# Patient Record
Sex: Male | Born: 1945 | Race: White | Hispanic: No | Marital: Married | Smoking: Former smoker
Health system: Southern US, Community
[De-identification: ages and names within clinical notes are randomized; demographics above are authoritative.]

## PROBLEM LIST (undated history)

## (undated) DIAGNOSIS — I2510 Atherosclerotic heart disease of native coronary artery without angina pectoris: Secondary | ICD-10-CM

## (undated) DIAGNOSIS — E785 Hyperlipidemia, unspecified: Secondary | ICD-10-CM

## (undated) DIAGNOSIS — I10 Essential (primary) hypertension: Secondary | ICD-10-CM

## (undated) DIAGNOSIS — I6529 Occlusion and stenosis of unspecified carotid artery: Secondary | ICD-10-CM

## (undated) DIAGNOSIS — G4733 Obstructive sleep apnea (adult) (pediatric): Secondary | ICD-10-CM

## (undated) HISTORY — PX: CORONARY ARTERY BYPASS GRAFT: SHX141

## (undated) HISTORY — DX: Essential (primary) hypertension: I10

## (undated) HISTORY — DX: Hyperlipidemia, unspecified: E78.5

## (undated) HISTORY — DX: Obstructive sleep apnea (adult) (pediatric): G47.33

## (undated) HISTORY — DX: Occlusion and stenosis of unspecified carotid artery: I65.29

## (undated) HISTORY — DX: Atherosclerotic heart disease of native coronary artery without angina pectoris: I25.10

---

## 1999-08-26 ENCOUNTER — Encounter: Payer: Self-pay | Admitting: *Deleted

## 1999-08-26 ENCOUNTER — Inpatient Hospital Stay (HOSPITAL_COMMUNITY): Admission: EM | Admit: 1999-08-26 | Discharge: 1999-09-03 | Payer: Self-pay | Admitting: Emergency Medicine

## 1999-08-28 ENCOUNTER — Encounter: Payer: Self-pay | Admitting: *Deleted

## 1999-08-29 ENCOUNTER — Encounter: Payer: Self-pay | Admitting: Cardiothoracic Surgery

## 1999-08-30 ENCOUNTER — Encounter: Payer: Self-pay | Admitting: Cardiothoracic Surgery

## 1999-08-31 ENCOUNTER — Encounter: Payer: Self-pay | Admitting: Cardiothoracic Surgery

## 1999-09-01 ENCOUNTER — Encounter: Payer: Self-pay | Admitting: Cardiothoracic Surgery

## 1999-09-24 ENCOUNTER — Encounter (HOSPITAL_COMMUNITY): Admission: RE | Admit: 1999-09-24 | Discharge: 1999-12-23 | Payer: Self-pay | Admitting: Interventional Cardiology

## 2001-01-21 ENCOUNTER — Inpatient Hospital Stay (HOSPITAL_COMMUNITY): Admission: EM | Admit: 2001-01-21 | Discharge: 2001-01-22 | Payer: Self-pay | Admitting: Emergency Medicine

## 2001-02-11 ENCOUNTER — Ambulatory Visit (HOSPITAL_COMMUNITY): Admission: RE | Admit: 2001-02-11 | Discharge: 2001-02-11 | Payer: Self-pay | Admitting: *Deleted

## 2001-02-15 ENCOUNTER — Ambulatory Visit (HOSPITAL_COMMUNITY): Admission: RE | Admit: 2001-02-15 | Discharge: 2001-02-16 | Payer: Self-pay | Admitting: Interventional Cardiology

## 2003-05-04 ENCOUNTER — Ambulatory Visit (HOSPITAL_COMMUNITY): Admission: RE | Admit: 2003-05-04 | Discharge: 2003-05-04 | Payer: Self-pay | Admitting: *Deleted

## 2007-09-30 HISTORY — PX: CERVICAL DISC SURGERY: SHX588

## 2008-06-06 ENCOUNTER — Inpatient Hospital Stay (HOSPITAL_COMMUNITY): Admission: RE | Admit: 2008-06-06 | Discharge: 2008-06-07 | Payer: Self-pay | Admitting: Orthopedic Surgery

## 2008-06-06 ENCOUNTER — Encounter (INDEPENDENT_AMBULATORY_CARE_PROVIDER_SITE_OTHER): Payer: Self-pay | Admitting: Orthopedic Surgery

## 2011-02-11 NOTE — Op Note (Signed)
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   POSTOPERATIVE DIAGNOSIS:  Lumbar spondylosis/stenosis C5-6, C6-7 with  neck pain and right brachialgia.   OPERATIVE PROCEDURE:  Anterior decompression and fusion C5-6 and C6-7,  insertion of interbody cages with autograft [id number] and [id number], application  of titanium plate and screws [id number], C6-7; harvest left autogenous iliac  crest bone graft (anterior).   OPERATIVE NOTE:  The patient was placed under general endotracheal  anesthesia.  Intravenous antibiotics were infused prophylactically.  He  was positioned supine on a Jackson table.  The small bump was placed  under the shoulder blades to facilitate extension of the neck.  The head  was supported on foam donut and foam slab.  The arms were tucked at the  sides.  Hair was clipped from the lower abdomen on the left side.  Tincture of Benzoin was applied to the tops of both shoulders and taped,  then attached and anchored distally to the table to depress both  shoulders.  This was done to facilitate cross-table lateral radiographs.   The left anterior iliac crest and anterior cervical spine were prepped  with DuraPrep and draped in sterile fashion.  The drapes were secured  with strips of Ioban.   A needle was taped to the skin of the cervical spine and a cross-table  lateral radiograph taken.  While waiting for the radiograph to be  processed, we harvested the left anterior iliac crest graft.  A short  incision was made  just distal to the iliac crest about 2.5 inches  posterior to the posterior superior iliac spine.  The external oblique  insertion was identified into the iliac crest.  It was mobilized  medially.  Taylor retractor was placed medially and the periosteum  elevated laterally and another Taylor retractor placed laterally  essentially just the superior surface of the iliac crest was exposed.  Small gouge was used to fenestrate the crest and then the gouge used to  harvest a moderate quantity of bone graft, which was noted to be softer  than expected.  The bony defect was then packed with Gelfoam to stop  bone bleeding.   By this time, a radiograph had been developed and the incision was  appropriately positioned distal to the level marked by the needle, which  had been applied to the skin.  A transverse incision was made from  midline to the left.  It extended laterally to the anterior edge of the  sternocleidomastoid on the left side.  The platysma had separated in the  midline and had migrated laterally somewhat.  It was transected  in line  with the incision.  Deep cervical fascia was identified and bluntly  dissected down onto the anterior aspect of the spinal column.  The  carotid sheath was palpated laterally.   It what we proceed probably the C5-6 disk, the longus colli muscle was  mobilized and a spinal needle bent in Z-fashion was inserted and a cross-  table lateral radiograph taken, which showed it to be at C5-6.  We then  continued with mobilization of the longus coli muscle bilaterally from  the body of C7 distally and the body of C5 proximally.   A shadow line retractor was then placed at C5-6.  Caspar distraction  pins were placed above and below the distracting apparatus applied and a  small amount of tension placed on the disk.  The annulus was incised in  rectangular fashion and then the disk further enucleated using a  combination of Karlin curettes and rongeurs.    Posteriorly, we burred away the posterior superior corner of C6 and the  posterior inferior corner of C5.  A 4-0 curette was used to separate  annular fibers from the posterior aspect of both disks.  This allowed [place]  to place a 2-mm Kerrison rongeur in that plane to remove a little more  bone and then proceed into the neuroforamen laterally, particularly on  the right side, the symptomatic side.  The disk was inextricably bound  to the posterior longitudinal ligament, so that no separate plane was  ever identified.  However, we did decompressed back to the dura and  removed disk tissue from left to right and particular in the right  neuroforamen.  When palpation of the neuroforamen revealed no impediment  to the admission of a nerve hook, decompressive efforts were ceased.  A  small amount of bleeding was controlled by placing Gelfoam in the  diskectomy defect and leaving it for a few minutes.   We then chose a 6-mm cage which was packed with bone and inserted.  The  Caspar distraction device was released and the vertebral endplates  gripped the cage.   Retractors were then removed as were the Caspar pins and we moved to the  C6-7 level.  Shadow line retractor was placed to retract the longus coli  muscles bilaterally and the Caspar pins were then placed, using the  previously made hole at C6.  Again, anterior disk was incised and the  disk enucleated.  At C6-7, there was quite a lot of disk protruding  posterior to the posterior margins of the vertebra.  This was mobilized  really tediously, again separating soft tissue from bone using a small  angled curette.  Eventually, we cleared all of the annulus and adherent  posterior longitudinal ligament from left to right and the dura was well  decompressed.  There was a great deal of disks superiorly, presumably  confined by the posterior longitudinal ligament.  This was curetted  away.  Once again a meticulous foraminotomy was carried out  and  decompressive measures not ceased until I felt the neuroforamen was well  decompressed, particularly on the right side.  However, it was palpated  on the left as it was at C5-6 and it was felt to be adequately  decompressed there too.  An 8-mm cage was then chosen, packed with bone  graft and put in place.   We then applied a 42-mm titanium plate and attached it with 5 screws.  At this point, I noted that the vertebral body  bone was extremely soft  and had to used two rescue screws, one at the distal plate on the right  side (C7) and one proximally on the left side at C5.  The left C6 screw  stripped out and the hole was not amenable to the use of even a rescue  screw.   Cross-table lateral radiograph was taken, which showed good position of  the screws at C5 and C6, but the C7 level could not really be adequately  evaluated due to the shoulders overshadowing, which is not unusual in  this circumstance.   We then closed the subcutaneous layer of the bone graft harvest wound  with 0 Vicryl suture in inverted interrupted fashion.  The skin was  closed using continuous 3-0 undyed Vicryl in continuous fashion.  The  platysma was closed using inverted 0 Vicryl suture in interrupted  fashion.  The skin was closed using a running subcuticular 3-0 undyed  Vicryl.  The skin edges were reinforced with Steri-Strips.  Prior to  closing, a small Silastic drain was placed in the prevertebral area and  attached to a Jackson-Pratt type of reservoir.   Nonadherent antibiotic ointment dressing was applied to the iliac crest  wound.  A plain gauze dressing was applied to the cervical wound held in  place with a green towel.   At the time of dictation, the patient was not awakened and is in the  process of being awakened and transferred to his bed.  Therefore, no  neurologic examination is reported here.   The sponge and needle counts were correct.  There were no intraoperative   complications.      Dimas Alexandria, MD  Electronically Signed     MT/MEDQ  D:  06/06/2008  T:  06/07/2008  Job:  [id number]

## 2011-02-14 NOTE — Cardiovascular Report (Signed)
[place]. [facility]  Patient:    Erik Perez, Erik Perez                  MRN: [id number] Proc. Date: 02/11/01 Adm. Date:  [id number] Disc. Date: [id number] Attending:  Rutherford Limerick CC:         Priscille Heidelberg. Little, M.D.   Cardiac Catheterization  REFERRING PHYSICIAN:  Priscille Heidelberg. Little, M.D.  INDICATIONS:  Abnormal Cardiolite.  BRIEF HISTORY:  Erik Perez is a 65 year old gentleman, status post coronary artery bypass surgery in December 2000.  At that time, he underwent a left internal mammary to the left anterior descending coronary artery, right internal mammary to the right coronary artery, saphenous vein graft to D-1, saphenous vein graft to D-2.  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  On his Cardiolite, he exercised for 8 minutes and 1 second utilizing a full Bruce protocol.  The baseline ECG revealed normal sinus rhythm with normal R-wave progression.  He had 1 mm horizontal ST depression.  His exercise tolerance had decreased from 9 minutes and 44 seconds down to 8 minutes and 1 second. The spect images revealed a decreased uptake from the mid vessel approaching the apex and involving the inferior wall.  There was reverse redistribution noted.  The calculated ejection fraction had decreased from 55% down to 40%.  DESCRIPTION OF PROCEDURE:  After obtaining written informed consent, the patient was brought to the cardiac catheterization laboratory in the postabsorptive state.  Preoperative sedation was achieved using IV Versed. The right groin was prepped and draped in the usual sterile fashion.  Local anesthesia was achieved using 1% Xylocaine.  A 6-French hemostasis sheath was placed into the right femoral artery using the modified Seldinger technique. Selective coronary angiography was performed using JL-4 and JR-4 catheters. The saphenous vein grafts were engaged using a JL-4.   The left internal mammary artery was engaged using a JR-4.  Access to the right internal mammary artery was very tedious.  Exchange was made over guide wire to get the right groin area catheter up.  Unable to access the LIMA or the RIMA with the JR-4 LIMA or a no-torque catheter.  Multiple attempts were made.  Subclavian shots were made.  The ostium was identified, however, engagement of the Foyil was not made.  FINDINGS:  PRESSURES:  The aortic pressure was 134/80.  LV pressure 127/60.  EDP 10.  SINGLE-PLANE VENTRICULOGRAM:  Revealed anteroapical hypokinesis.  Ejection fraction was 50%.  CORONARY ARTERIES: 1. The left main coronary artery trifurcates into the left anterior descending    coronary artery, ramus, and circumflex vessels.  There was no significant    disease in the left main coronary artery.  2. The left anterior descending coronary artery was 100% occluded after the    first septal perforator.  The ramus was a small vessel and had no    significant disease.  3. The circumflex vessel was a large vessel.  It gave rise to several small    obtuse marginals and supplied the lateral portion of the myocardium.  There    was a long 70% proximal lesion in the circumflex.  There was poststenotic    dilatation noted.  4. The right coronary artery was dominant for the inferior circulation.  There    was a long proximal lesion up to 70% in the proximal right coronary artery.    There moderate sized PDA and PL branch  had no significant disease.  GRAFT ANGIOGRAPHY: 1. The saphenous vein graft to the second diagonal had good flow.  There was a    30% ostial stenosis.  There was retrograde flow back to the first diagonal    as well.  2. The saphenous vein graft to the first diagonal was patent, with good distal    runoff.  3. The left internal mammary artery was a small to moderate caliber vessel.    There was reverse flow back to the second diagonal noted from the left     internal mammary artery.  There was no significant disease noted in the    left internal mammary artery, however, there was contrast hang-up in the    system noted.  4. The right internal mammary artery was not engaged despite several attempts    using a JR-4 LIMA catheter and a no-torque catheter.  Injection into the    subclavian artery revealed that the ostium of the RIMA was patent, however,    distal runoff could not be noted.  The single-plane ventriculogram revealed anteroapical hypokinesis.  Ejection fraction was approximately 50%.  FINAL IMPRESSION: 1. As critical disease involving the proximal circumflex and proximal right,    the circumflex was unrevascularized at the time of surgery.  The right    internal mammary artery could not be engaged. 2. As there is preserved left ventricular function, the films will be reviewed    with Dr. Tamala Julian and further recommendations will be made pending the outcome    of the review. DD:  02/11/01 TD:  02/11/01 Job: [id number]

## 2011-02-14 NOTE — Cardiovascular Report (Signed)
[place]. [facility]  Patient:    Erik Perez                     MRN: [id number] Proc. Date: 08/27/99 Adm. Date:  [id number] Attending:  Rutherford Limerick CC:         Priscille Heidelberg. Little, M.D.             Jerrye Beavers, M.D., [facility]             CVTS.                        Cardiac Catheterization  CINE NUMBER:  [id number].  PROCEDURE PERFORMED:  Percutaneous transluminal coronary angioplasty of the proximal and mid left anterior descending.  DESCRIPTION OF PROCEDURE:  After informed consent, and following diagnostic cardiac catheterization performed by Fabio Asa, M.D., the patient underwent angioplasty using the 7-French sheath that was placed during the diagnostic procedure.  The LAD had been found to be totally occluded.  We used the 7-French #4 left Judkins guiding catheter, a BMW 190-cm long wire and an ACT was documented to be 247 seconds.  IV integrilin double bolus and infusion were also begun.  We were able to cross the stenosis with the BMW wire without much difficulty. low was reestablished with the guidewire.  It was noted at that time that there were multiple lesions in the LAD, including the proximal lesion and the mid lesion with diffuse disease.  Angioplasty was performed with a 3.0 20-mm long Adante balloon. Multiple inflations were performed and a dissection occurred in the mid lesion nd also in the proximal lesion, just beyond the first septal perforator.  We then attempted to stent with a 25-mm long NIR Royale stent that would not cross the stenosis.  We then placed a buddy wire using a 300-cm long luge wire and again attempted to cross but were unsuccessful, and due in part to poor guiding catheters.  We then switched over to a 15-mm long 3.0-mm S-670 stent, which was  also unable to cross into the mid-vessel.  After attempting stenting, we decided to terminate the procedure, as the patient still had patency of  the LAD.  Another series of balloon inflations were performed, each for two units in the mid and proximal vessel.  Patient left the cath lab with a patent LAD but dissections proximally, just distal to the first septal perforator, and also in the mid-vessel. Antegrade flow was TIMI grade 3.  Patient left the catheterization lab symptomatic, with no ST-T wave changes.  CONCLUSIONS:  Successful percutaneous transluminal coronary angioplasty of the eft anterior descending with resolution of ischemic chest pain and ST-T wave changes. Unable to stent the vessel due to diffuse extensive left anterior descending disease.  PLAN:  Coronary artery bypass graft.  CVTS is consulted.  Hopefully, surgery can be done electively in several days after the patient has opportunity to recover some from this acute anterior wall infarction. DD:  08/27/99 TD:  08/28/99 Job: [id number]

## 2011-02-14 NOTE — Op Note (Signed)
[place]. [facility]  Patient:    Erik Perez                     MRN: [id number] Proc. Date: 08/29/99 Adm. Date:  [id number] Attending:  Len Childs CC:         CVTS office             Lu Duffel, M.D.             at [facility] Card[facility]                           Operative Report  PREOPERATIVE DIAGNOSIS:  Severe two vessel coronary artery disease, status post  acute anterior wall myocardial infarction with emergency angioplasty of an occluded left anterior descending coronary artery with residual dissection, class IV unstable angina.  POSTOPERATIVE DIAGNOSIS:  Severe two vessel coronary artery disease, status post acute anterior wall myocardial infarction with emergency angioplasty of an occluded left anterior descending coronary artery with residual dissection, class IV unstable angina.  OPERATION:  Coronary artery bypass grafting x 4 (left internal mammary artery to LAD, right internal mammary artery to RCA, saphenous vein graft to first diagonal, saphenous vein graft to second diagonal).  SURGEON:  Len Childs, M.D.  ASSISTANT:  Earnstine Regal, P.A.C.  ANESTHESIOLOGIST:  Jessy Oto. Albertina Parr, M.D.  ANESTHESIA:  General.  INDICATION:  The patient is a 65 year old white male who presented with unstable angina.  While in the hospital, he developed acute anterior MI and was taken emergently to the catheter lab where the LAD was opened with angioplasty.  He had a 95% stenosis of the right coronary artery.  He was stabilized on anticoagulant medication and referred for surgical revascularization.  Prior to the operation, the patient was examined in his CC room and the results of his cardiac catheterization were discussed with the patient and family.  The indications and expected benefits of coronary artery bypass grafting were discussed.  I discussed the details of the operation including the choice of conduit  to include bilateral internal mammary artery graft due to his age, use of cardiopulmonary bypass, and general anesthesia and expected postoperative recovery.  I discussed with the family and patient the specific risks of the operation including the risks of MI, CVA, bleeding, infection, death.  He understood the implication for surgery and agreed to proceed with the operation as planned under informed consent.  FINDINGS:  Both mammary arteries were good conduits.  The saphenous vein was moderately dilated but adequate.  The LAD was 0 proximal stenosis and the vessel had some dissection continuing into the first diagonal.  The anterior apical LV  showed a recent hemorrhage infarct approximately 56 hours old consistent with his clinical history.  Platelets were given at the end of the bypass.  Due to low platelet count and intraoperative use of platelet inhibitors.  A protean was not given.  DESCRIPTION OF PROCEDURE:  The patient was brought to the operating room and placed supine on the operating table where general anesthesia was induced under invasive hemodynamic monitoring.  The chest, abdomen, and legs were prepped with Betadine and draped as a sterile field.  A median sternotomy was formed and a saphenous ein was harvested from the right lower extremity.  Both internal mammary arteries were harvested as pedicle grafts from internal mammary artery origin at the subclavian vessels.  Each had excellent flow and were good  conduits for grafting.  Heparin was administered and the ACT was documented as therapeutic.  The patient was cannulated, placed on bypass, and cooled to 32 degrees.  The coronaries were dissected and the mammary arteries and vein grafts were prepared for the distal anastomoses.  A cardioplegia cannula was placed and the patient was cooled to 28 degrees. The aortic cross clamp was applied and 700 cc of cold blood cardioplegia was delivered to the  aortic root with immediate cardioplegic arrest and septal temperature dropping less than 12 degrees.  Topical ice saline slush was used to augment myocardial preservation and the pericardial insulator pad to protect the left phrenic nerve.  The distal coronary anastomoses were then performed.  The distal anastomosis was to the second diagonal.  This was a 1.5 mm vessel with proximal 80% stenosis and a  reverse saphenous vein was sewn end-to-side with running 7-0 Prolene.  The second distal anastomosis was to the first diagonal which is a smaller 1.2 mm vessel with proximal 90% stenosis and some intimal dissection extending into the vessel from the LAD.  A reverse saphenous vein was sewn end-to-side with a running 7-0 Prolene and there was good flow through the graft.  Cardioplegia was then redosed.  The third distal anastomosis was to the distal right coronary artery which was  sclerotic 1.8 mm vessel, proximal 95% stenosis.  The right internal mammary artery pedicle was brought through an opening created in the right lateral pericardium and was brought down on the right coronary artery and sewn end-to-side with an 8-0 Prolene.  There was excellent flow through the anastomosis.  The pedicle was secured to the epicardium with interrupted 6-0 Prolene.  The fourth distal anastomosis was to the distal LAD after the takeoff of the second diagonal.  The left internal mammary artery pedicle was brought through an opening created in the left side of the pericardium.  It was brought down on the 1.5 mm  diameter LAD and sewn end-to-side with a running 8-0 Prolene.  There was excellent flow through the anastomosis with immediate rise of septal temperature after release of the pedicle clamps on both mammary arteries.  The left mammary pedicle was secured to the epicardium and the aortic cross clamp was removed.  The heart was cardioverted back to a regular rhythm.  A partial occluding  clamp was placed on the ascending aorta and two proximal vein anastomoses were performed using a running 6-0 Prolene.  The partial occluding clamp was  removed and all grafts were opened and perfused.  Hemostasis was documented at he proximal and distal sites.  The patient was rewarmed to 37 degrees and temporary pacing wires were applied.  The patient was then weaned from cardiopulmonary bypass without difficulty with  excellent hemodynamics and stable blood pressure.  Protamine was administered and the cannulae were removed.  The mediastinum was irrigated with warm antibiotic irrigation and the leg incision was irrigated and closed in a standard fashion.  The pericardium was closed superiorly over the aorta and vein grafts and chest tubes were placed to drain both pleural spaces as well as the mediastinum.  The sternum was reapproximated with steel wire and the pectoralis fascia and subcutaneous layers were closed with running Vicryl.  The skin was closed with  subcuticular and sterile dressings were applied.  Total cardiopulmonary bypass ime was 115 minutes with aortic cross clamp time of 55 minutes. DD:  08/29/99 TD:  08/31/99 Job: [id number]

## 2011-02-14 NOTE — H&P (Signed)
[place]. [facility]  Patient:    Erik Perez, Erik Perez                  MRN: [id number] Adm. Date:  [id number] Attending:  Belva Crome. Iii Dictator:   Julianne Handler, N.P. CC:         Priscille Heidelberg. Little, M.D.  Fabio Asa, M.D.  Len Childs, M.D.   History and Physical  DATE OF BIRTH:  09-14-1946  PHYSICIANS: 1. Primary care [place]:  Priscille Heidelberg. Little, M.D. 2. Primary care cardiologist:  Fabio Asa, M.D. 3. Interventionist:  Illene Labrador, M.D. 4. CVTS surgeon:  Len Childs, M.D.  PROBLEMS: 1. Coronary atherosclerotic heart disease.    a. (Feb 11, 2001) Diagnostic coronary angiography revealing critical       disease involving proximal circumflex, proximal right.  The circumflex       was unrevascularized at the time of surgery.  The right internal mammary       artery could not be engaged.  Preserved left ventricular function with       ejection fraction of 50%.  Anterior apical hypokinesis.  Details:       Native coronary arteries:  Left main - no significant disease.  LAD -       100% after first septal perforator.  Circumflex - long 70% proximal.       Post stenotic dilation.  RCA - long proximal 70%.  Moderate-sized PDA       and PL branch without significant disease.  Bypass graft angiography:       SVG to diagonal two 30% ostial stenosis.  Retrograde flow back to the       first diagonal.  SVG to diagonal one patent.  LIMA to the LAD - patent.       Right internal mammary artery unable to engage.  Injection into the       subclavian artery revealed that the ostium of the RIMA was patent.       However, distal runoff could not be noted.    b. (Feb 08, 2001) Stress Cardiolite revealing ejection fraction 55% with       ischemia anterior, anteroapical, and inferior wall.  Ejection fraction       50%. Anterior hypokinesis.    c. (December 2000) Coronary artery bypass grafting x 4 with left internal       mammary artery  to the left anterior descending artery, right internal       mammary artery to the right coronary artery, saphenous vein graft to       diagonal one, saphenous vein graft to diagonal two.  This was an       emergent coronary artery bypass grafting following acute anterior       myocardial infarction and percutaneous intervention.    d. (August 27, 1999) Percutaneous transluminal coronary angioplasty of       proximal and mid left anterior descending artery (prior to coronary       artery bypass grafting). 2. Dislipidemia; Zocor. 3. Mild obesity. 4. Hypertension.  PLAN:  Coronary angiography with anticipated intervention circumflex lesion. Risks, potential complications, benefits, and alternative to procedure discussed in detail.  Patient indicates questions and concerns have been addressed and is agreeable to proceed.  HISTORY OF PRESENT ILLNESS:  Mr. Natnael Eppinger is a very pleasant 65 year old gentleman with history of acute anterior myocardial infarction and subsequent PTCA of the  proximal and mid LAD and, closely following, CABG x 4 as above.  He also has a history of dislipidemia, mild obesity.  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 Follow-up cardiac catheterization Feb 11, 2001 revealed critical disease involving the proximal circumflex and proximal right coronary artery.  The circumflex was unrevascularized at the time of surgery.  The right internal mammary artery could not be engaged.  EF was preserved.  Patient now presents for follow-up anticipated intervention on the circumflex artery.  ALLERGIES:  SHRIMP SCAMPI, causing nausea.  MEDICATIONS: 1. Toprol-XL 25 mg p.o. q.d. 2. Altace 2.5 mg p.o. q.d. 3. Zocor 20 mg p.o. q.d. 4. Enteric-coated aspirin 325 mg p.o. q.d. 5. Nitroglycerin tablet p.r.n. chest pain (none taken). 6. Niaspan 500 mg  p.o. q.d. for four weeks.  Anticipate increasing of this    medication if LFTs/fasting statin profile indicate.  SOCIAL HISTORY AND HABITS:  Tobacco:  Quit in 1982, prior one pack-per-day of 20 years.  ETOH:  Two cocktails per week but none for three weeks.  Patient is married (second marriage) for 18 years.  He has two sons from prior marriage who are alive and well.  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  FAMILY HISTORY:  Father deceased age 64 of MI; prior mild stroke x 2.  Mother deceased age 46 status post MVA.  She previously survived breast cancer which was in remission.  One brother deceased age 62 of suicide.  REVIEW OF SYSTEMS:  As HPI/previous medical history.  Otherwise, denies problems with lightheadedness, dizziness, syncope, nor near syncopal episodes. Denies problems with hearing loss.  Does wear glasses.  Episode of GERD and dehydration necessitating hospital admission for IV fluid rehydration recently.  Otherwise, no problems with reflux.  Denies constipation, diarrhea, melena, nor bright red blood PR.  Negative dysuria nor hematuria.  No arthritic-type complaints.  Negative pedal edema, orthopnea, PND.  PHYSICAL EXAMINATION:  VITAL SIGNS:  Blood pressure 141/85, heart rate 82 and regular, respiratory rate 16, temperature 97.6.  Height 6 feet 0 inches, weight 195 pounds.  GENERAL:  He is a well-nourished middle-aged gentleman in no apparent distress.  His wife is in attendance.  HEENT:  Brisk bilateral carotid upstroke without bruit.  No significant JVD, no thyromegaly.  CARDIAC:  Regular rate and rhythm without murmur, rub, nor gallop. Normal S1 and S2.  CHEST:  Lung sounds clear with equal bilateral excursion.  Negative CPA tenderness.  ABDOMEN:  Obese, soft, normoactive bowel sounds.  Negative abdominal aorta,  renal, nor femoral bruit.  He does have a right femoral groin  bruise status post catheterization Feb 11, 2001.  No bruit in that groin area.  PULSES:  Distal pulses intact.  EXTREMITIES:  Negative pedal edema.  NEUROLOGIC:  Cranial nerves 2-12 grossly intact.  Alert and oriented x 3.  GENITAL/RECTAL:  Deferred.  LABORATORY TESTS AND DATA:  From Feb 10, 2001 - Sodium 141, K 4.2, chloride 104, CO2 31, BUN 13, creatinine 1.3, glucose 97.  LFTs within normal range. Hemoglobin 14.2, hematocrit 39.8, WBC 5.1, platelets 171.  PT 11.3 with INR 0.91, PTT 31.  EKG from Feb 08, 2001 revealed NSR at 81 beats per minute with "old" anterior MI.  Nonischemic.  Chest x-ray from January 24, 2001 revealed no active disease. DD:  02/16/01 TD:  02/16/01 Job:  [id number]

## 2011-02-14 NOTE — Discharge Summary (Signed)
[place]. [facility]  Patient:    Erik Perez                     MRN: [id number] Adm. Date:  [id number] Disc. Date: 09/03/99 Attending:  Len Childs Dictator:   Marcellus Scott, P.A. CC:         Len Childs, M.D.             Fabio Asa, M.D.             Priscille Heidelberg Little, M.D.                           Discharge Summary  DATE OF BIRTH:  [birth year]  FINAL DIAGNOSES: 1. Acute anterior wall myocardial infarction on admission with emergent    percutaneous transluminal coronary angioplasty of an occluded left anterior    descending with dissection and suboptimal result from the procedure. 2. Class IV unstable angina. 3. Severe two vessel atherosclerotic coronary artery disease.  SECONDARY DIAGNOSES: 1. Hypertension. 2. Obesity. 3. Adverse lipid profile. 4. History of tobacco habituation, over a 20-year period, then quit in 1982. 5. History of kidney nephrolithiasis with basket retrieval of the stone.  PROCEDURES: 1. August 27, 1999, emergent left heart catheterization with percutaneous    transluminal coronary angioplasty of the left anterior descending coronary    artery, Dr. Fabio Asa.  The study showed a bifurcating left main.  The left    anterior descending coronary artery had 100% occlusion after the first septal    perforator.  The left circumflex gave rise to multiple small obtuse marginals    without severe disease.  The right coronary artery was dominant, with severe    occlusive disease, once again suboptimal PTCA of the LAD. 2. August 29, 1999, coronary artery bypass graft surgery x 4, Dr. Tharon Aquas Trigt.  In this procedure, the left internal mammary artery was connected in    an end-to-side fashion to the left anterior descending coronary artery.  The  right internal mammary artery was connected in an end-to-side fashion to the    right coronary artery.  A reverse saphenous vein graft was fashioned  from the    aorta to the first diagonal, and a reverse saphenous vein graft was fashioned    from the aorta to the second diagonal.  The patient was transferred to the    intensive care unit in stable and satisfactory condition.  DISPOSITION:  Erik Perez is judged a suitable candidate for discharge to home on postoperative day #5.  He has done well postoperatively.  He has remained afebrile. He has experienced no cardiac dysrhythmias, no pulmonary compromise.  His incisions are healing well.  He is ambulating independently.  His appetite has been slowly returning.  He has full GI tract function.  His postoperative laboratories are within normal limits.  He will go home on the following medications.  DISCHARGE MEDICATIONS: 1. Percocet 5/325 1-2 tablets p.o. q.4-6h. p.r.n. pain. 2. Altace 2.5 mg daily. 3. Multivitamin with zinc 1 daily. 4. Prevacid 30 mg daily. 5. Lopressor 25 mg b.i.d. 6. Enteric aspirin 325 mg daily.  ACTIVITY:  Ambulate as tolerated.  He is asked not to drive for two weeks, and e is not to lift any weight more than 10 pounds for six weeks.  DIET:  Low sodium, low cholesterol.  WOUND CARE:  He may shower  daily, keep his incisions clean and dry.  FOLLOW-UP:  He will see Dr. Fabio Asa in two weeks.  He is to call her office to arrange the appointment.  A chest x-ray will be taken at that time.  He also has an appointment with Dr. Tharon Aquas Trigt for Friday, September 27, 1999, at 10 oclock in the morning.  He is asked to bring the chest x-ray to that appointment.  HISTORY OF PRESENT ILLNESS:  Erik Perez is a 65 year old male with a  history of hypertension, mild obesity, and borderline dyslipidemia.  He developed mid to lower anterior chest tightness with associated shortness of breath while  walking a dog on the evening of August 25, 1999.  The discomfort lasted approximately 5-10 minutes, and was relieved with rest.  He had a  repeat episode on the morning of August 26, 1999, as he was walking from his car to his work office.  He visited his primary care clinic, where EKG showed a picture of nonischemic changes.  He was, however, sent to [facility] by ay of the emergency medical services for further evaluation.  The patient is without prior history of chest pain.  He has a remote history of anterior chest discomfort when playing vigorous sports.  As of late, he had been doing yard work without problems.  HOSPITAL COURSE:  Erik Perez was admitted to [facility] on  August 26, 1999, where he had episodes of recurrent chest pain.  He was placed on IV nitroglycerin, IV heparin after an electrocardiogram showed J-point elevation and ST elevations.  He was taken for emergent left heart catheterization on August 27, 1999, with the finding of an occlusion to the left anterior descending coronary artery after a large first septal perforator.  PTCA was done with a suboptimal result and dissection of the LAD extending into the first diagonal. The surgeons of Cardiovascular and [place] were contacted for this patient, in the setting of acute myocardial infarction with dissection to he left anterior descending coronary artery.  He was maintained on IV heparin and nitroglycerin, and taken to the operating room on August 29, 1999.  Four bypasses were placed, as previously described.  The patient tolerated the procedure well, and was transferred in stable and satisfactory condition to the recovery room. He was extubated on the day of surgery.  He was transfused with one unit of packed red blood cells for postoperative anemia with a hemoglobin of 8.1.  On postoperative day #1, he was afebrile, achieving 100% oxygen saturations on four liters of nasal cannula.  His creatinine postoperatively was 1.0.  His hematocrit after transfusion was 28%.   He was started on a beta blocker.  On postoperative day #2, he was  achieving 96% oxygen saturations on room air.  His creatinine was 1.0.  He was ambulating, and had positive bowel function.  On postoperative day #3, he still had fluid overload with excess of 12 pounds over his preoperative weight.  Chest x-ray showed small bilateral pleural effusions.  He was achieving 97% oxygen saturations on room air, and he was diuresed gently.  On postoperative day #4, he had lost our pounds to diuresis overnight.  His incisions were healing well.  Diuresis was continued with another small dose of Lasix, and he was started on Altace 2.5 mg  daily.  He was judged a suitable candidate for discharge to home on postoperative day #5, with follow-up with Dr. Jaci Standard  and Dr. Prescott Gum, as dictated, with the medications as assigned here. DD:  09/02/99 TD:  09/03/99 Job: [id number]

## 2011-02-14 NOTE — H&P (Signed)
[place]. [facility]  Patient:    Erik Perez                     MRN: [id number] Adm. Date:  [id number] Attending:  Fabio Asa A Dictator:   Julianne Handler, N.P. CC:         Priscille Heidelberg. Little, M.D.             Fabio Asa, M.D.                         History and Physical  SUBJECTIVE:  [name] is a pleasant 65 year old with history of hypertension, mild obesity, and borderline dyslipidemia, who developed mid/lower anterior chest tightness with associated shortness of breath while walking the dog last evening. The discomfort lasted approximately 5 to 10 minutes and was relieved with rest. He had a repeat episode of this as he was walking from his car to his work office. He went to his primary M.D. clinic where an EKG was nonischemic.  He was transported via EMS to [facility] for further evaluation.  The patient is without prior history of chest pain recently; he has a remote history of anterior chest discomfort when playing vigorous sports.  As of late, he was doing yard work without problems.  No history of orthopnea, PND, nor pedal edema.  Cardiac Risk Factors: Age, male, obesity, hypertension, plus/minus dyslipidemia.  PAST MEDICAL HISTORY: 1. Hypertension. 2. Mild obesity.  The patient denies history of asthma, peptic ulcer disease, diabetes mellitus, thyroid disease, and cancer.  PAST SURGICAL HISTORY:   Basket retrieval for kidney stones, tonsillectomy.  ALLERGIES:  SHRIMP SCAMPI causes nausea, though he is okay with eating other shrimp products and shell fish.  MEDICATIONS: 1. Corgard 40 mg p.o. q.d. 2. Multivitamin p.o. q.d. 3. Baby aspirin 1 daily. 4. Vitamin E 1000 units q.d.  SOCIAL HISTORY AND HABITS:  Tobacco: Quit in 1982, prior one pack per day for 20 years.  ETOH: Two cocktails a weeks.  The patient is married (second marriage) or 18 years.  He has two sons from a prior marriage who are alive and well.   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  FAMILY HISTORY:  Father deceased at age 43 of myocardial infarction, prior mild  stroke x 2.  Mother deceased at age 83 status post motor vehicle accident; she ad previously survived breast cancer, remission.  One brother deceased, age 87, of  suicide.  REVIEW OF SYSTEMS:  The patient denies problems with syncope, near syncope, dizziness, or tinnitus.  Negative dysphagia to food or fluid.  Negative palpitations.  Four days ago, he had an episode of GERD with epigastric burning and pressure; he took Mylanta and Zantac with improvement.  Denies melena, bright red blood per rectum, constipation, and diarrhea.  Tendency toward sweats. Negative dysuria or hematuria.  PHYSICAL EXAMINATION:  VITAL SIGNS:  Blood pressure 116/78, heart rate 62 and regular, respiratory rate normal, afebrile.  GENERAL:  He is a well-nourished, mildly obese, middle-aged male, accompanied by his anxious wife.  NECK:  Brisk bilateral carotid upstrokes without bruits.  No JVD nor thyromegaly.  CARDIAC:  Regular rate and rhythm without murmur, rub, or gallop.  Normal S1 and S2.  CHEST:  Lung sounds clear with equal bilateral excursion.  Negative CVA tenderness.  ABDOMEN:  Soft, nondistended.  Normoactive  bowel sounds.   Negative abdominal aortic, renal, and femoral bruit.  Nontender to palpation.  No masses nor organomegaly appreciated.  EXTREMITIES:  Negative pedal edema with +2/4 radial, femoral, dorsalis pedis, and posterior tibial pulses.  NEUROLOGIC:  Cranial nerves II-XII grossly intact.  Alert and oriented x 3.  GENITAL/RECTAL:  Deferred.  LABORATORY TESTS AND DATA:  EKG reveals normal sinus rhythm at 66 beats per minute without ischemic changes.  CBC, CMET, coags, and chest x-ray are pending.  IMPRESSION:  Symptoms of unstable angina in 65 year old with history  of hypertension and borderline dyslipidemia, mild obesity.  PLAN: 1. Admit to telemetry, rule out MI protocol with serial cardiac enzymes,    troponin I, and daily EKG. 2. Pending Dr. Carmelina Noun review, stress Cardiolite or heart catheterization. DD:  08/26/99 TD:  08/26/99 Job: [id number]

## 2011-02-14 NOTE — Cardiovascular Report (Signed)
[place]. [facility]  Patient:    Erik Perez, Erik Perez                  MRN: [id number] Proc. Date: 02/15/01 Adm. Date:  [id number] Attending:  Belva Crome. Iii CC:         [facility] A. Jaci Standard, M.D.             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  The patient has entered the TAXUS trial which is a drug-coated paclitaxel Scimed stent. The patient was randomized to the study protocol.  PROCEDURES PERFORMED: 1. Percutaneous transluminal coronary angioplasty of the proximal circumflex. 2. Stent of the proximal circumflex lesion.  DESCRIPTION OF PROCEDURE:  After informed consent, we inserted a 7 French sheath into the left femoral artery using the the modified Seldinger technique.  We used the left femoral artery because the right femoral contained a significant bruise following the diagnostic procedure last week. After inserting the 7 French sheath, using 2% Xylocaine local, we inserted a 7 French 3.5 Voda guide catheter.  A 0.014, 190 cm, long BMW wire was used to cross the lesion.  We angioplastied with a 3.0 mm diameter, 15 mm long Maverick balloon.  One balloon inflation to 5 atmospheres was performed.  We then deployed a 16 mm long, 3.5 mm TAXUS protocol stent and deployed this about 13 atmospheres.  Two balloon inflations were performed.  Antegrade flow as brisk both pre and postprocedure.  No specific complications are felt to have occurred.  The stented region involved a high-grade lesion starting in the proximal vessel and extending to the mid vessel.  A less than critical 30-40% stenosis beyond this region was left untouched.  CONCLUSIONS:  Successful percutaneous coronary intervention of the circumflex with deployment of a  TAXUS protocol 3.5 mm, 16 mm long stent to an effective diameter 3.75 mm.  The patient tolerated the procedure without complications.  PLAN:  Plavix for six months.  Integrilin for 18 hours.  IV fluids per close clinical observation. DD:  02/15/01 TD:  02/15/01 Job: [id number]

## 2011-02-14 NOTE — Cardiovascular Report (Signed)
[place]. [facility]  Patient:    Erik Perez                     MRN: [id number] Proc. Date: 08/27/99 Adm. Date:  [id number] Attending:  Fabio Asa A CC:         Cardiac Catheterization Laboratory                        Cardiac Catheterization  PROCEDURE PERFORMED:  Left heart catheterization, emergent coronary angioplasty.  CARDIOLOGIST:  Fabio Asa, M.D.  INDICATIONS:  Acute anterior infarction.  Mr. Malakhi Kibbey is a 65 year old gentleman who was admitted to the hospital for observation following intermittent episodes of chest pain since Thursday.  He presented with a normal electrocardiogram and negative cardiac enzymes.  Subsequently early in the morning on August 27, 1999, he developed an acute  episode of chest pain which was unresolved with nitroglycerin and morphine.  He had associated electrocardiogram changes and was brought emergently to the cardiac catheterization laboratory for emergent angioplasty.  DESCRIPTION OF PROCEDURE:  After obtaining an informed consent the patient was brought to the catheterization laboratory on an emergent basis.  The right groin was prepped and draped in the usual sterile fashion.  The right femoral head was identified using radiographic technique.  Selective coronary angiography was performed using a JL4 and JR4 Judkins catheter.  Nonionic contrast was used and was hand injected. The JR4 Judkins catheter was advanced to the LV for pressures. he films were then reviewed with Dr. Illene Labrador III, and it was elected to proceed with an angioplasty of the LAD.  FINDINGS: Aortic pressure:  128/85. LV pressure:  128, with an EDP of 15.  1. Left main coronary artery:  Bifurcated into the left anterior descending and    circumflex vessel.  There was no significant disease in the left main    coronary artery. 2. Left anterior descending coronary artery:  The left anterior descending  gave    rise to a small to moderate septal perforator and then was 100% occluded.    The disease appeared to extend into the proximal LAD, but did not appear    to involve the left main coronary artery. 3. Circumflex coronary artery:  The circumflex vessel gave rise to a moderate    OM-I, then multiple small obtuse marginals, and ended as a lateral branch. 4. Right coronary artery:  The right coronary artery was dominant for the    posterior circulation, giving rise to the PDA and PL branch.  The right    coronary artery was tortuous in its proximal origin and then gave rise to    a 70%-80% proximal stenosis.  IMPRESSION:  Total left anterior descending coronary artery which is the expected infarction artery, and critical disease in the right coronary artery.  PLAN:  We will proceed with a staged procedure involving an angioplasty of the eft anterior descending coronary artery, and bring the patient back at a later date for intervention on the right coronary artery. DD:  08/27/99 TD:  08/27/99 Job: [id number]

## 2011-07-02 LAB — COMPREHENSIVE METABOLIC PANEL WITH GFR
ALT: 41
AST: 32
Albumin: 3.9
Alkaline Phosphatase: 40
BUN: 9
CO2: 29
Calcium: 9.7
Chloride: 110
Creatinine, Ser: 1.05
GFR calc non Af Amer: >60
Glucose, Bld: 102 — ABNORMAL HIGH
Potassium: 4.9
Sodium: 144
Total Bilirubin: 1.6 — ABNORMAL HIGH
Total Protein: 6

## 2011-07-02 LAB — URINE CULTURE: Colony Count: 6000

## 2011-07-02 LAB — DIFFERENTIAL
Basophils Absolute: 0
Basophils Relative: 0
Eosinophils Absolute: 0.1
Eosinophils Relative: 3
Lymphocytes Relative: 23
Lymphs Abs: 1.1
Monocytes Absolute: 0.4
Monocytes Relative: 8
Neutro Abs: 3.1
Neutrophils Relative %: 66

## 2011-07-02 LAB — CBC
HCT: 33.1 — ABNORMAL LOW
HCT: 38.2 — ABNORMAL LOW
Hemoglobin: 11.4 — ABNORMAL LOW
Hemoglobin: 12.9 — ABNORMAL LOW
MCHC: 33.7
MCHC: 34.6
MCV: 96.3
MCV: 97.9
Platelets: 138 — ABNORMAL LOW
RBC: 3.44 — ABNORMAL LOW
RBC: 3.91 — ABNORMAL LOW
RDW: 13.5
WBC: 9.3

## 2011-07-02 LAB — PROTIME-INR
INR: 1
Prothrombin Time: 13.5

## 2011-07-02 LAB — ABO/RH: ABO/RH(D): A POS

## 2011-07-02 LAB — URINALYSIS, ROUTINE W REFLEX MICROSCOPIC
Bilirubin Urine: NEGATIVE
Hgb urine dipstick: NEGATIVE
Ketones, ur: NEGATIVE
Protein, ur: NEGATIVE
Urobilinogen, UA: 0.2

## 2011-07-02 LAB — BASIC METABOLIC PANEL
GFR calc non Af Amer: >60
Potassium: 4.8
Sodium: 140

## 2011-07-02 LAB — TYPE AND SCREEN
ABO/RH(D): A POS
Antibody Screen: NEGATIVE

## 2011-07-02 LAB — VITAMIN D 25 HYDROXY (VIT D DEFICIENCY, FRACTURES): Vit D, 25-Hydroxy: 30 (ref 30–89)

## 2011-07-02 LAB — APTT: aPTT: 28

## 2011-11-12 DIAGNOSIS — H269 Unspecified cataract: Secondary | ICD-10-CM | POA: Diagnosis not present

## 2011-11-24 DIAGNOSIS — H269 Unspecified cataract: Secondary | ICD-10-CM | POA: Diagnosis not present

## 2012-01-15 ENCOUNTER — Other Ambulatory Visit: Payer: Self-pay | Admitting: Gastroenterology

## 2012-01-22 ENCOUNTER — Encounter (INDEPENDENT_AMBULATORY_CARE_PROVIDER_SITE_OTHER): Payer: 59 | Admitting: Ophthalmology

## 2012-01-22 DIAGNOSIS — H35039 Hypertensive retinopathy, unspecified eye: Secondary | ICD-10-CM

## 2012-01-22 DIAGNOSIS — H43819 Vitreous degeneration, unspecified eye: Secondary | ICD-10-CM

## 2012-01-22 DIAGNOSIS — I10 Essential (primary) hypertension: Secondary | ICD-10-CM

## 2012-01-22 DIAGNOSIS — H348392 Tributary (branch) retinal vein occlusion, unspecified eye, stable: Secondary | ICD-10-CM

## 2012-01-26 ENCOUNTER — Encounter (INDEPENDENT_AMBULATORY_CARE_PROVIDER_SITE_OTHER): Payer: 59 | Admitting: Ophthalmology

## 2012-01-27 ENCOUNTER — Other Ambulatory Visit: Payer: Self-pay | Admitting: Family Medicine

## 2012-01-27 DIAGNOSIS — R9389 Abnormal findings on diagnostic imaging of other specified body structures: Secondary | ICD-10-CM

## 2012-01-29 ENCOUNTER — Encounter (INDEPENDENT_AMBULATORY_CARE_PROVIDER_SITE_OTHER): Payer: 59 | Admitting: Ophthalmology

## 2012-01-29 DIAGNOSIS — H348392 Tributary (branch) retinal vein occlusion, unspecified eye, stable: Secondary | ICD-10-CM

## 2012-01-29 DIAGNOSIS — H35039 Hypertensive retinopathy, unspecified eye: Secondary | ICD-10-CM

## 2012-01-29 DIAGNOSIS — I10 Essential (primary) hypertension: Secondary | ICD-10-CM

## 2012-01-29 DIAGNOSIS — H43819 Vitreous degeneration, unspecified eye: Secondary | ICD-10-CM

## 2012-01-30 ENCOUNTER — Ambulatory Visit
Admission: RE | Admit: 2012-01-30 | Discharge: 2012-01-30 | Disposition: A | Discharge status: Home / Self Care | Payer: 59 | Source: Ambulatory Visit | Attending: Family Medicine | Admitting: Family Medicine

## 2012-01-30 DIAGNOSIS — R9389 Abnormal findings on diagnostic imaging of other specified body structures: Secondary | ICD-10-CM

## 2012-02-24 ENCOUNTER — Encounter (INDEPENDENT_AMBULATORY_CARE_PROVIDER_SITE_OTHER): Payer: 59 | Admitting: Ophthalmology

## 2012-02-24 DIAGNOSIS — H35039 Hypertensive retinopathy, unspecified eye: Secondary | ICD-10-CM

## 2012-02-24 DIAGNOSIS — I10 Essential (primary) hypertension: Secondary | ICD-10-CM

## 2012-02-24 DIAGNOSIS — H348392 Tributary (branch) retinal vein occlusion, unspecified eye, stable: Secondary | ICD-10-CM

## 2012-02-24 DIAGNOSIS — H43819 Vitreous degeneration, unspecified eye: Secondary | ICD-10-CM

## 2012-03-23 ENCOUNTER — Encounter (INDEPENDENT_AMBULATORY_CARE_PROVIDER_SITE_OTHER): Payer: 59 | Admitting: Ophthalmology

## 2012-03-23 DIAGNOSIS — H348392 Tributary (branch) retinal vein occlusion, unspecified eye, stable: Secondary | ICD-10-CM

## 2012-07-23 ENCOUNTER — Ambulatory Visit (INDEPENDENT_AMBULATORY_CARE_PROVIDER_SITE_OTHER): Payer: 59 | Admitting: Ophthalmology

## 2012-07-23 DIAGNOSIS — H348392 Tributary (branch) retinal vein occlusion, unspecified eye, stable: Secondary | ICD-10-CM

## 2012-07-23 DIAGNOSIS — H35039 Hypertensive retinopathy, unspecified eye: Secondary | ICD-10-CM

## 2012-07-23 DIAGNOSIS — I10 Essential (primary) hypertension: Secondary | ICD-10-CM

## 2012-07-23 DIAGNOSIS — H43819 Vitreous degeneration, unspecified eye: Secondary | ICD-10-CM

## 2013-02-11 ENCOUNTER — Other Ambulatory Visit: Payer: Self-pay | Admitting: Family Medicine

## 2013-02-15 ENCOUNTER — Ambulatory Visit
Admission: RE | Admit: 2013-02-15 | Discharge: 2013-02-15 | Disposition: A | Discharge status: Home / Self Care | Payer: 59 | Source: Ambulatory Visit | Attending: Family Medicine | Admitting: Family Medicine

## 2013-04-22 ENCOUNTER — Ambulatory Visit (INDEPENDENT_AMBULATORY_CARE_PROVIDER_SITE_OTHER): Payer: BC Managed Care – PPO | Admitting: Ophthalmology

## 2013-04-22 DIAGNOSIS — I10 Essential (primary) hypertension: Secondary | ICD-10-CM

## 2013-04-22 DIAGNOSIS — H43819 Vitreous degeneration, unspecified eye: Secondary | ICD-10-CM

## 2013-04-22 DIAGNOSIS — H348392 Tributary (branch) retinal vein occlusion, unspecified eye, stable: Secondary | ICD-10-CM

## 2013-04-22 DIAGNOSIS — H35039 Hypertensive retinopathy, unspecified eye: Secondary | ICD-10-CM

## 2013-12-01 ENCOUNTER — Ambulatory Visit: Payer: 59 | Admitting: Interventional Cardiology

## 2014-01-06 ENCOUNTER — Encounter: Payer: Self-pay | Admitting: General Surgery

## 2014-01-06 ENCOUNTER — Ambulatory Visit (INDEPENDENT_AMBULATORY_CARE_PROVIDER_SITE_OTHER): Payer: 59 | Admitting: Interventional Cardiology

## 2014-01-06 ENCOUNTER — Encounter: Payer: Self-pay | Admitting: Interventional Cardiology

## 2014-01-06 VITALS — BP 140/74 | HR 55 | Ht 71.0 in | Wt 222.0 lb

## 2014-01-06 DIAGNOSIS — I10 Essential (primary) hypertension: Secondary | ICD-10-CM

## 2014-01-06 DIAGNOSIS — I739 Peripheral vascular disease, unspecified: Secondary | ICD-10-CM

## 2014-01-06 DIAGNOSIS — I779 Disorder of arteries and arterioles, unspecified: Secondary | ICD-10-CM

## 2014-01-06 DIAGNOSIS — I2510 Atherosclerotic heart disease of native coronary artery without angina pectoris: Secondary | ICD-10-CM

## 2014-01-06 DIAGNOSIS — E782 Mixed hyperlipidemia: Secondary | ICD-10-CM

## 2014-01-06 NOTE — Progress Notes (Signed)
Patient ID: Erik Perez, male   DOB: Feb 18, 1946, 68 y.o.   MRN: [id number]    [address], [address], [place]  [address] Phone: [phone] Fax:  [phone]  Date:  01/06/2014   ID:  Erik Perez, DOB 08/10/1946, MRN [id number]  PCP:  Gennette Pac, MD   ASSESSMENT:  1. Coronary artery disease with prior bypass grafting 2000 with LIMA to LAD RIMA to RCA. 2009 had Taxus stent the circumflex. Currently asymptomatic  2. Hypertension, stable 3. Hyperlipidemia follow the low by Dr. Hulan Fess with blood work due in August   PLAN:  1. Aerobic exercise, diet, and weight loss 2. Call if chest discomfort or cardiovascular complaints 3. Clinical followup in one year   SUBJECTIVE: Erik Perez is a 68 y.o. male who is doing well. Does have a granddaughter who is now 72 months old. They live in [place]. He has had some difficulty with gout recently. Exercise has curtailed because of that. He has no noticeable complaints. He specifically denies angina, orthopnea, PND, edema, palpitations, and syncope.   Wt Readings from Last 3 Encounters:  01/06/14 222 lb (100.699 kg)     Past Medical History  Diagnosis Date  . Coronary artery disease   . Hypertension   . Hyperlipidemia   . OSA (obstructive sleep apnea)     Split 02-08-07 AHI total 32/hr; REM 60/hr O2 sat min NREM 88% REM 88%  . 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 allopurinol (ZYLOPRIM) 100 MG tablet Take 100 mg by mouth daily.      Marland Kitchen amLODipine (NORVASC) 5 MG tablet Take 5 mg by mouth 2 (two) times daily.      Marland Kitchen aspirin 325 MG EC tablet Take 325 mg by mouth daily.      Marland Kitchen atorvastatin (LIPITOR) 40 MG tablet Take 40 mg by mouth daily.      . calcium gluconate 500 MG tablet Take 1 tablet by mouth 3 (three) times daily.      . Cholecalciferol (VITAMIN D) 2000 UNITS tablet Take  2,000 Units by mouth daily.      . fexofenadine (ALLEGRA) 180 MG tablet Take 180 mg by mouth daily.      . hydrochlorothiazide (HYDRODIURIL) 25 MG tablet Take 25 mg by mouth daily.      Marland Kitchen lisinopril (PRINIVIL,ZESTRIL) 40 MG tablet Take 40 mg by mouth daily.      . metoprolol (LOPRESSOR) 50 MG tablet Take 50 mg by mouth. 75 MG in the am and 50 MG in the Evening      . Multiple Vitamin (MULTIVITAMIN) tablet Take 1 tablet by mouth daily.      . nitroGLYCERIN (NITROSTAT) 0.4 MG SL tablet Place 0.4 mg under the tongue every 5 (five) minutes as needed for chest pain.      . Omega-3 Fatty Acids (FISH OIL) 1200 MG CAPS Take by mouth 2 (two) times daily.      . sildenafil (VIAGRA) 100 MG tablet Take 100 mg by mouth daily as needed for erectile dysfunction.       No current facility-administered medications for this visit.    Allergies:    Allergies  Allergen Reactions  . Diclofenac Sodium     Chest Pain  . Gabapentin     Increased sedation   . Niaspan [Niacin Er]     Involuntary facial  flushing    Social History:  The patient  reports that he has quit smoking. He does not have any smokeless tobacco history on file. He reports that he drinks alcohol. He reports that he does not use illicit drugs.   ROS:  Please see the history of present illness.    All other systems reviewed and negative.   OBJECTIVE: VS:  BP 140/74  Pulse 55  Ht 5\' 11"  (1.803 m)  Wt 222 lb (100.699 kg)  BMI 30.98 kg/m2 Well nourished, well developed, in no acute distress, obese  HEENT: normal Neck: JVD flat. Carotid bruit without bruits  Cardiac:  normal S1, S2; RRR; no murmur Lungs:  clear to auscultation bilaterally, no wheezing, rhonchi or rales Abd: soft, nontender, no hepatomegaly Ext: Edema absent. Pulses 2+ bilateral  Skin: warm and dry Neuro:  CNs 2-12 intact, no focal abnormalities noted  EKG:  Normal sinus rhythm with nonspecific T wave flattening and QS pattern V1 and V2, unchanged from prior tracings.        Signed, Illene Labrador III, MD 01/06/2014 11:16 AM

## 2014-01-06 NOTE — Patient Instructions (Signed)
Your physician recommends that you continue on your current medications as directed. Please refer to the Current Medication list given to you today.  Your physician discussed the importance of regular exercise and recommended that you continue a regular exercise program for good health.  Your Physician suggest that you also keep a healthy diet  Your physician wants you to follow-up in: 1 year with Dr Gaspar Bidding will receive a reminder letter in the mail two months in advance. If you don't receive a letter, please call our office to schedule the follow-up appointment.

## 2014-01-26 ENCOUNTER — Other Ambulatory Visit: Payer: Self-pay | Admitting: Family Medicine

## 2014-01-26 ENCOUNTER — Encounter: Payer: Self-pay | Admitting: Interventional Cardiology

## 2014-01-26 DIAGNOSIS — I779 Disorder of arteries and arterioles, unspecified: Secondary | ICD-10-CM

## 2014-01-26 DIAGNOSIS — I739 Peripheral vascular disease, unspecified: Principal | ICD-10-CM

## 2014-02-14 ENCOUNTER — Encounter (INDEPENDENT_AMBULATORY_CARE_PROVIDER_SITE_OTHER): Payer: Self-pay

## 2014-02-14 ENCOUNTER — Ambulatory Visit
Admission: RE | Admit: 2014-02-14 | Discharge: 2014-02-14 | Disposition: A | Discharge status: Home / Self Care | Payer: Medicare Other | Source: Ambulatory Visit | Attending: Family Medicine | Admitting: Family Medicine

## 2014-02-14 DIAGNOSIS — I739 Peripheral vascular disease, unspecified: Principal | ICD-10-CM

## 2014-02-14 DIAGNOSIS — I779 Disorder of arteries and arterioles, unspecified: Secondary | ICD-10-CM

## 2014-03-25 DIAGNOSIS — H6120 Impacted cerumen, unspecified ear: Secondary | ICD-10-CM | POA: Diagnosis not present

## 2014-05-05 ENCOUNTER — Ambulatory Visit (INDEPENDENT_AMBULATORY_CARE_PROVIDER_SITE_OTHER): Payer: BC Managed Care – PPO | Admitting: Ophthalmology

## 2014-05-11 ENCOUNTER — Ambulatory Visit (INDEPENDENT_AMBULATORY_CARE_PROVIDER_SITE_OTHER): Payer: Medicare Other | Admitting: Ophthalmology

## 2014-05-11 DIAGNOSIS — H348392 Tributary (branch) retinal vein occlusion, unspecified eye, stable: Secondary | ICD-10-CM | POA: Diagnosis not present

## 2014-05-11 DIAGNOSIS — I10 Essential (primary) hypertension: Secondary | ICD-10-CM

## 2014-05-11 DIAGNOSIS — H43819 Vitreous degeneration, unspecified eye: Secondary | ICD-10-CM

## 2014-05-11 DIAGNOSIS — H35039 Hypertensive retinopathy, unspecified eye: Secondary | ICD-10-CM | POA: Diagnosis not present

## 2014-05-17 DIAGNOSIS — I129 Hypertensive chronic kidney disease with stage 1 through stage 4 chronic kidney disease, or unspecified chronic kidney disease: Secondary | ICD-10-CM | POA: Diagnosis not present

## 2014-05-17 DIAGNOSIS — N183 Chronic kidney disease, stage 3 unspecified: Secondary | ICD-10-CM | POA: Diagnosis not present

## 2014-05-17 DIAGNOSIS — E785 Hyperlipidemia, unspecified: Secondary | ICD-10-CM | POA: Diagnosis not present

## 2014-05-22 ENCOUNTER — Other Ambulatory Visit (INDEPENDENT_AMBULATORY_CARE_PROVIDER_SITE_OTHER): Payer: Medicare Other

## 2014-05-22 ENCOUNTER — Other Ambulatory Visit: Payer: Self-pay

## 2014-05-22 DIAGNOSIS — E782 Mixed hyperlipidemia: Secondary | ICD-10-CM | POA: Diagnosis not present

## 2014-05-22 LAB — LIPID PANEL
Cholesterol: 123 mg/dL (ref 0–200)
HDL: 28.6 mg/dL — AB (ref >39.00)
LDL Cholesterol: 67 mg/dL (ref 0–99)
NONHDL: 94.4
TRIGLYCERIDES: 135 mg/dL (ref 0.0–149.0)
Total CHOL/HDL Ratio: 4
VLDL: 27 mg/dL (ref 0.0–40.0)

## 2014-05-22 LAB — HEPATIC FUNCTION PANEL
ALBUMIN: 3.9 g/dL (ref 3.5–5.2)
ALK PHOS: 44 U/L (ref 39–117)
ALT: 51 U/L (ref 0–53)
AST: 41 U/L — ABNORMAL HIGH (ref 0–37)
Bilirubin, Direct: 0.1 mg/dL (ref 0.0–0.3)
TOTAL PROTEIN: 7.1 g/dL (ref 6.0–8.3)
Total Bilirubin: 0.9 mg/dL (ref 0.2–1.2)

## 2014-05-25 ENCOUNTER — Telehealth: Payer: Self-pay

## 2014-05-25 NOTE — Telephone Encounter (Signed)
Message copied by Lamar Laundry on Thu May 25, 2014  2:51 PM ------      Message from: Daneen Schick      Created: Tue May 23, 2014  5:31 PM       At target. Repeat in 12 months ------

## 2014-05-30 DIAGNOSIS — G4733 Obstructive sleep apnea (adult) (pediatric): Secondary | ICD-10-CM | POA: Diagnosis not present

## 2014-05-30 DIAGNOSIS — I10 Essential (primary) hypertension: Secondary | ICD-10-CM | POA: Diagnosis not present

## 2014-05-30 DIAGNOSIS — R7301 Impaired fasting glucose: Secondary | ICD-10-CM | POA: Diagnosis not present

## 2014-05-30 DIAGNOSIS — I2510 Atherosclerotic heart disease of native coronary artery without angina pectoris: Secondary | ICD-10-CM | POA: Diagnosis not present

## 2014-05-30 DIAGNOSIS — N183 Chronic kidney disease, stage 3 unspecified: Secondary | ICD-10-CM | POA: Diagnosis not present

## 2014-05-30 DIAGNOSIS — I779 Disorder of arteries and arterioles, unspecified: Secondary | ICD-10-CM | POA: Diagnosis not present

## 2014-05-31 DIAGNOSIS — G4733 Obstructive sleep apnea (adult) (pediatric): Secondary | ICD-10-CM | POA: Diagnosis not present

## 2014-06-13 DIAGNOSIS — Z23 Encounter for immunization: Secondary | ICD-10-CM | POA: Diagnosis not present

## 2014-09-08 DIAGNOSIS — J069 Acute upper respiratory infection, unspecified: Secondary | ICD-10-CM | POA: Diagnosis not present

## 2014-09-08 DIAGNOSIS — H6123 Impacted cerumen, bilateral: Secondary | ICD-10-CM | POA: Diagnosis not present

## 2014-11-03 ENCOUNTER — Encounter (INDEPENDENT_AMBULATORY_CARE_PROVIDER_SITE_OTHER): Payer: Medicare Other | Admitting: Ophthalmology

## 2014-11-03 DIAGNOSIS — H34831 Tributary (branch) retinal vein occlusion, right eye: Secondary | ICD-10-CM

## 2014-11-03 DIAGNOSIS — I10 Essential (primary) hypertension: Secondary | ICD-10-CM

## 2014-11-03 DIAGNOSIS — H35033 Hypertensive retinopathy, bilateral: Secondary | ICD-10-CM

## 2014-11-03 DIAGNOSIS — H43811 Vitreous degeneration, right eye: Secondary | ICD-10-CM

## 2015-01-12 ENCOUNTER — Encounter: Payer: Self-pay | Admitting: Interventional Cardiology

## 2015-01-12 ENCOUNTER — Ambulatory Visit (INDEPENDENT_AMBULATORY_CARE_PROVIDER_SITE_OTHER): Payer: Medicare Other | Admitting: Interventional Cardiology

## 2015-01-12 VITALS — BP 122/40 | HR 55 | Ht 71.0 in | Wt 215.4 lb

## 2015-01-12 DIAGNOSIS — I10 Essential (primary) hypertension: Secondary | ICD-10-CM | POA: Diagnosis not present

## 2015-01-12 DIAGNOSIS — I25118 Atherosclerotic heart disease of native coronary artery with other forms of angina pectoris: Secondary | ICD-10-CM | POA: Diagnosis not present

## 2015-01-12 DIAGNOSIS — I779 Disorder of arteries and arterioles, unspecified: Secondary | ICD-10-CM | POA: Diagnosis not present

## 2015-01-12 DIAGNOSIS — E782 Mixed hyperlipidemia: Secondary | ICD-10-CM

## 2015-01-12 DIAGNOSIS — I739 Peripheral vascular disease, unspecified: Secondary | ICD-10-CM

## 2015-01-12 NOTE — Progress Notes (Signed)
Cardiology Office Note   Date:  01/12/2015   ID:  Erik Perez, DOB November 08, 1945, MRN [id number]  PCP:  Gennette Pac, MD  Cardiologist:   Sinclair Grooms, MD   Chief Complaint  Patient presents with  . Coronary Artery Disease      History of Present Illness: Erik Perez is a 69 y.o. male who presents for follow-up of coronary artery disease with prior bypass grafting (LIMA LAD; RIMA RCA, 2009) and Taxus stent in the native circumflex, hypertension, hyperlipidemia, and bilateral carotid disease.  The patient is noticing no limitations with physical activity. He has not had angina. There've been no episodes of discomfort that have required nitroglycerin. There is no exertional dyspnea or intolerance. He's been exercising regularly without change and endurance. No medication side effects.  Past Medical History  Diagnosis Date  . Coronary artery disease   . Hypertension   . Hyperlipidemia   . OSA (obstructive sleep apnea)     Split 02-08-07 AHI total 32/hr; REM 60/hr O2 sat min NREM 88% REM 88%  . Carotid artery obstruction     Bilateral moderate carotid obstruction 2013 doppler    Past Surgical History  Procedure Laterality Date  . Coronary artery bypass graft      [place], to LAD, RIMA to RCA 2000,Cfx. Taxus stent 2002  . Cervical disc surgery  2009     Current Outpatient Prescriptions  Medication Sig Dispense Refill  . allopurinol (ZYLOPRIM) 100 MG tablet Take 100 mg by mouth 2 (two) times daily.     Marland Kitchen amLODipine (NORVASC) 5 MG tablet Take 5 mg by mouth 2 (two) times daily.    Marland Kitchen aspirin 325 MG EC tablet Take 325 mg by mouth daily.    Marland Kitchen atorvastatin (LIPITOR) 40 MG tablet Take 40 mg by mouth daily.    . calcium gluconate 500 MG tablet Take 1 tablet by mouth 3 (three) times daily.    . Cholecalciferol (VITAMIN D) 2000 UNITS tablet Take 2,000 Units by mouth 2 (two) times daily.     . fexofenadine (ALLEGRA) 180 MG tablet Take 180 mg by mouth daily.    .  hydrochlorothiazide (HYDRODIURIL) 25 MG tablet Take 25 mg by mouth daily.    Marland Kitchen lisinopril (PRINIVIL,ZESTRIL) 40 MG tablet Take 40 mg by mouth daily.    . metoprolol (LOPRESSOR) 50 MG tablet Take 50 mg by mouth. 75 MG in the am and 50 MG in the Evening    . Multiple Vitamin (MULTIVITAMIN) tablet Take 1 tablet by mouth daily.    . nitroGLYCERIN (NITROSTAT) 0.4 MG SL tablet Place 0.4 mg under the tongue every 5 (five) minutes as needed for chest pain.    . Omega-3 Fatty Acids (FISH OIL) 1200 MG CAPS Take by mouth 2 (two) times daily.    . sildenafil (VIAGRA) 100 MG tablet Take 100 mg by mouth daily as needed for erectile dysfunction.     No current facility-administered medications for this visit.    Allergies:   Diclofenac sodium; Gabapentin; and Niaspan    Social History:  The patient  reports that he has quit smoking. He does not have any smokeless tobacco history on file. He reports that he drinks alcohol. He reports that he does not use illicit drugs.   Family History:  The patient's family history includes Heart disease in his brother, father, and mother.    ROS:  Please see the history of present illness.   Otherwise, review of systems are  positive formuscle aches but otherwise unremarkable .   All other systems are reviewed and negative.    PHYSICAL EXAM: VS:  BP 122/40 mmHg  Pulse 55  Ht 5\' 11"  (1.803 m)  Wt 215 lb 6.4 oz (97.705 kg)  BMI 30.06 kg/m2 , BMI Body mass index is 30.06 kg/(m^2). GEN: Well nourished, well developed, in no acute distress HEENT: normal Neck: no JVD, carotid bruits, or masses Cardiac: RRR; no murmurs, rubs, or gallops,no edema  Respiratory:  clear to auscultation bilaterally, normal work of breathing GI: soft, nontender, nondistended, + BS MS: no deformity or atrophy Skin: warm and dry, no rash Neuro:  Strength and sensation are intact Psych: euthymic mood, full affect   EKG:  EKG is ordered today. The ekg ordered today demonstrates normal sinus  rhythm with QS pattern V1 and V2. No change compared to prior   Recent Labs: 05/22/2014: ALT 51    Lipid Panel    Component Value Date/Time   CHOL 123 05/22/2014 1021   TRIG 135.0 05/22/2014 1021   HDL 28.60* 05/22/2014 1021   CHOLHDL 4 05/22/2014 1021   VLDL 27.0 05/22/2014 1021   LDLCALC 67 05/22/2014 1021      Wt Readings from Last 3 Encounters:  01/12/15 215 lb 6.4 oz (97.705 kg)  01/06/14 222 lb (100.699 kg)      Other studies Reviewed: Additional studies/ records that were reviewed today includereviewed prior angiogram.  Review of the above records demonstrates: Prior bypass surgery and circumflex stent.   ASSESSMENT AND PLAN:  Atherosclerosis of native coronary artery with other form of angina pectoris: Asymptomatic  Essential hypertension, benign: Stable  Mixed hyperlipidemia: Followed by primary care  Carotid disease, bilateral: No bruit heard on today's exam     Current medicines are reviewed at length with the patient today.  The patient has no concerns regarding medicines.  The following changes have been made:  no change  Labs/ tests ordered today include:  Orders Placed This Encounter  Procedures  . EKG 12-Lead     Disposition:   FU with Linard Millers in 1 year   Signed, Sinclair Grooms, MD  01/12/2015 12:52 PM    [facility] [place], [place], [place]  [address] Phone: [phone]; Fax: [phone]

## 2015-01-12 NOTE — Patient Instructions (Signed)
Medication Instructions:  Your physician recommends that you continue on your current medications as directed. Please refer to the Current Medication list given to you today.  Labwork: No new orders.  Testing/Procedures: No new orders.   Follow-Up: Your physician wants you to follow-up in: 1 YEAR with Dr Tamala Julian.  You will receive a reminder letter in the mail two months in advance. If you don't receive a letter, please call our office to schedule the follow-up appointment.  Any Other Special Instructions Will Be Listed Below (If Applicable).  Please maintain an active lifestyle.

## 2015-02-23 ENCOUNTER — Other Ambulatory Visit: Payer: Self-pay | Admitting: Family Medicine

## 2015-02-23 ENCOUNTER — Encounter: Payer: Self-pay | Admitting: Interventional Cardiology

## 2015-02-23 DIAGNOSIS — I6529 Occlusion and stenosis of unspecified carotid artery: Secondary | ICD-10-CM

## 2015-03-02 ENCOUNTER — Ambulatory Visit
Admission: RE | Admit: 2015-03-02 | Discharge: 2015-03-02 | Disposition: A | Discharge status: Home / Self Care | Payer: Medicare Other | Source: Ambulatory Visit | Attending: Family Medicine | Admitting: Family Medicine

## 2015-03-02 DIAGNOSIS — I6529 Occlusion and stenosis of unspecified carotid artery: Secondary | ICD-10-CM

## 2015-05-28 ENCOUNTER — Ambulatory Visit (INDEPENDENT_AMBULATORY_CARE_PROVIDER_SITE_OTHER): Payer: Medicare Other | Admitting: Ophthalmology

## 2015-05-28 DIAGNOSIS — H43813 Vitreous degeneration, bilateral: Secondary | ICD-10-CM | POA: Diagnosis not present

## 2015-05-28 DIAGNOSIS — I10 Essential (primary) hypertension: Secondary | ICD-10-CM

## 2015-05-28 DIAGNOSIS — H35033 Hypertensive retinopathy, bilateral: Secondary | ICD-10-CM | POA: Diagnosis not present

## 2015-05-28 DIAGNOSIS — H34831 Tributary (branch) retinal vein occlusion, right eye: Secondary | ICD-10-CM | POA: Diagnosis not present

## 2016-05-28 ENCOUNTER — Ambulatory Visit (INDEPENDENT_AMBULATORY_CARE_PROVIDER_SITE_OTHER): Payer: Medicare Other | Admitting: Ophthalmology

## 2016-06-25 ENCOUNTER — Ambulatory Visit (INDEPENDENT_AMBULATORY_CARE_PROVIDER_SITE_OTHER): Payer: Medicare Other | Admitting: Ophthalmology

## 2016-06-25 DIAGNOSIS — I10 Essential (primary) hypertension: Secondary | ICD-10-CM

## 2016-06-25 DIAGNOSIS — H43813 Vitreous degeneration, bilateral: Secondary | ICD-10-CM

## 2016-06-25 DIAGNOSIS — H35033 Hypertensive retinopathy, bilateral: Secondary | ICD-10-CM

## 2016-06-25 DIAGNOSIS — H348312 Tributary (branch) retinal vein occlusion, right eye, stable: Secondary | ICD-10-CM

## 2016-06-25 DIAGNOSIS — H353121 Nonexudative age-related macular degeneration, left eye, early dry stage: Secondary | ICD-10-CM

## 2016-07-03 ENCOUNTER — Encounter: Payer: Self-pay | Admitting: Interventional Cardiology

## 2016-10-24 ENCOUNTER — Ambulatory Visit (INDEPENDENT_AMBULATORY_CARE_PROVIDER_SITE_OTHER): Payer: Medicare Other | Admitting: Interventional Cardiology

## 2016-10-24 ENCOUNTER — Encounter (INDEPENDENT_AMBULATORY_CARE_PROVIDER_SITE_OTHER): Payer: Self-pay

## 2016-10-24 ENCOUNTER — Encounter: Payer: Self-pay | Admitting: Interventional Cardiology

## 2016-10-24 VITALS — BP 148/80 | HR 59 | Ht 71.0 in | Wt 221.8 lb

## 2016-10-24 DIAGNOSIS — I2510 Atherosclerotic heart disease of native coronary artery without angina pectoris: Secondary | ICD-10-CM

## 2016-10-24 DIAGNOSIS — I779 Disorder of arteries and arterioles, unspecified: Secondary | ICD-10-CM

## 2016-10-24 DIAGNOSIS — I10 Essential (primary) hypertension: Secondary | ICD-10-CM

## 2016-10-24 DIAGNOSIS — E782 Mixed hyperlipidemia: Secondary | ICD-10-CM

## 2016-10-24 DIAGNOSIS — I739 Peripheral vascular disease, unspecified: Secondary | ICD-10-CM

## 2016-10-24 NOTE — Patient Instructions (Signed)

## 2016-10-24 NOTE — Progress Notes (Signed)
Cardiology Office Note    Date:  10/24/2016   ID:  Erik Perez, DOB 09-23-1946, MRN [id number]  PCP:  Gennette Pac, MD  Cardiologist: Sinclair Grooms, MD   Chief Complaint  Patient presents with  . Coronary Artery Disease    History of Present Illness:  Erik Perez is a 71 y.o. male who presents for follow-up of coronary artery disease with prior bypass grafting (LIMA LAD; RIMA RCA, 2009) and Taxus stent in the native circumflex, hypertension, hyperlipidemia, and bilateral carotid disease.  He is doing well. He denies cardiac complaints. He has not had angina. No lower extremity swelling, orthopnea, PND, palpitations, or neurological complaints.   Past Medical History:  Diagnosis Date  . Carotid artery obstruction    Bilateral moderate carotid obstruction 2013 doppler  . Coronary artery disease   . Hyperlipidemia   . Hypertension   . OSA (obstructive sleep apnea)    Split 02-08-07 AHI total 32/hr; REM 60/hr O2 sat min NREM 88% REM 88%    Past Surgical History:  Procedure Laterality Date  . [place] SURGERY  2009  . CORONARY ARTERY BYPASS GRAFT     [place], to LAD, RIMA to RCA 2000,Cfx. Taxus stent 2002    Current Medications: Outpatient Medications Prior to Visit  Medication Sig Dispense Refill  . allopurinol (ZYLOPRIM) 100 MG tablet Take 100 mg by mouth 2 (two) times daily.     Marland Kitchen amLODipine (NORVASC) 5 MG tablet Take 5 mg by mouth 2 (two) times daily.    Marland Kitchen aspirin 325 MG EC tablet Take 325 mg by mouth daily.    Marland Kitchen atorvastatin (LIPITOR) 40 MG tablet Take 40 mg by mouth daily.    . calcium gluconate 500 MG tablet Take 1 tablet by mouth 3 (three) times daily.    . Cholecalciferol (VITAMIN D) 2000 UNITS tablet Take 2,000 Units by mouth 2 (two) times daily.     . fexofenadine (ALLEGRA) 180 MG tablet Take 180 mg by mouth daily.    . hydrochlorothiazide (HYDRODIURIL) 25 MG tablet Take 25 mg by mouth daily.    Marland Kitchen lisinopril (PRINIVIL,ZESTRIL) 40 MG  tablet Take 40 mg by mouth daily.    . Multiple Vitamin (MULTIVITAMIN) tablet Take 1 tablet by mouth daily.    . nitroGLYCERIN (NITROSTAT) 0.4 MG SL tablet Place 0.4 mg under the tongue every 5 (five) minutes as needed for chest pain.    . Omega-3 Fatty Acids (FISH OIL) 1200 MG CAPS Take by mouth 2 (two) times daily.    . sildenafil (VIAGRA) 100 MG tablet Take 100 mg by mouth daily as needed for erectile dysfunction.    . metoprolol (LOPRESSOR) 50 MG tablet Take 50 mg by mouth. 75 MG in the am and 50 MG in the Evening     No facility-administered medications prior to visit.      Allergies:   Diclofenac sodium; Gabapentin; and Niaspan [niacin er]   Social History   Social History  . Marital status: Married    Spouse name: N/A  . Number of children: N/A  . Years of education: N/A   Social History Main Topics  . Smoking status: Former Research scientist (life sciences)  . Smokeless tobacco: Never Used  . Alcohol use Yes     Comment: social  . Drug use: No  . Sexual activity: Not on file   Other Topics Concern  . Not on file   Social History Narrative  . No narrative on file  Family History:  The patient's family history includes Heart disease in his brother, father, and mother.   ROS:   Please see the history of present illness.    Musculoskeletal discomfort involving the knees and lower extremities.  All other systems reviewed and are negative.   PHYSICAL EXAM:   VS:  BP (!) 148/80 (BP Location: Left Arm)   Pulse (!) 59   Ht 5\' 11"  (1.803 m)   Wt 221 lb 12.8 oz (100.6 kg)   BMI 30.93 kg/m    GEN: Well nourished, well developed, in no acute distress  HEENT: normal  Neck: no JVD, carotid bruits, or masses Cardiac: RRR; no murmurs, rubs, or gallops,no edema  Respiratory:  clear to auscultation bilaterally, normal work of breathing GI: soft, nontender, nondistended, + BS MS: no deformity or atrophy  Skin: warm and dry, no rash Neuro:  Alert and Oriented x 3, Strength and sensation are  intact Psych: euthymic mood, full affect  Wt Readings from Last 3 Encounters:  10/24/16 221 lb 12.8 oz (100.6 kg)  01/12/15 215 lb 6.4 oz (97.7 kg)  01/06/14 222 lb (100.7 kg)      Studies/Labs Reviewed:   EKG:  EKG  Normal sinus rhythm/sinus bradycardia with QRS pattern in V1 and V2. Flattened T waves noted. Borderline short PR interval is noted. Compared to prior tracing from 2016, no significant abnormality is noted.  Recent Labs: No results found for requested labs within last 8760 hours.   Lipid Panel    Component Value Date/Time   CHOL 123 05/22/2014 1021   TRIG 135.0 05/22/2014 1021   HDL 28.60 (L) 05/22/2014 1021   CHOLHDL 4 05/22/2014 1021   VLDL 27.0 05/22/2014 1021   LDLCALC 67 05/22/2014 1021    Additional studies/ records that were reviewed today include:  No significant cardiac evaluation over the last 2 years. None is required.    ASSESSMENT:    1. Atherosclerosis of native coronary artery of native heart without angina pectoris   2. Essential hypertension, benign   3. Carotid disease, bilateral ([place])   4. Mixed hyperlipidemia      PLAN:  In order of problems listed above:  1. Decrease aspirin to 81 mg per day. Continue regular aerobic activity. 2. 2 g sodium diet.Target blood pressure less than 140/90 mmHg 3. No bruits are heard. 4. Target LDL less than 70. This is followed by primary care.    Medication Adjustments/Labs and Tests Ordered: Current medicines are reviewed at length with the patient today.  Concerns regarding medicines are outlined above.  Medication changes, Labs and Tests ordered today are listed in the Patient Instructions below. Patient Instructions  Medication Instructions:  None  Labwork: None  Testing/Procedures: None  Follow-Up: Your physician wants you to follow-up in: 1 year with Dr. Tamala Julian.  You will receive a reminder letter in the mail two months in advance. If you don't receive a letter, please call our office  to schedule the follow-up appointment.   Any Other Special Instructions Will Be Listed Below (If Applicable).     If you need a refill on your cardiac medications before your next appointment, please call your pharmacy.      Signed, Sinclair Grooms, MD  10/24/2016 10:34 AM    [facility] [place], [place], [place]  [address] Phone: [phone]; Fax: [phone]

## 2016-12-11 DIAGNOSIS — C61 Malignant neoplasm of prostate: Secondary | ICD-10-CM | POA: Insufficient documentation

## 2017-07-01 ENCOUNTER — Ambulatory Visit (INDEPENDENT_AMBULATORY_CARE_PROVIDER_SITE_OTHER): Payer: Medicare Other | Admitting: Ophthalmology

## 2017-07-01 DIAGNOSIS — H35033 Hypertensive retinopathy, bilateral: Secondary | ICD-10-CM

## 2017-07-01 DIAGNOSIS — H43813 Vitreous degeneration, bilateral: Secondary | ICD-10-CM | POA: Diagnosis not present

## 2017-07-01 DIAGNOSIS — I10 Essential (primary) hypertension: Secondary | ICD-10-CM | POA: Diagnosis not present

## 2017-07-01 DIAGNOSIS — H348312 Tributary (branch) retinal vein occlusion, right eye, stable: Secondary | ICD-10-CM | POA: Diagnosis not present

## 2017-07-10 ENCOUNTER — Other Ambulatory Visit: Payer: Self-pay | Admitting: Family Medicine

## 2017-07-10 DIAGNOSIS — I6619 Occlusion and stenosis of unspecified anterior cerebral artery: Secondary | ICD-10-CM

## 2017-07-13 ENCOUNTER — Ambulatory Visit
Admission: RE | Admit: 2017-07-13 | Discharge: 2017-07-13 | Disposition: A | Discharge status: Home / Self Care | Payer: Medicare Other | Source: Ambulatory Visit | Attending: Family Medicine | Admitting: Family Medicine

## 2017-07-13 DIAGNOSIS — I6619 Occlusion and stenosis of unspecified anterior cerebral artery: Secondary | ICD-10-CM

## 2017-11-16 ENCOUNTER — Encounter: Payer: Self-pay | Admitting: Interventional Cardiology

## 2017-11-23 ENCOUNTER — Ambulatory Visit: Payer: Medicare Other | Admitting: Interventional Cardiology

## 2017-12-09 ENCOUNTER — Ambulatory Visit: Payer: Medicare Other | Admitting: Nurse Practitioner

## 2017-12-28 ENCOUNTER — Encounter: Payer: Self-pay | Admitting: Nurse Practitioner

## 2017-12-28 ENCOUNTER — Ambulatory Visit: Payer: Medicare Other | Admitting: Nurse Practitioner

## 2017-12-28 VITALS — BP 130/70 | HR 58 | Ht 72.0 in | Wt 209.4 lb

## 2017-12-28 DIAGNOSIS — I2510 Atherosclerotic heart disease of native coronary artery without angina pectoris: Secondary | ICD-10-CM

## 2017-12-28 DIAGNOSIS — E782 Mixed hyperlipidemia: Secondary | ICD-10-CM | POA: Diagnosis not present

## 2017-12-28 DIAGNOSIS — I10 Essential (primary) hypertension: Secondary | ICD-10-CM

## 2017-12-28 NOTE — Patient Instructions (Addendum)
We will be checking the following labs today - NONE   Medication Instructions:    Continue with your current medicines.     Testing/Procedures To Be Arranged:  N/A  Follow-Up:   See Dr. Tamala Julian or me in one year    Other Special Instructions:   N/A    If you need a refill on your cardiac medications before your next appointment, please call your pharmacy.   Call the [place] office at [phone] if you have any questions, problems or concerns.

## 2017-12-28 NOTE — Progress Notes (Signed)
CARDIOLOGY OFFICE NOTE  Date:  12/28/2017    Erik Perez Date of Birth: 09-12-1946 Medical Record #[id number]  PCP:  Hulan Fess, MD  Cardiologist:  Jennings Books    Chief Complaint  Patient presents with  . Coronary Artery Disease  . Hyperlipidemia  . Hypertension    15 month check - seen for Dr. Tamala Julian    History of Present Illness: Erik Perez is a 72 y.o. male who presents today for a 15 month check. Seen for Dr. Tamala Julian.  He has a history of known CAD with prior bypass grafting (LIMA LAD; RIMA RCA, 2009) and Taxus stent in the native circumflex in 2002. Other issues include CKD - followed by Dr. Posey Pronto - OSA, hypertension, hyperlipidemia, and bilateral carotid disease. Last carotid duplex was in October of 2018 and showed 50 to 69% stenosis in the right and left internal carotid arteries.   Last seen in January of 2018. Was doing well.   Comes in today. Here alone. He has had a recent bout of the flu - pretty prolonged course - now resolved. Has just gotten back to his water aerobics class. Has lost weight. No chest pain. Breathing is good. BP is good. Seeing Dr. Posey Pronto with nephrology. Labs typically checked between Dr. Posey Pronto and Dr. Rex Kras. Has been found to have prostate cancer - prognosis felt to be good. Overall, he feels like he is doing well and has no concerns. He is using his CPAP - was not able to wear when he had the flu. Planning on a 10 day trip to visit kids/grand kids.   Past Medical History:  Diagnosis Date  . Carotid artery obstruction    Bilateral moderate carotid obstruction 2013 doppler  . Coronary artery disease   . Hyperlipidemia   . Hypertension   . OSA (obstructive sleep apnea)    Split 02-08-07 AHI total 32/hr; REM 60/hr O2 sat min NREM 88% REM 88%    Past Surgical History:  Procedure Laterality Date  . [place] SURGERY  2009  . CORONARY ARTERY BYPASS GRAFT     [place], to LAD, RIMA to RCA 2000,Cfx. Taxus stent 2002      Medications: Current Meds  Medication Sig  . allopurinol (ZYLOPRIM) 100 MG tablet Take 100 mg by mouth 2 (two) times daily.   Marland Kitchen amLODipine (NORVASC) 5 MG tablet Take 5 mg by mouth 2 (two) times daily.  Marland Kitchen aspirin EC 81 MG tablet Take 81 mg by mouth daily.  Marland Kitchen atorvastatin (LIPITOR) 40 MG tablet Take 40 mg by mouth daily.  . calcium gluconate 500 MG tablet Take 1 tablet by mouth 3 (three) times daily.  . Cholecalciferol (VITAMIN D) 2000 UNITS tablet Take 2,000 Units by mouth 2 (two) times daily.   . Cyanocobalamin (B-12 IJ) Take one (1) injection into the muscle once a month.  . fexofenadine (ALLEGRA) 180 MG tablet Take 180 mg by mouth daily.  . hydrochlorothiazide (HYDRODIURIL) 25 MG tablet Take 25 mg by mouth daily.  Marland Kitchen lisinopril (PRINIVIL,ZESTRIL) 40 MG tablet Take 40 mg by mouth daily.  . metoprolol (LOPRESSOR) 50 MG tablet Take 1.5 tablets (75 mg total) by mouth each morning. Take 1 tablet (50 mg total) by mouth each evening.  . Multiple Vitamin (MULTIVITAMIN) tablet Take 1 tablet by mouth daily.  . nitroGLYCERIN (NITROSTAT) 0.4 MG SL tablet Place 0.4 mg under the tongue every 5 (five) minutes as needed for chest pain.  . Omega-3 Fatty Acids (FISH  OIL) 1200 MG CAPS Take by mouth 2 (two) times daily.  . sildenafil (VIAGRA) 100 MG tablet Take 100 mg by mouth daily as needed for erectile dysfunction.     Allergies: Allergies  Allergen Reactions  . Diclofenac Sodium     Chest Pain  . Gabapentin     Increased sedation   . Niaspan [Niacin Er]     Involuntary facial flushing    Social History: The patient  reports that he has quit smoking. He has never used smokeless tobacco. He reports that he drinks alcohol. He reports that he does not use drugs.   Family History: The patient's family history includes Heart disease in his brother, father, and mother.   Review of Systems: Please see the history of present illness.   Otherwise, the review of systems is positive for none.    All other systems are reviewed and negative.   Physical Exam: VS:  BP 130/70 (BP Location: Left Arm, Patient Position: Sitting, Cuff Size: Normal)   Pulse (!) 58   Ht 6' (1.829 m)   Wt 209 lb 6.4 oz (95 kg)   BMI 28.40 kg/m  .  BMI Body mass index is 28.4 kg/m.  Wt Readings from Last 3 Encounters:  12/28/17 209 lb 6.4 oz (95 kg)  10/24/16 221 lb 12.8 oz (100.6 kg)  01/12/15 215 lb 6.4 oz (97.7 kg)    General: Pleasant. Alert and in no acute distress.  He has lost 12 pounds since last visit here.  HEENT: Normal.  Neck: Supple, no JVD, carotid bruits, or masses noted.  Cardiac: Regular rate and rhythm. No murmurs, rubs, or gallops. No edema.  Respiratory:  Lungs are clear to auscultation bilaterally with normal work of breathing.  GI: Soft and nontender.  MS: No deformity or atrophy. Gait and ROM intact.  Skin: Warm and dry. Color is normal.  Neuro:  Strength and sensation are intact and no gross focal deficits noted.  Psych: Alert, appropriate and with normal affect.   LABORATORY DATA:  EKG:  EKG is ordered today. 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   Lab Results  Component Value Date   WBC 9.3 06/07/2008   HGB 11.4 (L) 06/07/2008   HCT 33.1 (L) 06/07/2008   PLT 138 (L) 06/07/2008   GLUCOSE 140 (H) 06/07/2008   CHOL 123 05/22/2014   TRIG 135.0 05/22/2014   HDL 28.60 (L) 05/22/2014   LDLCALC 67 05/22/2014   ALT 51 05/22/2014   AST 41 (H) 05/22/2014   NA 140 06/07/2008   K 4.8 06/07/2008   CL 109 06/07/2008   CREATININE 1.08 06/07/2008   BUN 12 06/07/2008   CO2 25 06/07/2008   INR 1.0 06/02/2008       BNP (last 3 results) No results for input(s): BNP in the last 8760 hours.  ProBNP (last 3 results) No results for input(s): PROBNP in the last 8760 hours.   Other Studies Reviewed Today:  Carotid Doppler IMPRESSION October 2018: 50-69% stenosis in the right and left internal carotid arteries   Electronically Signed   By:  Marybelle Killings M.D.   On: 07/13/2017 16:47  Assessment/Plan:  1. CAD - remote CABG/PCI - doing well clinically. Continue with CV risk factor modification. Labs are checked by his PCP.    2. HTN - BP is good on his current regimen  3. HLD - on statin - labs from PCP noted - his LDL is 36  4. Carotid disease -  needs repeat study October of 2019. He is aware - he notes that Dr. Rex Kras orders this for him.   5. OSA - using CPAP  6. CKD - followed by [facility]  7. Recent diagnosis of prostate cancer  8. Recent flu - resolved.   Current medicines are reviewed with the patient today.  The patient does not have concerns regarding medicines other than what has been noted above.  The following changes have been made:  See above.  Labs/ tests ordered today include:    Orders Placed This Encounter  Procedures  . EKG 12-Lead     Disposition:   FU with me or Dr. Tamala Julian in one year.   Patient is agreeable to this plan and will call if any problems develop in the interim.   SignedTruitt Merle, NP  12/28/2017 2:02 PM  [facility] [address] [place], [place]  [address] Phone: [phone] Fax: [phone]

## 2018-02-26 ENCOUNTER — Other Ambulatory Visit: Payer: Self-pay | Admitting: Surgery

## 2018-07-01 ENCOUNTER — Encounter (INDEPENDENT_AMBULATORY_CARE_PROVIDER_SITE_OTHER): Payer: Medicare Other | Admitting: Ophthalmology

## 2018-07-01 DIAGNOSIS — H348312 Tributary (branch) retinal vein occlusion, right eye, stable: Secondary | ICD-10-CM | POA: Diagnosis not present

## 2018-07-01 DIAGNOSIS — I10 Essential (primary) hypertension: Secondary | ICD-10-CM

## 2018-07-01 DIAGNOSIS — H43813 Vitreous degeneration, bilateral: Secondary | ICD-10-CM

## 2018-07-01 DIAGNOSIS — H35033 Hypertensive retinopathy, bilateral: Secondary | ICD-10-CM

## 2018-07-19 ENCOUNTER — Other Ambulatory Visit: Payer: Self-pay | Admitting: Family Medicine

## 2018-07-19 DIAGNOSIS — I6529 Occlusion and stenosis of unspecified carotid artery: Secondary | ICD-10-CM

## 2018-07-22 ENCOUNTER — Ambulatory Visit
Admission: RE | Admit: 2018-07-22 | Discharge: 2018-07-22 | Disposition: A | Discharge status: Home / Self Care | Payer: Medicare Other | Source: Ambulatory Visit | Attending: Family Medicine | Admitting: Family Medicine

## 2018-07-22 DIAGNOSIS — I6529 Occlusion and stenosis of unspecified carotid artery: Secondary | ICD-10-CM

## 2018-07-26 ENCOUNTER — Other Ambulatory Visit: Payer: Self-pay | Admitting: Family Medicine

## 2018-07-26 DIAGNOSIS — Z87891 Personal history of nicotine dependence: Secondary | ICD-10-CM

## 2018-07-28 ENCOUNTER — Ambulatory Visit
Admission: RE | Admit: 2018-07-28 | Discharge: 2018-07-28 | Disposition: A | Discharge status: Home / Self Care | Payer: Medicare Other | Source: Ambulatory Visit | Attending: Family Medicine | Admitting: Family Medicine

## 2018-07-28 DIAGNOSIS — Z87891 Personal history of nicotine dependence: Secondary | ICD-10-CM

## 2018-10-28 ENCOUNTER — Other Ambulatory Visit: Payer: Self-pay | Admitting: Urology

## 2018-10-28 DIAGNOSIS — C61 Malignant neoplasm of prostate: Secondary | ICD-10-CM

## 2018-12-16 ENCOUNTER — Other Ambulatory Visit: Payer: Self-pay

## 2018-12-16 ENCOUNTER — Ambulatory Visit
Admission: RE | Admit: 2018-12-16 | Discharge: 2018-12-16 | Disposition: A | Discharge status: Home / Self Care | Payer: Medicare Other | Source: Ambulatory Visit | Attending: Urology | Admitting: Urology

## 2018-12-16 DIAGNOSIS — C61 Malignant neoplasm of prostate: Secondary | ICD-10-CM

## 2018-12-16 MED ORDER — GADOBENATE DIMEGLUMINE 529 MG/ML IV SOLN
10.0000 mL | Freq: Once | INTRAVENOUS | Status: AC | PRN
  Administered 2018-12-16: 10 mL via INTRAVENOUS

## 2018-12-23 ENCOUNTER — Telehealth: Payer: Self-pay | Admitting: Interventional Cardiology

## 2018-12-23 NOTE — Telephone Encounter (Signed)
Spoke with pt in regards to appt scheduled with Dr. Tamala Julian on 12/31/2018.  Pt denies any cardiac issues.  Rescheduled pt for 04/08/2019.  Advised to call sooner if any cardiac issues.

## 2018-12-31 ENCOUNTER — Ambulatory Visit: Payer: Medicare Other | Admitting: Interventional Cardiology

## 2019-03-08 ENCOUNTER — Telehealth: Payer: Self-pay

## 2019-03-08 NOTE — Telephone Encounter (Signed)
YOUR CARDIOLOGY TEAM HAS ARRANGED FOR AN E-VISIT FOR YOUR APPOINTMENT - PLEASE REVIEW IMPORTANT INFORMATION BELOW SEVERAL DAYS PRIOR TO YOUR APPOINTMENT  Due to the recent COVID-19 pandemic, we are transitioning in-person office visits to tele-medicine visits in an effort to decrease unnecessary exposure to our patients, their families, and staff. These visits are billed to your insurance just like a normal visit is. We also encourage you to sign up for MyChart if you have not already done so. You will need a smartphone if possible. For patients that do not have this, we can still complete the visit using a regular telephone but do prefer a smartphone to enable video when possible. You may have a family member that lives with you that can help. If possible, we also ask that you have a blood pressure cuff and scale at home to measure your blood pressure, heart rate and weight prior to your scheduled appointment. Patients with clinical needs that need an in-person evaluation and testing will still be able to come to the office if absolutely necessary. If you have any questions, feel free to call our office.     YOUR PROVIDER WILL BE USING THE FOLLOWING PLATFORM TO COMPLETE YOUR VISIT: Doximity  . IF USING MYCHART - How to Download the MyChart App to Your SmartPhone   - If Apple, go to App Store and type in MyChart in the search bar and download the app. If Android, ask patient to go to Google Play Store and type in MyChart in the search bar and download the app. The app is free but as with any other app downloads, your phone may require you to verify saved payment information or Apple/Android password.  - You will need to then log into the app with your MyChart username and password, and select [facility] as your healthcare provider to link the account.  - When it is time for your visit, go to the MyChart app, find appointments, and click Begin Video Visit. Be sure to Select Allow for your device to  access the Microphone and Camera for your visit. You will then be connected, and your provider will be with you shortly.  **If you have any issues connecting or need assistance, please contact MyChart service desk [phone] ([phone])**  **If using a computer, in order to ensure the best quality for your visit, you will need to use either of the following Internet Browsers: Google Chrome or Microsoft Edge**  . IF USING DOXIMITY or [email or web address].ME - The staff will give you instructions on receiving your link to join the meeting the day of your visit.      2-3 DAYS BEFORE YOUR APPOINTMENT  You will receive a telephone call from one of our [facility] team members - your caller ID may say "Unknown caller." If this is a video visit, we will walk you through how to get the video launched on your phone. We will remind you check your blood pressure, heart rate and weight prior to your scheduled appointment. If you have an Apple Watch or Kardia, please upload any pertinent ECG strips the day before or morning of your appointment to MyChart. Our staff will also make sure you have reviewed the consent and agree to move forward with your scheduled tele-health visit.     THE DAY OF YOUR APPOINTMENT  Approximately 15 minutes prior to your scheduled appointment, you will receive a telephone call from one of HeartCare team - your caller ID may say "Unknown caller."    Our staff will confirm medications, vital signs for the day and any symptoms you may be experiencing. Please have this information available prior to the time of visit start. It may also be helpful for you to have a pad of paper and pen handy for any instructions given during your visit. They will also walk you through joining the smartphone meeting if this is a video visit.    CONSENT FOR TELE-HEALTH VISIT - PLEASE REVIEW  I hereby voluntarily request, consent and authorize [facility] and its employed or contracted physicians, physician  assistants, nurse practitioners or other licensed health care professionals (the Practitioner), to provide me with telemedicine health care services (the "Services") as deemed necessary by the treating Practitioner. I acknowledge and consent to receive the Services by the Practitioner via telemedicine. I understand that the telemedicine visit will involve communicating with the Practitioner through live audiovisual communication technology and the disclosure of certain medical information by electronic transmission. I acknowledge that I have been given the opportunity to request an in-person assessment or other available alternative prior to the telemedicine visit and am voluntarily participating in the telemedicine visit.  I understand that I have the right to withhold or withdraw my consent to the use of telemedicine in the course of my care at any time, without affecting my right to future care or treatment, and that the Practitioner or I may terminate the telemedicine visit at any time. I understand that I have the right to inspect all information obtained and/or recorded in the course of the telemedicine visit and may receive copies of available information for a reasonable fee.  I understand that some of the potential risks of receiving the Services via telemedicine include:  . Delay or interruption in medical evaluation due to technological equipment failure or disruption; . Information transmitted may not be sufficient (e.g. poor resolution of images) to allow for appropriate medical decision making by the Practitioner; and/or  . In rare instances, security protocols could fail, causing a breach of personal health information.  Furthermore, I acknowledge that it is my responsibility to provide information about my medical history, conditions and care that is complete and accurate to the best of my ability. I acknowledge that Practitioner's advice, recommendations, and/or decision may be based on  factors not within their control, such as incomplete or inaccurate data provided by me or distortions of diagnostic images or specimens that may result from electronic transmissions. I understand that the practice of medicine is not an exact science and that Practitioner makes no warranties or guarantees regarding treatment outcomes. I acknowledge that I will receive a copy of this consent concurrently upon execution via email to the email address I last provided but may also request a printed copy by calling the office of [facility].    I understand that my insurance will be billed for this visit.   I have read or had this consent read to me. . I understand the contents of this consent, which adequately explains the benefits and risks of the Services being provided via telemedicine.  . I have been provided ample opportunity to ask questions regarding this consent and the Services and have had my questions answered to my satisfaction. . I give my informed consent for the services to be provided through the use of telemedicine in my medical care  By participating in this telemedicine visit I agree to the above.  

## 2019-03-18 ENCOUNTER — Telehealth: Payer: Self-pay | Admitting: Interventional Cardiology

## 2019-03-18 NOTE — Telephone Encounter (Signed)

## 2019-03-20 NOTE — Progress Notes (Signed)
Cardiology Office Note:    Date:  03/21/2019   ID:  Erik Perez, DOB 1946-03-16, MRN [id number]  PCP:  Hulan Fess, MD  Cardiologist:  No primary care provider on file.   Referring MD: Hulan Fess, MD   Chief Complaint  Patient presents with  . Coronary Artery Disease    History of Present Illness:    Erik Perez is a 73 y.o. male with a hx of coronary artery disease with prior bypass grafting (LIMA LAD; RIMA RCA, 2009) and Taxus stent in the native circumflex, hypertension, OSA on CPAP, prostate CA, hyperlipidemia, and bilateral carotid disease.  He is doing well.  He has not had angina or complaints to suggest myocardial ischemia.  Not exercising because of the COVID-19 pandemic.  He has not had claudication, transient neurological symptoms, or medication intolerances.  In the interval since our last visit he has been diagnosed with prostate cancer that is low-grade with only 2 of 12 biopsy specimens positive.  Past Medical History:  Diagnosis Date  . Carotid artery obstruction    Bilateral moderate carotid obstruction 2013 doppler  . Coronary artery disease   . Hyperlipidemia   . Hypertension   . OSA (obstructive sleep apnea)    Split 02-08-07 AHI total 32/hr; REM 60/hr O2 sat min NREM 88% REM 88%    Past Surgical History:  Procedure Laterality Date  . [place] SURGERY  2009  . CORONARY ARTERY BYPASS GRAFT     [place], to LAD, RIMA to RCA 2000,Cfx. Taxus stent 2002    Current Medications: Current Meds  Medication Sig  . allopurinol (ZYLOPRIM) 100 MG tablet Take 100 mg by mouth 2 (two) times daily.   Marland Kitchen amLODipine (NORVASC) 5 MG tablet Take 5 mg by mouth 2 (two) times daily.  Marland Kitchen aspirin EC 81 MG tablet Take 81 mg by mouth daily.  Marland Kitchen atorvastatin (LIPITOR) 40 MG tablet Take 40 mg by mouth daily.  . calcium gluconate 500 MG tablet Take 1 tablet by mouth 3 (three) times daily.  . Cholecalciferol (VITAMIN D) 2000 UNITS tablet Take 2,000 Units by mouth  2 (two) times daily.   . Cyanocobalamin (B-12 IJ) Take one (1) injection into the muscle once a month.  . fexofenadine (ALLEGRA) 180 MG tablet Take 180 mg by mouth daily.  . hydrochlorothiazide (HYDRODIURIL) 25 MG tablet Take 25 mg by mouth daily.  Marland Kitchen lisinopril (PRINIVIL,ZESTRIL) 40 MG tablet Take 40 mg by mouth daily.  . metoprolol (LOPRESSOR) 50 MG tablet Take 1.5 tablets (75 mg total) by mouth each morning. Take 1 tablet (50 mg total) by mouth each evening.  . Multiple Vitamin (MULTIVITAMIN) tablet Take 1 tablet by mouth daily.  . nitroGLYCERIN (NITROSTAT) 0.4 MG SL tablet Place 0.4 mg under the tongue every 5 (five) minutes as needed for chest pain.  . Omega-3 Fatty Acids (FISH OIL) 1200 MG CAPS Take by mouth 2 (two) times daily.  Marland Kitchen SILDENAFIL CITRATE PO Take 20 mg by mouth daily as needed for erectile dysfunction.      Allergies:   Diclofenac sodium, Gabapentin, and Niaspan [niacin er]   Social History   Socioeconomic History  . Marital status: Married    Spouse name: Not on file  . Number of children: Not on file  . Years of education: Not on file  . Highest education level: Not on file  Occupational History  . Not on file  Social Needs  . Financial resource strain: Not on file  . Food  insecurity    Worry: Not on file    Inability: Not on file  . Transportation needs    Medical: Not on file    Non-medical: Not on file  Tobacco Use  . Smoking status: Former Research scientist (life sciences)  . Smokeless tobacco: Never Used  Substance and Sexual Activity  . Alcohol use: Yes    Comment: social  . Drug use: No  . Sexual activity: Not on file  Lifestyle  . Physical activity    Days per week: Not on file    Minutes per session: Not on file  . Stress: Not on file  Relationships  . Social Herbalist on phone: Not on file    Gets together: Not on file    Attends religious service: Not on file    Active member of club or organization: Not on file    Attends meetings of clubs or  organizations: Not on file    Relationship status: Not on file  Other Topics Concern  . Not on file  Social History Narrative  . Not on file     Family History: The patient's family history includes Heart disease in his brother, father, and mother.  ROS:   Please see the history of present illness.    Sleeps well.  Advocates compliance with CPAP.  All other systems reviewed and are negative.  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  The tracing performed on 03/21/2019 reveals sinus bradycardia 59 bpm, all anteroseptal infarction, nonspecific T wave flattening, and when compared to the prior historical EKG, no significant change has occurred.  Recent Labs: No results found for requested labs within last 8760 hours.  Recent Lipid Panel    Component Value Date/Time   CHOL 123 05/22/2014 1021   TRIG 135.0 05/22/2014 1021   HDL 28.60 (L) 05/22/2014 1021   CHOLHDL 4 05/22/2014 1021   VLDL 27.0 05/22/2014 1021   LDLCALC 67 05/22/2014 1021    Physical Exam:    VS:  BP (!) 142/76   Pulse (!) 59   Ht 6' (1.829 m)   Wt 225 lb 4.8 oz (102.2 kg)   SpO2 97%   BMI 30.56 kg/m     Wt Readings from Last 3 Encounters:  03/21/19 225 lb 4.8 oz (102.2 kg)  12/28/17 209 lb 6.4 oz (95 kg)  10/24/16 221 lb 12.8 oz (100.6 kg)     GEN: Obese. No acute distress HEENT: Normal NECK: No JVD. LYMPHATICS: No lymphadenopathy CARDIAC: RRR.  No murmur, S4 gallop, no edema VASCULAR: 2+ bilateral radial pulses, no bruits RESPIRATORY:  Clear to auscultation without rales, wheezing or rhonchi  ABDOMEN: Soft, non-tender, non-distended, No pulsatile mass, MUSCULOSKELETAL: No deformity  SKIN: Warm and dry NEUROLOGIC:  Alert and oriented x 3 PSYCHIATRIC:  Normal affect   ASSESSMENT:    1. Atherosclerosis of native coronary artery of native heart without  angina pectoris   2. Essential hypertension, benign   3. Mixed hyperlipidemia   4. Bilateral carotid artery stenosis   5. Educated About Covid-19 Virus Infection    PLAN:    In order of problems listed above:  1. Stable without anginal complaints.  Secondary risk factor modification discussed in detail. 2. Target blood pressure 130/80 mmHg.  Low-salt diet discussed.  Exercise and weight loss discussed. 3. Target LDL less than 70.  When last evaluated 1 year ago it was 40.  This is followed by Dr. Hulan Fess his primary care physician. 4. Bilateral carotid disease, followed yearly with carotid Doppler by Dr. Starleen Arms. 5. Social distancing, handwashing, and masking is advocated.  Overall education and awareness concerning primary/secondary risk prevention was discussed in detail: LDL less than 70, hemoglobin A1c less than 7, blood pressure target less than 130/80 mmHg, >150 minutes of moderate aerobic activity per week, avoidance of smoking, weight control (via diet and exercise), and continued surveillance/management of/for obstructive sleep apnea.    Medication Adjustments/Labs and Tests Ordered: Current medicines are reviewed at length with the patient today.  Concerns regarding medicines are outlined above.  Orders Placed This Encounter  Procedures  . EKG 12-Lead   No orders of the defined types were placed in this encounter.   Patient Instructions  Medication Instructions:  Your physician recommends that you continue on your current medications as directed. Please refer to the Current Medication list given to you today.  If you need a refill on your cardiac medications before your next appointment, please call your pharmacy.   Lab work: None If you have labs (blood work) drawn today and your tests are completely normal, you will receive your results only by: Marland Kitchen MyChart Message (if you have MyChart) OR . A paper copy in the mail If you have any lab test that is abnormal or we  need to change your treatment, we will call you to review the results.  Testing/Procedures: None  Follow-Up: At [facility], you and your health needs are our priority.  As part of our continuing mission to provide you with exceptional heart care, we have created designated Provider Care Teams.  These Care Teams include your primary Cardiologist (physician) and Advanced Practice Providers (APPs -  Physician Assistants and Nurse Practitioners) who all work together to provide you with the care you need, when you need it. You will need a follow up appointment in 12 months.  Please call our office 2 months in advance to schedule this appointment.  You may see Dr. Tamala Julian or one of the following Advanced Practice Providers on your designated Care Team:   Truitt Merle, NP Cecilie Kicks, NP . Kathyrn Drown, NP  Any Other Special Instructions Will Be Listed Below (If Applicable).       Signed, Sinclair Grooms, MD  03/21/2019 2:06 PM    [place]

## 2019-03-21 ENCOUNTER — Ambulatory Visit: Payer: Medicare Other | Admitting: Interventional Cardiology

## 2019-03-21 ENCOUNTER — Other Ambulatory Visit: Payer: Self-pay

## 2019-03-21 ENCOUNTER — Encounter: Payer: Self-pay | Admitting: Interventional Cardiology

## 2019-03-21 VITALS — BP 142/76 | HR 59 | Ht 72.0 in | Wt 225.3 lb

## 2019-03-21 DIAGNOSIS — I10 Essential (primary) hypertension: Secondary | ICD-10-CM | POA: Diagnosis not present

## 2019-03-21 DIAGNOSIS — I6523 Occlusion and stenosis of bilateral carotid arteries: Secondary | ICD-10-CM

## 2019-03-21 DIAGNOSIS — I2510 Atherosclerotic heart disease of native coronary artery without angina pectoris: Secondary | ICD-10-CM | POA: Diagnosis not present

## 2019-03-21 DIAGNOSIS — E782 Mixed hyperlipidemia: Secondary | ICD-10-CM

## 2019-03-21 DIAGNOSIS — Z7189 Other specified counseling: Secondary | ICD-10-CM

## 2019-03-21 NOTE — Patient Instructions (Addendum)

## 2019-04-05 ENCOUNTER — Other Ambulatory Visit: Payer: Self-pay

## 2019-04-05 ENCOUNTER — Encounter (INDEPENDENT_AMBULATORY_CARE_PROVIDER_SITE_OTHER): Payer: Medicare Other | Admitting: Ophthalmology

## 2019-04-05 DIAGNOSIS — H43813 Vitreous degeneration, bilateral: Secondary | ICD-10-CM | POA: Diagnosis not present

## 2019-04-05 DIAGNOSIS — H348312 Tributary (branch) retinal vein occlusion, right eye, stable: Secondary | ICD-10-CM | POA: Diagnosis not present

## 2019-04-05 DIAGNOSIS — I10 Essential (primary) hypertension: Secondary | ICD-10-CM | POA: Diagnosis not present

## 2019-04-05 DIAGNOSIS — H35033 Hypertensive retinopathy, bilateral: Secondary | ICD-10-CM

## 2019-04-08 ENCOUNTER — Ambulatory Visit: Payer: Medicare Other | Admitting: Interventional Cardiology

## 2019-07-04 ENCOUNTER — Encounter (INDEPENDENT_AMBULATORY_CARE_PROVIDER_SITE_OTHER): Payer: Medicare Other | Admitting: Ophthalmology

## 2020-04-04 ENCOUNTER — Other Ambulatory Visit: Payer: Self-pay

## 2020-04-04 ENCOUNTER — Encounter (INDEPENDENT_AMBULATORY_CARE_PROVIDER_SITE_OTHER): Payer: Medicare Other | Admitting: Ophthalmology

## 2020-04-04 DIAGNOSIS — H43813 Vitreous degeneration, bilateral: Secondary | ICD-10-CM | POA: Diagnosis not present

## 2020-04-04 DIAGNOSIS — H348312 Tributary (branch) retinal vein occlusion, right eye, stable: Secondary | ICD-10-CM

## 2020-04-04 DIAGNOSIS — I10 Essential (primary) hypertension: Secondary | ICD-10-CM

## 2020-04-04 DIAGNOSIS — H35033 Hypertensive retinopathy, bilateral: Secondary | ICD-10-CM | POA: Diagnosis not present

## 2020-05-06 NOTE — Progress Notes (Signed)
Cardiology Office Note:    Date:  05/07/2020   ID:  Erik Perez, DOB [birth year], MRN [id number]  PCP:  Hulan Fess, MD  Cardiologist:  No primary care provider on file.   Referring MD: Hulan Fess, MD   Chief Complaint  Patient presents with  . Coronary Artery Disease    History of Present Illness:    Erik Perez is a 74 y.o. male with a hx of coronary artery disease with prior bypass grafting (LIMA LAD; RIMA RCA, 2009) and Taxus stent in the native circumflex, hypertension, OSA on CPAP, prostate CA, hyperlipidemia, and bilateral carotid disease.  Doing well.  Denies chest pain.  Has not had syncope.  He has been safe during the Covid pandemic.  No episodes of syncope.  Has not been as faithful with exercise because of restrictions related to the pandemic.  No angina or nitroglycerin use.  Denies shortness of breath.  Past Medical History:  Diagnosis Date  . Carotid artery obstruction    Bilateral moderate carotid obstruction 2013 doppler  . Coronary artery disease   . Hyperlipidemia   . Hypertension   . OSA (obstructive sleep apnea)    Split 02-08-07 AHI total 32/hr; REM 60/hr O2 sat min NREM 88% REM 88%    Past Surgical History:  Procedure Laterality Date  . [place] SURGERY  2009  . CORONARY ARTERY BYPASS GRAFT     [place], to LAD, RIMA to RCA 2000,Cfx. Taxus stent 2002    Current Medications: Current Meds  Medication Sig  . allopurinol (ZYLOPRIM) 100 MG tablet Take 100 mg by mouth 2 (two) times daily.   Marland Kitchen amLODipine (NORVASC) 5 MG tablet Take 5 mg by mouth 2 (two) times daily.  Marland Kitchen aspirin EC 81 MG tablet Take 81 mg by mouth daily.  Marland Kitchen atorvastatin (LIPITOR) 40 MG tablet Take 40 mg by mouth daily.  . calcium gluconate 500 MG tablet Take 1 tablet by mouth 3 (three) times daily.  . Cholecalciferol (VITAMIN D) 2000 UNITS tablet Take 2,000 Units by mouth 2 (two) times daily.   . Cyanocobalamin (B-12 IJ) Take one (1) injection into the muscle once a  month.  . doxazosin (CARDURA) 4 MG tablet Take 4 mg by mouth daily.  . fexofenadine (ALLEGRA) 180 MG tablet Take 180 mg by mouth daily.  . hydrochlorothiazide (HYDRODIURIL) 25 MG tablet Take 25 mg by mouth daily.  Marland Kitchen lisinopril (PRINIVIL,ZESTRIL) 40 MG tablet Take 40 mg by mouth daily.  . metoprolol (LOPRESSOR) 50 MG tablet Take 1.5 tablets (75 mg total) by mouth each morning. Take 1 tablet (50 mg total) by mouth each evening.  . Multiple Vitamin (MULTIVITAMIN) tablet Take 1 tablet by mouth daily.  . nitroGLYCERIN (NITROSTAT) 0.4 MG SL tablet Place 0.4 mg under the tongue every 5 (five) minutes as needed for chest pain.  . Omega-3 Fatty Acids (FISH OIL) 1200 MG CAPS Take by mouth 2 (two) times daily.  Marland Kitchen SILDENAFIL CITRATE PO Take 20 mg by mouth daily as needed for erectile dysfunction.      Allergies:   Diclofenac sodium, Gabapentin, and Niaspan [niacin er]   Social History   Socioeconomic History  . Marital status: Married    Spouse name: Not on file  . Number of children: Not on file  . Years of education: Not on file  . Highest education level: Not on file  Occupational History  . Not on file  Tobacco Use  . Smoking status: Former Research scientist (life sciences)  . Smokeless  tobacco: Never Used  Substance and Sexual Activity  . Alcohol use: Yes    Comment: social  . Drug use: No  . Sexual activity: Not on file  Other Topics Concern  . Not on file  Social History Narrative  . Not on file   Social Determinants of Health   Financial Resource Strain:   . Difficulty of Paying Living Expenses:   Food Insecurity:   . Worried About Charity fundraiser in the Last Year:   . Arboriculturist in the Last Year:   Transportation Needs:   . Film/video editor (Medical):   Marland Kitchen Lack of Transportation (Non-Medical):   Physical Activity:   . Days of Exercise per Week:   . Minutes of Exercise per Session:   Stress:   . Feeling of Stress :   Social Connections:   . Frequency of Communication with Friends  and Family:   . Frequency of Social Gatherings with Friends and Family:   . Attends Religious Services:   . Active Member of Clubs or Organizations:   . Attends Archivist Meetings:   Marland Kitchen Marital Status:      Family History: The patient's family history includes Heart disease in his brother, father, and mother.  ROS:   Please see the history of present illness.    Mother-in-law died and recent estate sale caused him to be very sad.  All other systems reviewed and are negative.  EKGs/Labs/Other Studies Reviewed:    The following studies were reviewed today:  Bilateral Carotid Doppler 2019: IMPRESSION: 1. Bilateral carotid bifurcation plaque, resulting in less than 50% diameter left ICA stenosis, 50-69% right ICA stenosis. 2.  Antegrade bilateral vertebral arterial flow.  ABDOMINAL AORTIC ANEURYSM 2019: IMPRESSION: 3.4 cm distal abdominal aortic aneurysm. Recommend followup by ultrasound in 3 years. This recommendation follows ACR consensus guidelines: White Paper of the ACR Incidental Findings Committee II on Vascular Findings. J Am Coll Radiol 2013; [phone]  EKG:  EKG sinus bradycardia QS pattern V1 and V2, nonspecific T wave flattening, borderline short PR interval.  Recent Labs: No results found for requested labs within last 8760 hours.  Recent Lipid Panel    Component Value Date/Time   CHOL 123 05/22/2014 1021   TRIG 135.0 05/22/2014 1021   HDL 28.60 (L) 05/22/2014 1021   CHOLHDL 4 05/22/2014 1021   VLDL 27.0 05/22/2014 1021   LDLCALC 67 05/22/2014 1021    Physical Exam:    VS:  BP 124/64   Pulse (!) 58   Ht 6' (1.829 m)   Wt 219 lb 6.4 oz (99.5 kg)   SpO2 98%   BMI 29.76 kg/m     Wt Readings from Last 3 Encounters:  05/07/20 219 lb 6.4 oz (99.5 kg)  03/21/19 225 lb 4.8 oz (102.2 kg)  12/28/17 209 lb 6.4 oz (95 kg)     GEN: Obese. No acute distress HEENT: Normal NECK: No JVD. LYMPHATICS: No lymphadenopathy CARDIAC:  RRR without  murmur, gallop, or edema. VASCULAR:  Normal Pulses. No bruits. RESPIRATORY:  Clear to auscultation without rales, wheezing or rhonchi  ABDOMEN: Soft, non-tender, non-distended, No pulsatile mass, MUSCULOSKELETAL: No deformity  SKIN: Warm and dry NEUROLOGIC:  Alert and oriented x 3 PSYCHIATRIC:  Normal affect   ASSESSMENT:    1. Atherosclerosis of native coronary artery of native heart without angina pectoris   2. Essential hypertension, benign   3. Mixed hyperlipidemia   4. Bilateral carotid artery stenosis   5.  AAA (abdominal aortic aneurysm) without rupture ([place])   6. Educated about COVID-19 virus infection    PLAN:    In order of problems listed above:  1. Secondary prevention discussed.  Encouraged increasing physical activity. 2. 140/80 mmHg or less with an ideal blood pressure target of 130/80.  Weight loss, low-salt diet, and avoidance of nonsteroidals is encouraged. 3. LDL target less than 70.  Continue Lipitor 40 mg/day which resulted in an LDL of 64 in March 2020. 4. Carotid surveillance with bilateral carotid Doppler before 2021 is completed. 5. Will reevaluate abdominal aorta perhaps next year. 6. COVID-19 vaccinated and still practicing social distancing.   Medication Adjustments/Labs and Tests Ordered: Current medicines are reviewed at length with the patient today.  Concerns regarding medicines are outlined above.  Orders Placed This Encounter  Procedures  . EKG 12-Lead   No orders of the defined types were placed in this encounter.   There are no Patient Instructions on file for this visit.   Signed, Sinclair Grooms, MD  05/07/2020 9:08 AM    [place]

## 2020-05-07 ENCOUNTER — Ambulatory Visit: Payer: Medicare PPO | Admitting: Interventional Cardiology

## 2020-05-07 ENCOUNTER — Encounter: Payer: Self-pay | Admitting: Interventional Cardiology

## 2020-05-07 ENCOUNTER — Other Ambulatory Visit: Payer: Self-pay

## 2020-05-07 VITALS — BP 124/64 | HR 58 | Ht 72.0 in | Wt 219.4 lb

## 2020-05-07 DIAGNOSIS — I714 Abdominal aortic aneurysm, without rupture, unspecified: Secondary | ICD-10-CM

## 2020-05-07 DIAGNOSIS — Z7189 Other specified counseling: Secondary | ICD-10-CM

## 2020-05-07 DIAGNOSIS — I2510 Atherosclerotic heart disease of native coronary artery without angina pectoris: Secondary | ICD-10-CM

## 2020-05-07 DIAGNOSIS — I6523 Occlusion and stenosis of bilateral carotid arteries: Secondary | ICD-10-CM

## 2020-05-07 DIAGNOSIS — E782 Mixed hyperlipidemia: Secondary | ICD-10-CM

## 2020-05-07 DIAGNOSIS — I10 Essential (primary) hypertension: Secondary | ICD-10-CM | POA: Diagnosis not present

## 2020-05-07 NOTE — Patient Instructions (Signed)
Medication Instructions:  Your physician recommends that you continue on your current medications as directed. Please refer to the Current Medication list given to you today.  *If you need a refill on your cardiac medications before your next appointment, please call your pharmacy*   Lab Work: None If you have labs (blood work) drawn today and your tests are completely normal, you will receive your results only by: Marland Kitchen MyChart Message (if you have MyChart) OR . A paper copy in the mail If you have any lab test that is abnormal or we need to change your treatment, we will call you to review the results.   Testing/Procedures: Your physician has requested that you have a carotid duplex. This test is an ultrasound of the carotid arteries in your neck. It looks at blood flow through these arteries that supply the brain with blood. Allow one hour for this exam. There are no restrictions or special instructions.   Follow-Up: At [facility], you and your health needs are our priority.  As part of our continuing mission to provide you with exceptional heart care, we have created designated Provider Care Teams.  These Care Teams include your primary Cardiologist (physician) and Advanced Practice Providers (APPs -  Physician Assistants and Nurse Practitioners) who all work together to provide you with the care you need, when you need it.  We recommend signing up for the patient portal called "MyChart".  Sign up information is provided on this After Visit Summary.  MyChart is used to connect with patients for Virtual Visits (Telemedicine).  Patients are able to view lab/test results, encounter notes, upcoming appointments, etc.  Non-urgent messages can be sent to your provider as well.   To learn more about what you can do with MyChart, go to [email or web address].    Your next appointment:   12 month(s)  The format for your next appointment:   In Person  Provider:   You may see Dr. Daneen Schick  or one of the following Advanced Practice Providers on your designated Care Team:    Truitt Merle, NP  Cecilie Kicks, NP  Kathyrn Drown, NP    Other Instructions

## 2020-05-28 ENCOUNTER — Ambulatory Visit (HOSPITAL_COMMUNITY)
Admission: RE | Admit: 2020-05-28 | Payer: Medicare PPO | Source: Ambulatory Visit | Attending: Interventional Cardiology | Admitting: Interventional Cardiology

## 2020-06-05 ENCOUNTER — Ambulatory Visit (HOSPITAL_COMMUNITY)
Admission: RE | Admit: 2020-06-05 | Discharge: 2020-06-05 | Disposition: A | Discharge status: Home / Self Care | Payer: Medicare PPO | Source: Ambulatory Visit | Attending: Cardiovascular Disease | Admitting: Cardiovascular Disease

## 2020-06-05 ENCOUNTER — Other Ambulatory Visit: Payer: Self-pay

## 2020-06-05 DIAGNOSIS — I6523 Occlusion and stenosis of bilateral carotid arteries: Secondary | ICD-10-CM | POA: Diagnosis present

## 2020-06-06 ENCOUNTER — Other Ambulatory Visit: Payer: Self-pay | Admitting: *Deleted

## 2020-06-06 DIAGNOSIS — I6523 Occlusion and stenosis of bilateral carotid arteries: Secondary | ICD-10-CM

## 2020-07-23 ENCOUNTER — Ambulatory Visit (INDEPENDENT_AMBULATORY_CARE_PROVIDER_SITE_OTHER): Payer: Medicare PPO | Admitting: Otolaryngology

## 2020-07-23 ENCOUNTER — Other Ambulatory Visit: Payer: Self-pay

## 2020-07-23 ENCOUNTER — Encounter (INDEPENDENT_AMBULATORY_CARE_PROVIDER_SITE_OTHER): Payer: Self-pay | Admitting: Otolaryngology

## 2020-07-23 VITALS — Temp 97.3°F

## 2020-07-23 DIAGNOSIS — H60313 Diffuse otitis externa, bilateral: Secondary | ICD-10-CM | POA: Diagnosis not present

## 2020-07-23 DIAGNOSIS — H6123 Impacted cerumen, bilateral: Secondary | ICD-10-CM

## 2020-07-23 NOTE — Progress Notes (Signed)
HPI: Erik Perez is a 74 y.o. male who presents for evaluation of small ear canals with the external otitis [facility] by [facility].  Patient has bilateral hearing aids with very small domes.  On recent examination at [facility] the left ear canal appeared obstructed with secondary otitis externa.. He has been using hydrogen peroxide on a weekly basis to clean his ear canals.  Past Medical History:  Diagnosis Date   Carotid artery obstruction    Bilateral moderate carotid obstruction 2013 doppler   Coronary artery disease    Hyperlipidemia    Hypertension    OSA (obstructive sleep apnea)    Split 02-08-07 AHI total 32/hr; REM 60/hr O2 sat min NREM 88% REM 88%   Past Surgical History:  Procedure Laterality Date   CERVICAL DISC SURGERY  2009   CORONARY ARTERY BYPASS GRAFT     [place], to LAD, RIMA to RCA 2000,Cfx. Taxus stent 2002   Social History   Socioeconomic History   Marital status: Married    Spouse name: Not on file   Number of children: Not on file   Years of education: Not on file   Highest education level: Not on file  Occupational History   Not on file  Tobacco Use   Smoking status: Former Smoker    Packs/day: 0.33    Years: 23.00    Pack years: 7.59    Start date: 1962    Quit date: 1985    Years since quitting: 36.8   Smokeless tobacco: Never Used  Substance and Sexual Activity   Alcohol use: Yes    Comment: social   Drug use: No   Sexual activity: Not on file  Other Topics Concern   Not on file  Social History Narrative   Not on file   Social Determinants of Health   Financial Resource Strain:    Difficulty of Paying Living Expenses: Not on file  Food Insecurity:    Worried About Charity fundraiser in the Last Year: Not on file   [employer] of Food in the Last Year: Not on file  Transportation Needs:    Lack of Transportation (Medical): Not on file   Lack of Transportation (Non-Medical): Not on file   Physical Activity:    Days of Exercise per Week: Not on file   Minutes of Exercise per Session: Not on file  Stress:    Feeling of Stress : Not on file  Social Connections:    Frequency of Communication with Friends and Family: Not on file   Frequency of Social Gatherings with Friends and Family: Not on file   Attends Religious Services: Not on file   Active Member of Clubs or Organizations: Not on file   Attends Archivist Meetings: Not on file   Marital Status: Not on file   Family History  Problem Relation Age of Onset   Heart disease Mother    Heart disease Father    Heart disease Brother    Allergies  Allergen Reactions   Diclofenac Sodium     Chest Pain   Gabapentin     Increased sedation    Niaspan [Niacin Er]     Involuntary facial flushing   Prior to Admission medications   Medication Sig Start Date End Date Taking? Authorizing Provider  allopurinol (ZYLOPRIM) 100 MG tablet Take 100 mg by mouth 2 (two) times daily.    Yes [provider]  amLODipine (NORVASC) 5 MG tablet Take 5  mg by mouth 2 (two) times daily.   Yes [provider]  aspirin EC 81 MG tablet Take 81 mg by mouth daily.   Yes [provider]  atorvastatin (LIPITOR) 40 MG tablet Take 40 mg by mouth daily.   Yes [provider]  calcium gluconate 500 MG tablet Take 1 tablet by mouth 3 (three) times daily.   Yes [provider]  Cholecalciferol (VITAMIN D) 2000 UNITS tablet Take 2,000 Units by mouth 2 (two) times daily.    Yes [provider]  Cyanocobalamin (B-12 IJ) Take one (1) injection into the muscle once a month.   Yes [provider]  doxazosin (CARDURA) 4 MG tablet Take 4 mg by mouth daily. 03/27/20  Yes [provider]  fexofenadine (ALLEGRA) 180 MG tablet Take 180 mg by mouth daily.   Yes [provider]  hydrochlorothiazide (HYDRODIURIL) 25 MG tablet Take 25 mg by mouth daily.   Yes  [provider]  lisinopril (PRINIVIL,ZESTRIL) 40 MG tablet Take 40 mg by mouth daily.   Yes [provider]  metoprolol (LOPRESSOR) 50 MG tablet Take 1.5 tablets (75 mg total) by mouth each morning. Take 1 tablet (50 mg total) by mouth each evening.   Yes [provider]  Multiple Vitamin (MULTIVITAMIN) tablet Take 1 tablet by mouth daily.   Yes [provider]  nitroGLYCERIN (NITROSTAT) 0.4 MG SL tablet Place 0.4 mg under the tongue every 5 (five) minutes as needed for chest pain.   Yes [provider]  Omega-3 Fatty Acids (FISH OIL) 1200 MG CAPS Take by mouth 2 (two) times daily.   Yes [provider]  SILDENAFIL CITRATE PO Take 20 mg by mouth daily as needed for erectile dysfunction.    Yes [provider]     Positive ROS: Otherwise negative  All other systems have been reviewed and were otherwise negative with the exception of those mentioned in the HPI and as above.  Physical Exam: Constitutional: Alert, well-appearing, no acute distress Ears: External ears without lesions or tenderness. Ear canals are small bilaterally with mild inflammatory changes in both ear canals.  Both ear canals were cleaned with suction not cleaning the ear canals I applied CSF powder to both ear canals.. Nasal: External nose without lesions. Clear nasal passages Oral: Oropharynx clear. Neck: No palpable adenopathy or masses Respiratory: Breathing comfortably  Skin: No facial/neck lesions or rash noted.  Cerumen impaction removal  Date/Time: 07/23/2020 5:28 PM Performed by: Rozetta Nunnery, MD Authorized by: Rozetta Nunnery, MD   Consent:    Consent obtained:  Verbal   Consent given by:  Patient   Risks discussed:  Pain and bleeding Procedure details:    Location:  L ear and R ear   Procedure type: curette and suction   Post-procedure details:    Inspection:  TM intact and canal normal   Hearing quality:  Improved    Patient tolerance of procedure:  Tolerated well, no immediate complications Comments:     Small ear canals bilaterally.  Slight inflammatory changes of both ear canals.  After cleaning the ear canals I applied CSF HC powder.  Both TMs were clear.    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  Plan: Prescribed Cortisporin otic suspension drops 3 to 4 drops twice daily as needed itching or pain in the ear canals.  Also reviewed with him concerning use of  alcohol vinegar ear rinses at night to help with itching.  I would otherwise try to keep ear canals dry as this will lead to more problems with external otitis. He will follow-up as needed.  Radene Journey, MD

## 2020-07-25 ENCOUNTER — Other Ambulatory Visit: Payer: Self-pay | Admitting: Urology

## 2020-07-25 DIAGNOSIS — C61 Malignant neoplasm of prostate: Secondary | ICD-10-CM

## 2020-11-07 DIAGNOSIS — E538 Deficiency of other specified B group vitamins: Secondary | ICD-10-CM | POA: Diagnosis not present

## 2020-12-29 ENCOUNTER — Other Ambulatory Visit: Payer: Self-pay

## 2020-12-29 ENCOUNTER — Ambulatory Visit
Admission: RE | Admit: 2020-12-29 | Discharge: 2020-12-29 | Disposition: A | Discharge status: Home / Self Care | Payer: Medicare PPO | Source: Ambulatory Visit | Attending: Urology | Admitting: Urology

## 2020-12-29 DIAGNOSIS — C61 Malignant neoplasm of prostate: Secondary | ICD-10-CM

## 2020-12-29 MED ORDER — GADOBENATE DIMEGLUMINE 529 MG/ML IV SOLN
20.0000 mL | Freq: Once | INTRAVENOUS | Status: AC | PRN
  Administered 2020-12-29: 20 mL via INTRAVENOUS

## 2021-03-20 ENCOUNTER — Telehealth: Payer: Self-pay | Admitting: Interventional Cardiology

## 2021-03-20 DIAGNOSIS — I714 Abdominal aortic aneurysm, without rupture, unspecified: Secondary | ICD-10-CM

## 2021-03-20 NOTE — Telephone Encounter (Signed)
Pt had medicare screening for abd aorta 07/28/2018.    IMPRESSION: 3.4 cm distal abdominal aortic aneurysm. Recommend followup by ultrasound in 3 years. This recommendation follows ACR consensus guidelines: White Paper of the ACR Incidental Findings Committee II on Vascular Findings. Joellyn Rued Radiol 2013; [phone]    Has carotids in Sept and f/u with Dr. Tamala Julian in November.    Will place order for AAA duplex to be done w carotids and send message to Dr. Tamala Julian and his nurse to follow up upon return to office.  Will send message to scheduling to arrange.

## 2021-03-20 NOTE — Telephone Encounter (Signed)
Patient called to schedule his yearly follow up and carotid. He would like to know if he needs to have a AAA duplex as well. No order placed. Please advise.

## 2021-04-10 ENCOUNTER — Other Ambulatory Visit: Payer: Self-pay

## 2021-04-10 ENCOUNTER — Encounter (INDEPENDENT_AMBULATORY_CARE_PROVIDER_SITE_OTHER): Payer: Medicare PPO | Admitting: Ophthalmology

## 2021-04-10 DIAGNOSIS — H43813 Vitreous degeneration, bilateral: Secondary | ICD-10-CM

## 2021-04-10 DIAGNOSIS — H348312 Tributary (branch) retinal vein occlusion, right eye, stable: Secondary | ICD-10-CM

## 2021-04-10 DIAGNOSIS — H35033 Hypertensive retinopathy, bilateral: Secondary | ICD-10-CM

## 2021-04-10 DIAGNOSIS — I10 Essential (primary) hypertension: Secondary | ICD-10-CM

## 2021-05-22 DIAGNOSIS — E538 Deficiency of other specified B group vitamins: Secondary | ICD-10-CM | POA: Diagnosis not present

## 2021-05-30 ENCOUNTER — Other Ambulatory Visit (HOSPITAL_COMMUNITY): Payer: Self-pay | Admitting: Interventional Cardiology

## 2021-05-30 ENCOUNTER — Other Ambulatory Visit: Payer: Self-pay

## 2021-05-30 ENCOUNTER — Ambulatory Visit (HOSPITAL_COMMUNITY)
Admission: RE | Admit: 2021-05-30 | Discharge: 2021-05-30 | Disposition: A | Discharge status: Home / Self Care | Payer: Medicare PPO | Source: Ambulatory Visit | Attending: Internal Medicine | Admitting: Internal Medicine

## 2021-05-30 ENCOUNTER — Ambulatory Visit (HOSPITAL_BASED_OUTPATIENT_CLINIC_OR_DEPARTMENT_OTHER)
Admission: RE | Admit: 2021-05-30 | Discharge: 2021-05-30 | Disposition: A | Discharge status: Home / Self Care | Payer: Medicare PPO | Source: Ambulatory Visit | Attending: Interventional Cardiology | Admitting: Interventional Cardiology

## 2021-05-30 DIAGNOSIS — I6523 Occlusion and stenosis of bilateral carotid arteries: Secondary | ICD-10-CM | POA: Diagnosis not present

## 2021-05-30 DIAGNOSIS — I714 Abdominal aortic aneurysm, without rupture, unspecified: Secondary | ICD-10-CM

## 2021-06-04 NOTE — Progress Notes (Signed)
Pt has been made aware of his VAS AAA results and the need for it to be repeated in 2 years.   jw

## 2021-06-19 DIAGNOSIS — Z23 Encounter for immunization: Secondary | ICD-10-CM | POA: Diagnosis not present

## 2021-06-19 DIAGNOSIS — E538 Deficiency of other specified B group vitamins: Secondary | ICD-10-CM | POA: Diagnosis not present

## 2021-07-09 DIAGNOSIS — C61 Malignant neoplasm of prostate: Secondary | ICD-10-CM | POA: Diagnosis not present

## 2021-07-10 DIAGNOSIS — Z23 Encounter for immunization: Secondary | ICD-10-CM | POA: Diagnosis not present

## 2021-07-16 DIAGNOSIS — N5201 Erectile dysfunction due to arterial insufficiency: Secondary | ICD-10-CM | POA: Diagnosis not present

## 2021-07-16 DIAGNOSIS — C61 Malignant neoplasm of prostate: Secondary | ICD-10-CM | POA: Diagnosis not present

## 2021-07-17 DIAGNOSIS — E538 Deficiency of other specified B group vitamins: Secondary | ICD-10-CM | POA: Diagnosis not present

## 2021-08-07 NOTE — Progress Notes (Signed)
Cardiology Office Note:    Date:  08/08/2021   ID:  Erik Perez, DOB 1946/04/18, MRN [id number]  PCP:  Aretta Nip, MD  Cardiologist:  Sinclair Grooms, MD   Referring MD: Hulan Fess, MD   Chief Complaint  Patient presents with   Coronary Artery Disease   Atrial Fibrillation     History of Present Illness:    Erik Perez is a 75 y.o. male with a hx of coronary artery disease with prior bypass grafting (LIMA LAD; RIMA RCA, 2009) and Taxus stent in the native circumflex, hypertension, OSA on CPAP, prostate CA, hyperlipidemia, and bilateral carotid disease.  He has not had angina, palpitations, significant dyspnea, orthopnea, lower extremity edema, or other complaints.  No medication side effects.  He brought multiple blood pressure recordings all of which have been within a range that is very acceptable usually in the 130/70 mmHg range.  Rare blood pressures of 419 systolic.  Past Medical History:  Diagnosis Date   Carotid artery obstruction    Bilateral moderate carotid obstruction 2013 doppler   Coronary artery disease    Hyperlipidemia    Hypertension    OSA (obstructive sleep apnea)    Split 02-08-07 AHI total 32/hr; REM 60/hr O2 sat min NREM 88% REM 88%    Past Surgical History:  Procedure Laterality Date   CERVICAL DISC SURGERY  2009   CORONARY ARTERY BYPASS GRAFT     [place], to LAD, RIMA to RCA 2000,Cfx. Taxus stent 2002    Current Medications: Current Meds  Medication Sig   allopurinol (ZYLOPRIM) 100 MG tablet Take 100 mg by mouth 2 (two) times daily.    amLODipine (NORVASC) 5 MG tablet Take 5 mg by mouth 2 (two) times daily.   aspirin EC 81 MG tablet Take 81 mg by mouth daily.   atorvastatin (LIPITOR) 40 MG tablet Take 40 mg by mouth daily.   calcium gluconate 500 MG tablet Take 1 tablet by mouth 3 (three) times daily.   Cholecalciferol (VITAMIN D) 2000 UNITS tablet Take 2,000 Units by mouth 2 (two) times daily.    Cyanocobalamin  (B-12 IJ) Take one (1) injection into the muscle once a month.   doxazosin (CARDURA) 4 MG tablet Take 4 mg by mouth daily.   fexofenadine (ALLEGRA) 180 MG tablet Take 180 mg by mouth daily.   hydrochlorothiazide (HYDRODIURIL) 25 MG tablet Take 25 mg by mouth daily.   lisinopril (PRINIVIL,ZESTRIL) 40 MG tablet Take 40 mg by mouth daily.   metoprolol (LOPRESSOR) 50 MG tablet Take 1.5 tablets (75 mg total) by mouth each morning. Take 1 tablet (50 mg total) by mouth each evening.   Multiple Vitamin (MULTIVITAMIN) tablet Take 1 tablet by mouth daily.   nitroGLYCERIN (NITROSTAT) 0.4 MG SL tablet Place 0.4 mg under the tongue every 5 (five) minutes as needed for chest pain.   Omega-3 Fatty Acids (FISH OIL) 1200 MG CAPS Take by mouth 2 (two) times daily.   SILDENAFIL CITRATE PO Take 100 mg by mouth daily as needed for erectile dysfunction.     Allergies:   Diclofenac sodium, Gabapentin, and Niaspan [niacin er]   Social History   Socioeconomic History   Marital status: Married    Spouse name: Not on file   Number of children: Not on file   Years of education: Not on file   Highest education level: Not on file  Occupational History   Not on file  Tobacco Use   Smoking status:  Former    Packs/day: 0.33    Years: 23.00    Pack years: 7.59    Types: Cigarettes    Start date: 1962    Quit date: 1985    Years since quitting: 37.8   Smokeless tobacco: Never  Substance and Sexual Activity   Alcohol use: Yes    Comment: social   Drug use: No   Sexual activity: Not on file  Other Topics Concern   Not on file  Social History Narrative   Not on file   Social Determinants of Health   Financial Resource Strain: Not on file  Food Insecurity: Not on file  Transportation Needs: Not on file  Physical Activity: Not on file  Stress: Not on file  Social Connections: Not on file     Family History: The patient's family history includes Heart disease in his brother, father, and mother.  ROS:    Please see the history of present illness.   Knee arthritis limits physical activity. Abdomen is distended but not different.  Prostate cancer being followed by Dr. Bess Harvest.  No blood in the urine or stool.  All other systems reviewed and are negative.  EKGs/Labs/Other Studies Reviewed:    The following studies were reviewed today:  Abdominal ultrasound with duplex 05/30/2021: Summary:  Abdominal Aorta: There is evidence of abnormal dilatation of the distal  Abdominal aorta. There is evidence of abnormal dilation of the Right  Common Iliac artery. The largest aortic measurement is 3.4 cm. The largest  aortic diameter remains essentially  unchanged compared to prior exam. Previous diameter measurement was 3.4 cm  obtained on 07/28/2018.   Stenosis:  Patent bilateral common and external iliac arteries without evidence of  stenosis.   IVC/Iliac: Patent IVC.     *See table(s) above for measurements and observations.  Suggest follow up study in 24 months.     Electronically signed by Larae Grooms MD on 05/31/2021 at 10:18:35 AM.   Bilateral carotid Doppler done 05/31/2021: Summary:  Right Carotid: Velocities in the right ICA are consistent with a 40-59%                 stenosis. Non-hemodynamically significant plaque <50% noted  in                 the CCA. Stable RICA velocities.   Left Carotid: Velocities in the left ICA are consistent with a 1-39%  stenosis.                Non-hemodynamically significant plaque <50% noted in the  CCA.                Stable LICA velocities.   Vertebrals:  Bilateral vertebral arteries demonstrate antegrade flow.  Subclavians: Right subclavian artery flow was disturbed. Normal flow               hemodynamics were seen in the left subclavian artery.   *See table(s) above for measurements and observations.  Suggest follow up study in 12 months.     EKG:  EKG sinus bradycardia at 53 bpm, short PR interval, interventricular conduction delay,  vertical axis, and when compared with prior tracing from August 2021, the heart rate is slower.  Recent Labs: No results found for requested labs within last 8760 hours.  Recent Lipid Panel    Component Value Date/Time   CHOL 123 05/22/2014 1021   TRIG 135.0 05/22/2014 1021   HDL 28.60 (L) 05/22/2014 1021   CHOLHDL 4 05/22/2014  1021   VLDL 27.0 05/22/2014 1021   LDLCALC 67 05/22/2014 1021    Physical Exam:    VS:  BP (!) 142/62   Pulse (!) 53   Ht 5\' 11"  (1.803 m)   Wt 226 lb 9.6 oz (102.8 kg)   SpO2 98%   BMI 31.60 kg/m     Wt Readings from Last 3 Encounters:  08/08/21 226 lb 9.6 oz (102.8 kg)  05/07/20 219 lb 6.4 oz (99.5 kg)  03/21/19 225 lb 4.8 oz (102.2 kg)     GEN: Obese. No acute distress HEENT: Normal NECK: No JVD. LYMPHATICS: No lymphadenopathy CARDIAC: No murmur. RRR no gallop, or edema. VASCULAR:  Normal Pulses. No bruits. RESPIRATORY:  Clear to auscultation without rales, wheezing or rhonchi  ABDOMEN: Soft, non-tender, non-distended, No pulsatile mass, MUSCULOSKELETAL: No deformity  SKIN: Warm and dry NEUROLOGIC:  Alert and oriented x 3 PSYCHIATRIC:  Normal affect   ASSESSMENT:    1. Atherosclerosis of native coronary artery of native heart without angina pectoris   2. Abdominal aortic aneurysm (AAA) without rupture, unspecified part   3. Essential hypertension, benign   4. Mixed hyperlipidemia   5. Bilateral carotid artery stenosis    PLAN:    In order of problems listed above:  Secondary prevention reviewed.  Encouraged to increase physical activity.  Limited by orthopedic issues especially in his knees. Abdominal aorta is less than 3.8 cm in diameter.  Can do a repeat duplex in 2 years. Blood pressure is under excellent control on Cardura, HydroDIURIL, Zestril, Lopressor along with amlodipine. Continue Lipitor 40 mg/day.  Last LDL was 47 in June. Carotid disease is stable.  Continue secondary prevention.  Repeat carotid study in 1  year.   Overall education and awareness concerning secondary risk prevention was discussed in detail: LDL less than 70, hemoglobin A1c less than 7, blood pressure target less than 130/80 mmHg, >150 minutes of moderate aerobic activity per week, avoidance of smoking, weight control (via diet and exercise), and continued surveillance/management of/for obstructive sleep apnea.   Medication Adjustments/Labs and Tests Ordered: Current medicines are reviewed at length with the patient today.  Concerns regarding medicines are outlined above.  Orders Placed This Encounter  Procedures   EKG 12-Lead   No orders of the defined types were placed in this encounter.   Patient Instructions  Medication Instructions:  Your physician recommends that you continue on your current medications as directed. Please refer to the Current Medication list given to you today.  *If you need a refill on your cardiac medications before your next appointment, please call your pharmacy*   Lab Work: None If you have labs (blood work) drawn today and your tests are completely normal, you will receive your results only by: [place] (if you have MyChart) OR A paper copy in the mail If you have any lab test that is abnormal or we need to change your treatment, we will call you to review the results.   Testing/Procedures: Your physician has requested that you have a carotid duplex 1-2 weeks prior to seeing Dr. Tamala Julian back in one year. This test is an ultrasound of the carotid arteries in your neck. It looks at blood flow through these arteries that supply the brain with blood. Allow one hour for this exam. There are no restrictions or special instructions.   Follow-Up: At [facility], you and your health needs are our priority.  As part of our continuing mission to provide you with exceptional heart  care, we have created designated Provider Care Teams.  These Care Teams include your primary Cardiologist  (physician) and Advanced Practice Providers (APPs -  Physician Assistants and Nurse Practitioners) who all work together to provide you with the care you need, when you need it.  We recommend signing up for the patient portal called "MyChart".  Sign up information is provided on this After Visit Summary.  MyChart is used to connect with patients for Virtual Visits (Telemedicine).  Patients are able to view lab/test results, encounter notes, upcoming appointments, etc.  Non-urgent messages can be sent to your provider as well.   To learn more about what you can do with MyChart, go to [email or web address].    Your next appointment:   1 year(s)  The format for your next appointment:   In Person  Provider:   Sinclair Grooms, MD     Other Instructions     Signed, Sinclair Grooms, MD  08/08/2021 4:13 PM    [place]

## 2021-08-08 ENCOUNTER — Ambulatory Visit: Payer: Medicare PPO | Admitting: Interventional Cardiology

## 2021-08-08 ENCOUNTER — Encounter: Payer: Self-pay | Admitting: Interventional Cardiology

## 2021-08-08 ENCOUNTER — Other Ambulatory Visit: Payer: Self-pay

## 2021-08-08 VITALS — BP 142/62 | HR 53 | Ht 71.0 in | Wt 226.6 lb

## 2021-08-08 DIAGNOSIS — I10 Essential (primary) hypertension: Secondary | ICD-10-CM

## 2021-08-08 DIAGNOSIS — I6523 Occlusion and stenosis of bilateral carotid arteries: Secondary | ICD-10-CM

## 2021-08-08 DIAGNOSIS — I714 Abdominal aortic aneurysm, without rupture, unspecified: Secondary | ICD-10-CM

## 2021-08-08 DIAGNOSIS — I2510 Atherosclerotic heart disease of native coronary artery without angina pectoris: Secondary | ICD-10-CM | POA: Diagnosis not present

## 2021-08-08 DIAGNOSIS — E782 Mixed hyperlipidemia: Secondary | ICD-10-CM

## 2021-08-08 NOTE — Patient Instructions (Signed)
Medication Instructions:  Your physician recommends that you continue on your current medications as directed. Please refer to the Current Medication list given to you today.  *If you need a refill on your cardiac medications before your next appointment, please call your pharmacy*   Lab Work: None If you have labs (blood work) drawn today and your tests are completely normal, you will receive your results only by: [place] (if you have MyChart) OR A paper copy in the mail If you have any lab test that is abnormal or we need to change your treatment, we will call you to review the results.   Testing/Procedures: Your physician has requested that you have a carotid duplex 1-2 weeks prior to seeing Dr. Tamala Julian back in one year. This test is an ultrasound of the carotid arteries in your neck. It looks at blood flow through these arteries that supply the brain with blood. Allow one hour for this exam. There are no restrictions or special instructions.   Follow-Up: At [facility], you and your health needs are our priority.  As part of our continuing mission to provide you with exceptional heart care, we have created designated Provider Care Teams.  These Care Teams include your primary Cardiologist (physician) and Advanced Practice Providers (APPs -  Physician Assistants and Nurse Practitioners) who all work together to provide you with the care you need, when you need it.  We recommend signing up for the patient portal called "MyChart".  Sign up information is provided on this After Visit Summary.  MyChart is used to connect with patients for Virtual Visits (Telemedicine).  Patients are able to view lab/test results, encounter notes, upcoming appointments, etc.  Non-urgent messages can be sent to your provider as well.   To learn more about what you can do with MyChart, go to [email or web address].    Your next appointment:   1 year(s)  The format for your next appointment:   In  Person  Provider:   Sinclair Grooms, MD     Other Instructions

## 2021-08-13 DIAGNOSIS — D125 Benign neoplasm of sigmoid colon: Secondary | ICD-10-CM | POA: Diagnosis not present

## 2021-08-13 DIAGNOSIS — Z86010 Personal history of colonic polyps: Secondary | ICD-10-CM | POA: Diagnosis not present

## 2021-08-13 DIAGNOSIS — K648 Other hemorrhoids: Secondary | ICD-10-CM | POA: Diagnosis not present

## 2021-08-13 DIAGNOSIS — D122 Benign neoplasm of ascending colon: Secondary | ICD-10-CM | POA: Diagnosis not present

## 2021-08-14 DIAGNOSIS — E539 Vitamin B deficiency, unspecified: Secondary | ICD-10-CM | POA: Diagnosis not present

## 2021-08-16 DIAGNOSIS — D122 Benign neoplasm of ascending colon: Secondary | ICD-10-CM | POA: Diagnosis not present

## 2021-08-16 DIAGNOSIS — L918 Other hypertrophic disorders of the skin: Secondary | ICD-10-CM | POA: Diagnosis not present

## 2021-08-16 DIAGNOSIS — L300 Nummular dermatitis: Secondary | ICD-10-CM | POA: Diagnosis not present

## 2021-08-16 DIAGNOSIS — D692 Other nonthrombocytopenic purpura: Secondary | ICD-10-CM | POA: Diagnosis not present

## 2021-08-16 DIAGNOSIS — L821 Other seborrheic keratosis: Secondary | ICD-10-CM | POA: Diagnosis not present

## 2021-08-16 DIAGNOSIS — D125 Benign neoplasm of sigmoid colon: Secondary | ICD-10-CM | POA: Diagnosis not present

## 2021-08-16 DIAGNOSIS — D3613 Benign neoplasm of peripheral nerves and autonomic nervous system of lower limb, including hip: Secondary | ICD-10-CM | POA: Diagnosis not present

## 2021-08-16 DIAGNOSIS — D225 Melanocytic nevi of trunk: Secondary | ICD-10-CM | POA: Diagnosis not present

## 2021-08-17 IMAGING — MR MR PROSTATE WO/W CM
12 series · 48 of 48 positions shown · IV contrast (multihance)
Comparison: December 16, 2018

CLINICAL DATA: Prostate cancer, positive biopsy and elevated PSA in
a 75-year-old male. Gleason 6 disease on prior biopsy.

EXAM:
MR PROSTATE WITHOUT AND WITH CONTRAST
TECHNIQUE: Multiplanar multisequence MRI images were obtained of the pelvis
centered about the prostate. Pre and post contrast images were
obtained.
CONTRAST:  20mL MULTIHANCE GADOBENATE DIMEGLUMINE 529 MG/ML IV SOLN

[Series 3: T2 · coronal · 3.0mm · 0.56mm/px · 1 of 25 slices shown (1 of 3)]
[im 1/25]
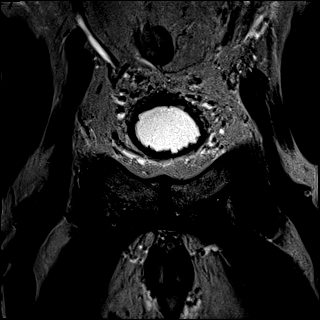

[Series 4: T1 · axial · 5.0mm · 1.25mm/px · 1 of 80 slices shown]
[im 1/80]
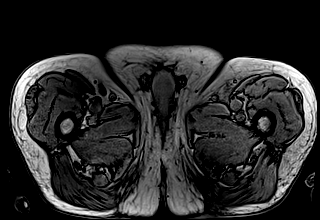

[Series 5: DWI · axial · 3.0mm · 1.75mm/px · 1 of 84 slices shown (1 of 3)]
[im 1/84]
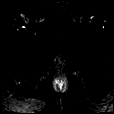

[Series 6: DWI · axial · 3.0mm · 1.75mm/px · 1 of 28 slices shown (2 of 3)]
[im 1/28]
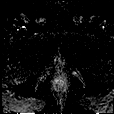

[Series 7: DWI · axial · 3.0mm · 1.75mm/px · 1 of 28 slices shown (3 of 3)]
[im 1/28]
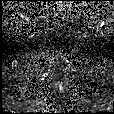

[Series 8: T2 · axial · 3.0mm · 0.56mm/px · 1 of 29 slices shown (2 of 3)]
[im 1/29]
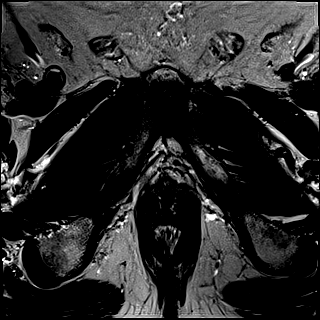

[Series 9: T2 · axial · 1.0mm · 1.04mm/px · z∈[-31,+48]mm · 2 of 80 slices shown (3 of 3)]
[im 1/80]
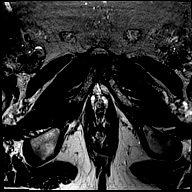
[im 80/80]
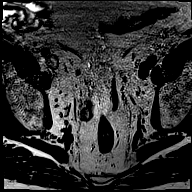

[Series 10: pre t1_twist_tra_dyn · axial · non-contrast · 3.5mm · 0.83mm/px · 1 of 24 slices shown]
[im 1/24]
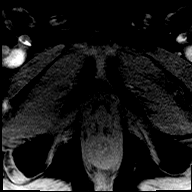

[Series 11: post t1_twist_tra_dyn-copy center · axial · non-contrast · 3.5mm · 0.83mm/px · z∈[-31,+49]mm · 18 of 720 slices shown]
[im 1/720]
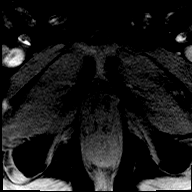
[im 43/720]
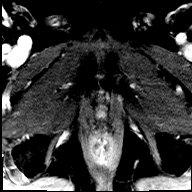
[im 85/720]
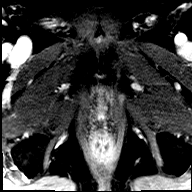
[im 127/720]
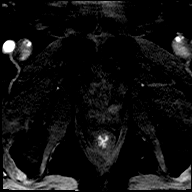
[im 170/720]
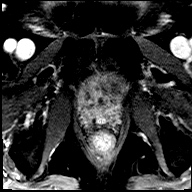
[im 212/720]
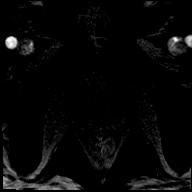
[im 254/720]
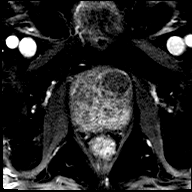
[im 297/720]
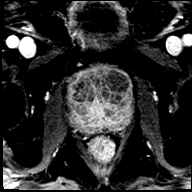
[im 339/720]
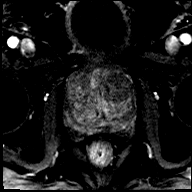
[im 381/720]
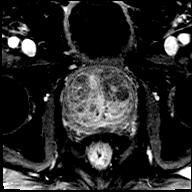
[im 423/720]
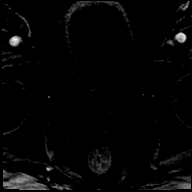
[im 466/720]
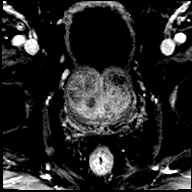
[im 508/720]
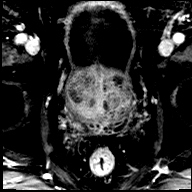
[im 550/720]
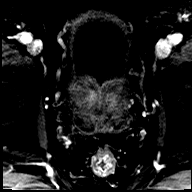
[im 593/720]
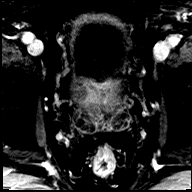
[im 635/720]
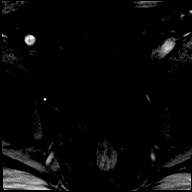
[im 677/720]
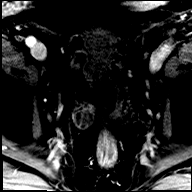
[im 720/720]
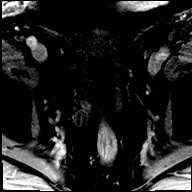

[Series 12: post t1_twist_tra_dyn-copy cent_sub · axial · 3.5mm · 0.83mm/px · z∈[-31,+49]mm · 17 of 696 slices shown]
[im 1/696]
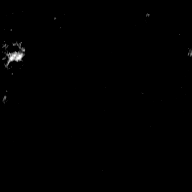
[im 44/696]
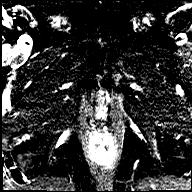
[im 87/696]
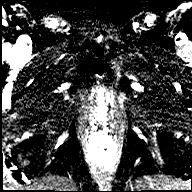
[im 131/696]
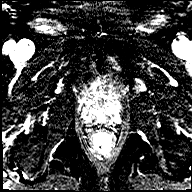
[im 174/696]
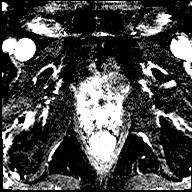
[im 218/696]
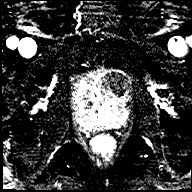
[im 261/696]
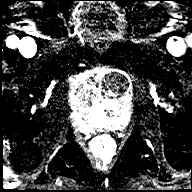
[im 305/696]
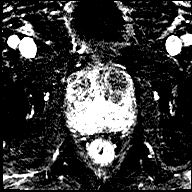
[im 348/696]
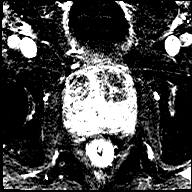
[im 391/696]
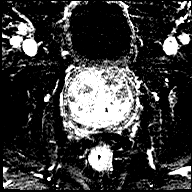
[im 435/696]
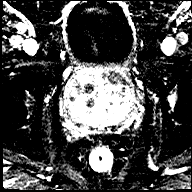
[im 478/696]
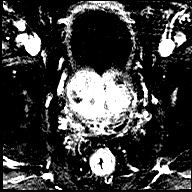
[im 522/696]
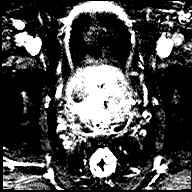
[im 565/696]
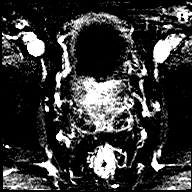
[im 609/696]
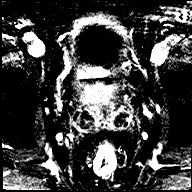
[im 652/696]
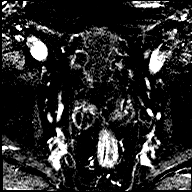
[im 696/696]
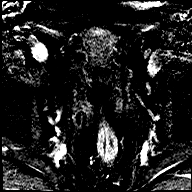

[Series 13: t1_vibe_dixon_tra_f · axial · 2.5mm · 0.91mm/px · z∈[-72,+125]mm · 2 of 80 slices shown]
[im 1/80]
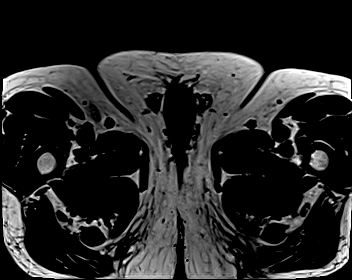
[im 80/80]
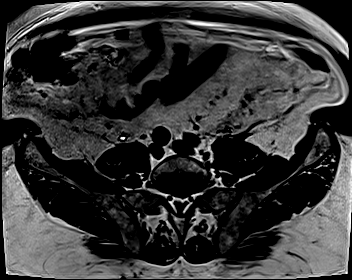

[Series 14: t1_vibe_dixon_tra_w · axial · 2.5mm · 0.91mm/px · z∈[-72,+125]mm · 2 of 80 slices shown]
[im 1/80]
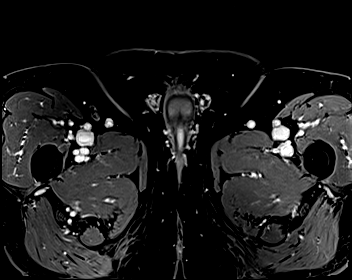
[im 80/80]
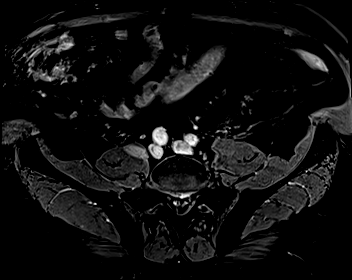

[48 of 48 positions shown; findings below may reference images not displayed]

FINDINGS: Prostate:

Transitional zone: Extensive BPH throughout the transitional zone.
No sign of high-risk lesion.

Peripheral zone: Thinning of the peripheral zone with areas of
linear and wedge-shaped T2 hypointensity, no sign of high-risk
lesion in the peripheral zone.

Volume: 6.4 x 5.9 x 6.7 (volume = 130 cc) cm

Transcapsular spread:  Absent

Seminal vesicle involvement: Absent

Neurovascular bundle involvement: Absent

Pelvic adenopathy: Absent

Bone metastasis: Absent

Other findings: None
IMPRESSION: 1. No signs of high-risk lesion. Overall assessment PIRADS category
2 a changes of BPH and sequela of prostatitis.

## 2021-08-21 DIAGNOSIS — Z1389 Encounter for screening for other disorder: Secondary | ICD-10-CM | POA: Diagnosis not present

## 2021-08-21 DIAGNOSIS — Z0000 Encounter for general adult medical examination without abnormal findings: Secondary | ICD-10-CM | POA: Diagnosis not present

## 2021-09-03 DIAGNOSIS — M1712 Unilateral primary osteoarthritis, left knee: Secondary | ICD-10-CM | POA: Diagnosis not present

## 2021-09-13 DIAGNOSIS — H61303 Acquired stenosis of external ear canal, unspecified, bilateral: Secondary | ICD-10-CM | POA: Diagnosis not present

## 2021-09-13 DIAGNOSIS — H6240 Otitis externa in other diseases classified elsewhere, unspecified ear: Secondary | ICD-10-CM | POA: Diagnosis not present

## 2021-09-13 DIAGNOSIS — H903 Sensorineural hearing loss, bilateral: Secondary | ICD-10-CM | POA: Insufficient documentation

## 2021-09-13 DIAGNOSIS — B369 Superficial mycosis, unspecified: Secondary | ICD-10-CM | POA: Diagnosis not present

## 2021-09-18 DIAGNOSIS — E538 Deficiency of other specified B group vitamins: Secondary | ICD-10-CM | POA: Diagnosis not present

## 2021-10-07 DIAGNOSIS — H61303 Acquired stenosis of external ear canal, unspecified, bilateral: Secondary | ICD-10-CM | POA: Diagnosis not present

## 2021-10-07 DIAGNOSIS — B369 Superficial mycosis, unspecified: Secondary | ICD-10-CM | POA: Diagnosis not present

## 2021-10-07 DIAGNOSIS — H903 Sensorineural hearing loss, bilateral: Secondary | ICD-10-CM | POA: Diagnosis not present

## 2021-10-07 DIAGNOSIS — H6240 Otitis externa in other diseases classified elsewhere, unspecified ear: Secondary | ICD-10-CM | POA: Diagnosis not present

## 2021-10-15 DIAGNOSIS — G4733 Obstructive sleep apnea (adult) (pediatric): Secondary | ICD-10-CM | POA: Diagnosis not present

## 2021-10-16 DIAGNOSIS — E538 Deficiency of other specified B group vitamins: Secondary | ICD-10-CM | POA: Diagnosis not present

## 2021-10-17 DIAGNOSIS — M25562 Pain in left knee: Secondary | ICD-10-CM | POA: Diagnosis not present

## 2021-11-04 DIAGNOSIS — N1832 Chronic kidney disease, stage 3b: Secondary | ICD-10-CM | POA: Diagnosis not present

## 2021-11-07 DIAGNOSIS — H61303 Acquired stenosis of external ear canal, unspecified, bilateral: Secondary | ICD-10-CM | POA: Diagnosis not present

## 2021-11-07 DIAGNOSIS — H938X3 Other specified disorders of ear, bilateral: Secondary | ICD-10-CM | POA: Diagnosis not present

## 2021-11-12 DIAGNOSIS — N2581 Secondary hyperparathyroidism of renal origin: Secondary | ICD-10-CM | POA: Diagnosis not present

## 2021-11-12 DIAGNOSIS — D631 Anemia in chronic kidney disease: Secondary | ICD-10-CM | POA: Diagnosis not present

## 2021-11-12 DIAGNOSIS — I129 Hypertensive chronic kidney disease with stage 1 through stage 4 chronic kidney disease, or unspecified chronic kidney disease: Secondary | ICD-10-CM | POA: Diagnosis not present

## 2021-11-12 DIAGNOSIS — N1832 Chronic kidney disease, stage 3b: Secondary | ICD-10-CM | POA: Diagnosis not present

## 2021-11-13 DIAGNOSIS — E538 Deficiency of other specified B group vitamins: Secondary | ICD-10-CM | POA: Diagnosis not present

## 2021-12-11 DIAGNOSIS — E538 Deficiency of other specified B group vitamins: Secondary | ICD-10-CM | POA: Diagnosis not present

## 2021-12-16 DIAGNOSIS — H6242 Otitis externa in other diseases classified elsewhere, left ear: Secondary | ICD-10-CM | POA: Diagnosis not present

## 2021-12-16 DIAGNOSIS — B369 Superficial mycosis, unspecified: Secondary | ICD-10-CM | POA: Diagnosis not present

## 2021-12-16 DIAGNOSIS — H6240 Otitis externa in other diseases classified elsewhere, unspecified ear: Secondary | ICD-10-CM | POA: Diagnosis not present

## 2021-12-16 DIAGNOSIS — H61302 Acquired stenosis of left external ear canal, unspecified: Secondary | ICD-10-CM | POA: Diagnosis not present

## 2021-12-30 DIAGNOSIS — H60332 Swimmer's ear, left ear: Secondary | ICD-10-CM | POA: Diagnosis not present

## 2022-01-08 DIAGNOSIS — E538 Deficiency of other specified B group vitamins: Secondary | ICD-10-CM | POA: Diagnosis not present

## 2022-01-09 DIAGNOSIS — H60392 Other infective otitis externa, left ear: Secondary | ICD-10-CM | POA: Diagnosis not present

## 2022-01-13 DIAGNOSIS — C61 Malignant neoplasm of prostate: Secondary | ICD-10-CM | POA: Diagnosis not present

## 2022-01-27 DIAGNOSIS — N5201 Erectile dysfunction due to arterial insufficiency: Secondary | ICD-10-CM | POA: Diagnosis not present

## 2022-01-27 DIAGNOSIS — N281 Cyst of kidney, acquired: Secondary | ICD-10-CM | POA: Diagnosis not present

## 2022-01-27 DIAGNOSIS — C61 Malignant neoplasm of prostate: Secondary | ICD-10-CM | POA: Diagnosis not present

## 2022-02-05 DIAGNOSIS — E538 Deficiency of other specified B group vitamins: Secondary | ICD-10-CM | POA: Diagnosis not present

## 2022-03-31 ENCOUNTER — Telehealth: Payer: Self-pay | Admitting: Interventional Cardiology

## 2022-03-31 DIAGNOSIS — R0609 Other forms of dyspnea: Secondary | ICD-10-CM

## 2022-03-31 DIAGNOSIS — I10 Essential (primary) hypertension: Secondary | ICD-10-CM

## 2022-03-31 NOTE — Telephone Encounter (Signed)
Returned call to patient.  No weights reported.  Patient reports he has been experiencing swelling in feet and ankles "on and off" since he last saw Dr. Tamala Julian in November 2022. Patient reports the swelling appears to occur more often since March 2023. He states he elevates his legs when sitting, wears compression socks when traveling, and attempts to stay active. Patient states he has not been as active since March due to having to help take care of his wife after her surgery.  Patient also notes, since March, he has been more short of breath "occasionally" with activity.  Patient also reports he and his wife have been eating more takeout than usual. Advised patient to cutback on the amount of takeout consumed due to increased salt in these types of food which can contribute to swelling. Also advised patient to continue to stay active, elevate feet, and wear compression socks.  Patient is concerned that swelling is occurring more often and asks if he needs to be on a fluid pill such as Lasix.  Will forward to Dr. Tamala Julian to review and advise.

## 2022-03-31 NOTE — Telephone Encounter (Signed)
Pt c/o swelling: STAT is pt has developed SOB within 24 hours  If swelling, where is the swelling located? Bilateral swelling on ankles and feet   How much weight have you gained and in what time span? Not sure,  maybe 5 pounds  Have you gained 3 pounds in a day or 5 pounds in a week? Not sure   Do you have a log of your daily weights (if so, list)? no  Are you currently taking a fluid pill? no  Are you currently SOB? Every now and then.  Have you traveled recently? He is in [place] right now.

## 2022-04-02 NOTE — Telephone Encounter (Signed)
Spoke with patient and discussed Dr. Thompson Caul recommendations: He should have a 2D Doppler echocardiogram to rule out diastolic and systolic dysfunction, and also a CBC, basic metabolic panel, and brain natruretic peptide performed.   Patient verbalized understanding and agrees with plan.  Echo ordered, scheduler to call patient to schedule appt. Lab appt scheduled for 04/08/22.

## 2022-04-08 ENCOUNTER — Other Ambulatory Visit: Payer: Medicare PPO

## 2022-04-08 DIAGNOSIS — I10 Essential (primary) hypertension: Secondary | ICD-10-CM

## 2022-04-08 DIAGNOSIS — R0609 Other forms of dyspnea: Secondary | ICD-10-CM

## 2022-04-09 ENCOUNTER — Encounter (INDEPENDENT_AMBULATORY_CARE_PROVIDER_SITE_OTHER): Payer: Medicare PPO | Admitting: Ophthalmology

## 2022-04-09 DIAGNOSIS — I10 Essential (primary) hypertension: Secondary | ICD-10-CM | POA: Diagnosis not present

## 2022-04-09 DIAGNOSIS — H348312 Tributary (branch) retinal vein occlusion, right eye, stable: Secondary | ICD-10-CM | POA: Diagnosis not present

## 2022-04-09 DIAGNOSIS — H35033 Hypertensive retinopathy, bilateral: Secondary | ICD-10-CM

## 2022-04-09 DIAGNOSIS — H353122 Nonexudative age-related macular degeneration, left eye, intermediate dry stage: Secondary | ICD-10-CM | POA: Diagnosis not present

## 2022-04-09 DIAGNOSIS — H43813 Vitreous degeneration, bilateral: Secondary | ICD-10-CM

## 2022-04-11 ENCOUNTER — Ambulatory Visit (HOSPITAL_COMMUNITY): Payer: Medicare PPO | Attending: Cardiology

## 2022-04-11 DIAGNOSIS — R0609 Other forms of dyspnea: Secondary | ICD-10-CM | POA: Insufficient documentation

## 2022-04-11 LAB — ECHOCARDIOGRAM COMPLETE
Area-P 1/2: 3.6 cm2
S' Lateral: 3.3 cm

## 2022-04-15 LAB — BASIC METABOLIC PANEL
BUN/Creatinine Ratio: 18 (ref 10–24)
BUN: 38 mg/dL — ABNORMAL HIGH (ref 8–27)
CO2: 21 mmol/L (ref 20–29)
Calcium: 9.4 mg/dL (ref 8.6–10.2)
Chloride: 105 mmol/L (ref 96–106)
Creatinine, Ser: 2.14 mg/dL — ABNORMAL HIGH (ref 0.76–1.27)
Glucose: 98 mg/dL (ref 70–99)
Potassium: 4.9 mmol/L (ref 3.5–5.2)
Sodium: 138 mmol/L (ref 134–144)
eGFR: 31 mL/min/{1.73_m2} — ABNORMAL LOW (ref >59)

## 2022-04-15 LAB — CBC
Hematocrit: 34.2 % — ABNORMAL LOW (ref 37.5–51.0)
Hemoglobin: 11.1 g/dL — ABNORMAL LOW (ref 13.0–17.7)
MCH: 33 pg (ref 26.6–33.0)
MCHC: 32.5 g/dL (ref 31.5–35.7)
MCV: 102 fL — ABNORMAL HIGH (ref 79–97)
Platelets: 150 10*3/uL (ref 150–450)
RBC: 3.36 x10E6/uL — ABNORMAL LOW (ref 4.14–5.80)
RDW: 13.9 % (ref 11.6–15.4)
WBC: 4.7 10*3/uL (ref 3.4–10.8)

## 2022-04-15 LAB — PRO B NATRIURETIC PEPTIDE: NT-Pro BNP: 174 pg/mL (ref 0–486)

## 2022-05-15 DIAGNOSIS — H6123 Impacted cerumen, bilateral: Secondary | ICD-10-CM | POA: Insufficient documentation

## 2022-05-30 ENCOUNTER — Ambulatory Visit (HOSPITAL_COMMUNITY)
Admission: RE | Admit: 2022-05-30 | Discharge: 2022-05-30 | Disposition: A | Discharge status: Home / Self Care | Payer: Medicare PPO | Source: Ambulatory Visit | Attending: Cardiology | Admitting: Cardiology

## 2022-05-30 ENCOUNTER — Ambulatory Visit (HOSPITAL_COMMUNITY)
Admission: RE | Admit: 2022-05-30 | Discharge: 2022-05-30 | Disposition: A | Discharge status: Home / Self Care | Payer: Medicare PPO | Source: Ambulatory Visit | Attending: Interventional Cardiology | Admitting: Interventional Cardiology

## 2022-05-30 DIAGNOSIS — I7143 Infrarenal abdominal aortic aneurysm, without rupture: Secondary | ICD-10-CM

## 2022-05-30 DIAGNOSIS — I714 Abdominal aortic aneurysm, without rupture, unspecified: Secondary | ICD-10-CM | POA: Diagnosis present

## 2022-05-30 DIAGNOSIS — I6523 Occlusion and stenosis of bilateral carotid arteries: Secondary | ICD-10-CM

## 2022-07-10 ENCOUNTER — Other Ambulatory Visit: Payer: Self-pay

## 2022-07-10 ENCOUNTER — Emergency Department (HOSPITAL_COMMUNITY): Payer: Medicare PPO

## 2022-07-10 ENCOUNTER — Encounter (HOSPITAL_COMMUNITY): Payer: Self-pay

## 2022-07-10 ENCOUNTER — Other Ambulatory Visit (HOSPITAL_COMMUNITY): Payer: Self-pay

## 2022-07-10 ENCOUNTER — Emergency Department (HOSPITAL_COMMUNITY)
Admission: EM | Admit: 2022-07-10 | Discharge: 2022-07-10 | Disposition: A | Discharge status: Home / Self Care | Payer: Medicare PPO | Attending: Emergency Medicine | Admitting: Emergency Medicine

## 2022-07-10 DIAGNOSIS — D696 Thrombocytopenia, unspecified: Secondary | ICD-10-CM | POA: Insufficient documentation

## 2022-07-10 DIAGNOSIS — R079 Chest pain, unspecified: Secondary | ICD-10-CM

## 2022-07-10 DIAGNOSIS — I4819 Other persistent atrial fibrillation: Secondary | ICD-10-CM | POA: Diagnosis not present

## 2022-07-10 DIAGNOSIS — Z79899 Other long term (current) drug therapy: Secondary | ICD-10-CM | POA: Diagnosis not present

## 2022-07-10 DIAGNOSIS — Z7982 Long term (current) use of aspirin: Secondary | ICD-10-CM | POA: Insufficient documentation

## 2022-07-10 DIAGNOSIS — I10 Essential (primary) hypertension: Secondary | ICD-10-CM | POA: Diagnosis not present

## 2022-07-10 DIAGNOSIS — Z7901 Long term (current) use of anticoagulants: Secondary | ICD-10-CM | POA: Diagnosis not present

## 2022-07-10 DIAGNOSIS — I2510 Atherosclerotic heart disease of native coronary artery without angina pectoris: Secondary | ICD-10-CM | POA: Insufficient documentation

## 2022-07-10 DIAGNOSIS — R0600 Dyspnea, unspecified: Secondary | ICD-10-CM | POA: Diagnosis present

## 2022-07-10 DIAGNOSIS — I4891 Unspecified atrial fibrillation: Secondary | ICD-10-CM | POA: Diagnosis not present

## 2022-07-10 LAB — COMPREHENSIVE METABOLIC PANEL
ALT: 31 U/L (ref 0–44)
AST: 25 U/L (ref 15–41)
Albumin: 3.6 g/dL (ref 3.5–5.0)
Alkaline Phosphatase: 43 U/L (ref 38–126)
Anion gap: 10 (ref 5–15)
BUN: 27 mg/dL — ABNORMAL HIGH (ref 8–23)
CO2: 21 mmol/L — ABNORMAL LOW (ref 22–32)
Calcium: 9.3 mg/dL (ref 8.9–10.3)
Chloride: 110 mmol/L (ref 98–111)
Creatinine, Ser: 1.95 mg/dL — ABNORMAL HIGH (ref 0.61–1.24)
GFR, Estimated: 35 mL/min — ABNORMAL LOW (ref >60)
Glucose, Bld: 126 mg/dL — ABNORMAL HIGH (ref 70–99)
Potassium: 3.8 mmol/L (ref 3.5–5.1)
Sodium: 141 mmol/L (ref 135–145)
Total Bilirubin: 1.3 mg/dL — ABNORMAL HIGH (ref 0.3–1.2)
Total Protein: 6.4 g/dL — ABNORMAL LOW (ref 6.5–8.1)

## 2022-07-10 LAB — CBC WITH DIFFERENTIAL/PLATELET
Abs Immature Granulocytes: 0.02 10*3/uL (ref 0.00–0.07)
Basophils Absolute: 0 10*3/uL (ref 0.0–0.1)
Basophils Relative: 0 %
Eosinophils Absolute: 0.2 10*3/uL (ref 0.0–0.5)
Eosinophils Relative: 4 %
HCT: 34.6 % — ABNORMAL LOW (ref 39.0–52.0)
Hemoglobin: 11.9 g/dL — ABNORMAL LOW (ref 13.0–17.0)
Immature Granulocytes: 0 %
Lymphocytes Relative: 22 %
Lymphs Abs: 1.2 10*3/uL (ref 0.7–4.0)
MCH: 34.3 pg — ABNORMAL HIGH (ref 26.0–34.0)
MCHC: 34.4 g/dL (ref 30.0–36.0)
MCV: 99.7 fL (ref 80.0–100.0)
Monocytes Absolute: 0.3 10*3/uL (ref 0.1–1.0)
Monocytes Relative: 6 %
Neutro Abs: 3.7 10*3/uL (ref 1.7–7.7)
Neutrophils Relative %: 68 %
Platelets: 125 10*3/uL — ABNORMAL LOW (ref 150–400)
RBC: 3.47 MIL/uL — ABNORMAL LOW (ref 4.22–5.81)
RDW: 14.2 % (ref 11.5–15.5)
WBC: 5.4 10*3/uL (ref 4.0–10.5)
nRBC: 0 % (ref 0.0–0.2)

## 2022-07-10 LAB — TSH: TSH: 1.671 u[IU]/mL (ref 0.350–4.500)

## 2022-07-10 LAB — TROPONIN I (HIGH SENSITIVITY)
Troponin I (High Sensitivity): 11 ng/L (ref <18)
Troponin I (High Sensitivity): 15 ng/L (ref <18)

## 2022-07-10 LAB — PROTIME-INR
INR: 1.1 (ref 0.8–1.2)
Prothrombin Time: 13.7 seconds (ref 11.4–15.2)

## 2022-07-10 LAB — BRAIN NATRIURETIC PEPTIDE: B Natriuretic Peptide: 54.5 pg/mL (ref 0.0–100.0)

## 2022-07-10 LAB — T4, FREE: Free T4: 0.78 ng/dL (ref 0.61–1.12)

## 2022-07-10 LAB — MAGNESIUM: Magnesium: 1.7 mg/dL (ref 1.7–2.4)

## 2022-07-10 MED ORDER — METOPROLOL TARTRATE 100 MG PO TABS: 100.0000 mg | ORAL_TABLET | Freq: Two times a day (BID) | ORAL | 2 refills | Status: DC

## 2022-07-10 MED ORDER — LACTATED RINGERS IV BOLUS
1000.0000 mL | Freq: Once | INTRAVENOUS | Status: AC
  Administered 2022-07-10: 1000 mL via INTRAVENOUS

## 2022-07-10 MED ORDER — APIXABAN 5 MG PO TABS
5.0000 mg | ORAL_TABLET | Freq: Two times a day (BID) | ORAL | Status: DC
  Administered 2022-07-10: 5 mg via ORAL
  Filled 2022-07-10: qty 1

## 2022-07-10 MED ORDER — METOPROLOL TARTRATE 5 MG/5ML IV SOLN
5.0000 mg | Freq: Once | INTRAVENOUS | Status: AC
  Administered 2022-07-10: 5 mg via INTRAVENOUS
  Filled 2022-07-10: qty 5

## 2022-07-10 MED ORDER — APIXABAN 5 MG PO TABS
5.0000 mg | ORAL_TABLET | Freq: Two times a day (BID) | ORAL | 3 refills | Status: DC
  Filled 2022-07-10: qty 60, 30d supply, fill #0

## 2022-07-10 MED ORDER — METOPROLOL TARTRATE 25 MG PO TABS
50.0000 mg | ORAL_TABLET | Freq: Once | ORAL | Status: AC
  Administered 2022-07-10: 50 mg via ORAL
  Filled 2022-07-10: qty 2

## 2022-07-10 MED ORDER — METOPROLOL TARTRATE 100 MG PO TABS
100.0000 mg | ORAL_TABLET | Freq: Two times a day (BID) | ORAL | 2 refills | Status: DC
  Filled 2022-07-10: qty 90, 45d supply, fill #0

## 2022-07-10 MED ORDER — APIXABAN 5 MG PO TABS: 5.0000 mg | ORAL_TABLET | Freq: Two times a day (BID) | ORAL | 3 refills | Status: DC

## 2022-07-10 NOTE — ED Provider Notes (Signed)
[facility] EMERGENCY DEPARTMENT Provider Note   CSN: [id number] Arrival date & time: 07/10/22  7672     History  Chief Complaint  Patient presents with   Chest Pain    YOBANY VROOM is a 76 y.o. male.  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  He was given 324 mg chewable aspirin from EMS.  He says he has no history of A-fib.  He said he woke up feeling a discomfort that was nonradiating in his left chest that felt similar to previous heart attacks which was almost 20 years ago.  He had a quadruple bypass at the time.  He has had some mild associated dyspnea on exertion and increased swelling in bilateral lower extremities.  He denies any history of heart failure.  He is currently not on any anticoagulation or Plavix.  He only takes daily baby aspirin.  He also can feel palpitations going up into his ears.  He has had no fevers recently, no vomiting, no diarrhea, no melena, no hematochezia, no history of PE or DVT, no diaphoresis.  HPI     Home Medications Prior to Admission medications   Medication Sig Start Date End Date Taking? Authorizing Provider  allopurinol (ZYLOPRIM) 100 MG tablet Take 200 mg by mouth daily.   Yes [provider]  amLODipine (NORVASC) 5 MG tablet Take 5 mg by mouth 2 (two) times daily.   Yes [provider]  aspirin EC 81 MG tablet Take 81 mg by mouth daily.   Yes [provider]  atorvastatin (LIPITOR) 40 MG tablet Take 40 mg by mouth daily.   Yes [provider]  calcium gluconate 500 MG tablet Take 1 tablet by mouth 3 (three) times daily.   Yes [provider]  Cholecalciferol (VITAMIN D) 2000 UNITS tablet Take 2,000 Units by mouth 2 (two) times daily.    Yes [provider]  Cyanocobalamin (B-12 IJ) Take one (1) injection into the muscle once a month.   Yes [provider]   doxazosin (CARDURA) 4 MG tablet Take 4 mg by mouth daily. 03/27/20  Yes [provider]  fexofenadine (ALLEGRA) 180 MG tablet Take 180 mg by mouth daily.   Yes [provider]  hydrochlorothiazide (HYDRODIURIL) 25 MG tablet Take 25 mg by mouth daily.   Yes [provider]  lisinopril (PRINIVIL,ZESTRIL) 40 MG tablet Take 40 mg by mouth daily.   Yes [provider]  Multiple Vitamin (MULTIVITAMIN) tablet Take 1 tablet by mouth daily.   Yes [provider]  Omega-3 Fatty Acids (FISH OIL) 1200 MG CAPS Take 1,200 mg by mouth 2 (two) times daily.   Yes [provider]  SILDENAFIL CITRATE PO Take 100 mg by mouth daily as needed for erectile dysfunction.   Yes [provider]  apixaban (ELIQUIS) 5 MG TABS tablet Take 1 tablet (5 mg total) by mouth 2 (two) times daily. 07/10/22   Elgie Congo, MD  metoprolol tartrate (LOPRESSOR) 100 MG tablet Take 1 tablet (100 mg total) by mouth 2 (two) times daily. 07/10/22   Elgie Congo, MD  nitroGLYCERIN (NITROSTAT) 0.4 MG SL tablet Place 0.4 mg under the tongue every 5 (five) minutes as needed for chest pain. Patient not taking: Reported on 07/10/2022    [provider]      Allergies    Colchicine, Diclofenac sodium, Gabapentin, and  Niaspan [niacin er]    Review of Systems   Review of Systems  Physical Exam Updated Vital Signs BP (!) 142/83   Pulse (!) 120   Temp 98.2 F (36.8 C) (Oral)   Resp 18   Ht '5\' 11"'$  (1.803 m)   Wt 99.8 kg   SpO2 98%   BMI 30.68 kg/m  Physical Exam Constitutional: Alert and oriented. Well appearing and in no distress. Eyes: Conjunctivae are normal. ENT      Head: Normocephalic and atraumatic.      Nose: No congestion.      Mouth/Throat: Mucous membranes are moist.      Neck: No stridor. Cardiovascular: S1, S2, irregularly irregular rhythm, tachycardic, normal and symmetric distal pulses are present in all extremities.Warm and well  perfused. Respiratory: Normal respiratory effort. Breath sounds are normal.  O2 sat 99 on RA Gastrointestinal: Soft and nontender. There is no CVA tenderness. Musculoskeletal: Normal range of motion in all extremities. 2+ equal nontender pitting edema extending from the foot to the knee bilaterally Neurologic: Normal speech and language.  Moving all extremities equally.  Sensation grossly intact.  No facial droop.  No gross focal neurologic deficits are appreciated. Skin: Skin is warm, dry and intact. No rash noted. Psychiatric: Mood and affect are normal. Speech and behavior are normal.  ED Results / Procedures / Treatments   Labs (all labs ordered are listed, but only abnormal results are displayed) Labs Reviewed  COMPREHENSIVE METABOLIC PANEL - Abnormal; Notable for the following components:      Result Value   CO2 21 (*)    Glucose, Bld 126 (*)    BUN 27 (*)    Creatinine, Ser 1.95 (*)    Total Protein 6.4 (*)    Total Bilirubin 1.3 (*)    GFR, Estimated 35 (*)    All other components within normal limits  CBC WITH DIFFERENTIAL/PLATELET - Abnormal; Notable for the following components:   RBC 3.47 (*)    Hemoglobin 11.9 (*)    HCT 34.6 (*)    MCH 34.3 (*)    Platelets 125 (*)    All other components within normal limits  PROTIME-INR  TSH  T4, FREE  MAGNESIUM  BRAIN NATRIURETIC PEPTIDE  TROPONIN I (HIGH SENSITIVITY)  TROPONIN I (HIGH SENSITIVITY)    EKG EKG Interpretation  Date/Time:  Thursday July 10 2022 08:44:29 EDT Ventricular Rate:  130 PR Interval:    QRS Duration: 100 QT Interval:  289 QTC Calculation: 425 R Axis:   103 Text Interpretation: Atrial fibrillation Anterior infarct, old Borderline ST depression, diffuse leads Abnormal T, consider ischemia, diffuse leads ST depression lateral leads V5 and V6 with T wave inversions inferior leads Confirmed by Georgina Snell [phone]) on 07/10/2022 8:51:32 AM  Radiology DG Chest Portable 1 View  Result Date:  07/10/2022 CLINICAL DATA:  a fib rvr EXAM: PORTABLE CHEST 1 VIEW COMPARISON:  Chest x-ray 06/02/2008. FINDINGS: The heart size and mediastinal contours are within normal limits. Median sternotomy. Both lungs are clear. No visible pleural effusions or pneumothorax. No acute osseous abnormality. Partially imaged ACDF. IMPRESSION: No active disease. Electronically Signed   By: Margaretha Sheffield M.D.   On: 07/10/2022 09:20    Procedures Procedures  Remain on constant cardiac monitoring which I reviewed, A-fib RVR.  Medications Ordered in ED Medications  apixaban (ELIQUIS) tablet 5 mg (5 mg Oral Given 07/10/22 1539)  metoprolol tartrate (LOPRESSOR) injection 5 mg (5 mg Intravenous Given 07/10/22 0918)  metoprolol tartrate (LOPRESSOR)  tablet 50 mg (50 mg Oral Given 07/10/22 0917)  metoprolol tartrate (LOPRESSOR) injection 5 mg (5 mg Intravenous Given 07/10/22 1000)  lactated ringers bolus 1,000 mL (0 mLs Intravenous Stopped 07/10/22 1221)  metoprolol tartrate (LOPRESSOR) tablet 50 mg (50 mg Oral Given 07/10/22 1539)    ED Course/ Medical Decision Making/ A&P Clinical Course as of 07/10/22 1559  Thu Jul 10, 2022  1010 After receiving 2 doses IV metoprolol 5 mg and p.o. metoprolol 50 mg he is in A-fib rates 90s to low 100s.  I personally reviewed patient's chest x-ray, no consolidation concerning for pneumonia, no pneumothorax, no pleural effusion, no pulmonary edema.  Agree with radiology negative interpretation. [VB]  1046 Labs reviewed.  Hemoglobin 11.9 at baseline.  He does have elevated creatinine 1.95 with elevated BUN 27 which appears to be consistent with recent labs from about 3 months prior.  He is now rate controlled with somewhat softer blood pressures, ordered for IV fluids.  He does feel chest discomfort but much improved from prior which makes me suspect likely influence from A-fib RVR.  Repeat EKG obtained with improvement of ST depressions in the lateral leads as well as improvement of  the T wave inversions in the inferior leads.  Since patient new A-fib and endorsing atypical chest pain with significant cardiac history, reached out to cardiology for evaluation. [VB]  1213 Repeat troponin mild uptrend to 15 from 11 likely from demand ischemia from A-fib RVR event.  Still pending cardiology recommendations. [VB]  1509 Spoke with on-call cardiologist Dr Audie Box who has seen and evaluate the patient.  His plan is to discharge patient home on increased dose of metoprolol 100 mg twice daily and start him on Eliquis 5 mg twice daily.  They will arrange for his follow-up in A-fib clinic on Monday. [VB]    Clinical Course User Index [VB] Elgie Congo, MD                           Medical Decision Making ZAEEM KANDEL is a 76 y.o. male.  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  He was given 324 mg chewable aspirin from EMS.   Patient's initial EKG obtained A-fib RVR rate 130 with new ST depressions in the lateral leads as well as T wave inversions in the inferior leads.  With his underlying history of CAD and ischemic heart disease suspect contribution from underlying ischemic heart disease and possible NSTEMI.  He was given 324 mg of chewable aspirin from EMS.  Patient presents stable with blood pressure 130/78 overall well-appearing with no hypoxia or increased work of breathing and A-fib RVR.  Since patient is currently on beta-blockers, ordered for p.o. beta-blocker metoprolol 50 mg and IV metoprolol 5 mg to start.  We will further evaluate for NSTEMI with troponins, infection or evidence of fluid overload with chest x-ray, acute electrolyte abnormalities with labs as well as anemia with labs and thyroid disorder with the labs to further evaluate for underlying etiology of new A-fib RVR.  No concern for dissection with well appearance and no pulse or neurologic deficits.  No concern for  AAA rupture with nontoxic appearance, no hypotension or instability and no pulse or neurologic deficits with overall well appearance.  Suspect patient will require admission with new A-fib RVR as well as associated concerning chest pain consider possible  ACS etiology.  Amount and/or Complexity of Data Reviewed Labs: ordered. Radiology: ordered.  Risk Prescription drug management.    Final Clinical Impression(s) / ED Diagnoses Final diagnoses:  Atrial fibrillation with RVR (HCC)  Chest pain, unspecified type  Thrombocytopenia ([place])    Rx / DC Orders ED Discharge Orders          Ordered    metoprolol tartrate (LOPRESSOR) 100 MG tablet  2 times daily,   Status:  Discontinued        07/10/22 1434    apixaban (ELIQUIS) 5 MG TABS tablet  2 times daily,   Status:  Discontinued        07/10/22 1434    apixaban (ELIQUIS) 5 MG TABS tablet  2 times daily        07/10/22 1513    metoprolol tartrate (LOPRESSOR) 100 MG tablet  2 times daily        07/10/22 1513              Elgie Congo, MD 07/10/22 1559

## 2022-07-10 NOTE — ED Notes (Signed)
All discharge instructions including follow up care and prescriptions reviewed with patient and patient's family member at bedside. Patient stable and ambulatory at time of discharge.

## 2022-07-10 NOTE — Discharge Instructions (Addendum)
You were seen in the emergency department today after a first-time episode of A fib.  You were seen evaluate by both the emergency department team and the cardiology team.  Overall, it was determined after you are rate controlled with IV medications that you are safe to go home.  Your cardiac labs and enzymes were reassuring.  We think it is unlikely you are having a heart attack.  Please take the blood thinners as prescribed as well as the increased dose of metoprolol 100 mg 2 times daily.  Follow-up with the A-fib clinic on 07/17/2022 as planned.  Come back for any severe chest pain, return of fast rates persistently greater than 120, severe shortness of breath, fainting, bleeding especially bloody bowel movements or dark black stools, or any other symptoms concerning to you.

## 2022-07-10 NOTE — Progress Notes (Signed)
ANTICOAGULATION CONSULT NOTE - Initial Consult  Pharmacy Consult for Apixaban Indication: atrial fibrillation  Allergies  Allergen Reactions   Colchicine Other (See Comments)   Diclofenac Sodium     Chest Pain   Gabapentin     Increased sedation    Niaspan [Niacin Er]     Involuntary facial flushing    Patient Measurements: Height: '5\' 11"'$  (180.3 cm) Weight: 99.8 kg (220 lb) IBW/kg (Calculated) : 75.3  Vital Signs: Temp: 98.2 F (36.8 C) (10/12 1423) Temp Source: Oral (10/12 1423) BP: 132/65 (10/12 1400) Pulse Rate: 75 (10/12 1400)  Labs: Recent Labs    07/10/22 0907 07/10/22 1117  HGB 11.9*  --   HCT 34.6*  --   PLT 125*  --   LABPROT 13.7  --   INR 1.1  --   CREATININE 1.95*  --   TROPONINIHS 11 15    Estimated Creatinine Clearance: 38.2 mL/min (A) (by C-G formula based on SCr of 1.95 mg/dL (H)).   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 Pharmacy has been consulted for Eliquis dosing for afib.  Goal of Therapy:  Prevention of VTE in the setting of atrial fibrillation Monitor platelets by anticoagulation protocol: Yes   Plan:  Initiate Eliquis '5mg'$  BID Monitor for s/s of bleeding and clotting  Titus Dubin, PharmD PGY1 Pharmacy Resident 07/10/2022 3:08 PM

## 2022-07-10 NOTE — Consult Note (Addendum)
Cardiology Consult   Patient ID: Erik Perez MRN: [id number]; DOB: 11-18-1945   Admission date: 07/10/2022  PCP:  Aretta Nip, MD   [facility] Cardiologist:  Sinclair Grooms, MD     Chief Complaint:  Chest Pain, Palpitations   Patient Profile:   Erik Perez is a 76 y.o. male with a past medical history of CAD s/p bypass grafting in 2000 (LIMA to LAD, RIMA to RCA), HTN, HLD, OSA, carotid artery disease who is being seen 07/10/2022 for the evaluation of new onset atrial fibrillation.  History of Present Illness:   [name] is a 76 year old male with above medical history who is followed by Dr. Tamala Julian. Per chart review, patient has a history of CAD s/p prior bypass grafting in 2000 with LIMA-LAD, RIMA-RCA. Later had a Taxus stent placed to the native circumflex in 2002. Patient has been followed by Dr. Tamala Julian since that time. Most recent echocardiogram from 05/30/22 showed EF 60-65%, no regional wall motion abnormalities, normal LV diastolic parameters, normal RV systolic function.   He has a known history of carotid artery disease that is followed with yearly dopplers. Most recent carotid ultrasounds were completed on 05/30/22 and showed 1-39% stenosis in the right ICA, 1-39% stenosis in the left ICA. Recommended to repeat study in 1 year.   Patient also has a known history of AAA. Most recent AAA ultrasound was completed on 05/30/22 and showed abnormal dilation of the right common iliac artery. The largest aortic measurement was 3.4 cm, which was unchanged compared to prior exam. Recommended repeat study in 1 year.   Patient was last seen by cardiology on 08/08/2021. At that time, patient was doing well and denied angina, palpitations, dyspnea, orthopnea.   Patient presented to the ED on 10/12 complaining of chest pain and palpitations that began early this AM. EKG in the ED showed atrial fibrillation, HR 130 BPM, inverted T waves in leads  II, III, aVF, borderline ST depression in V4-V6. Labs in the ED showed BNP within normal limits at 54.5. hsTn 11>>15. Na 141, K 3.8, creatinine 1.95,   On interview, patient reports that he was in his usual state of health until he woke up this morning.  He reported waking up from his sleep with chest pain.  Chest pain felt like pressure, located on the left side of his chest, did not radiate.  At its worst, pain was ranked a 7 out of 10 on the pain scale.  He also noticed a feeling of pounding in his neck.  Denies having any shortness of breath, dizziness, headache, lightheadedness, syncope/near syncope.  Patient denies cough, nasal congestion, fever, chills, body aches.  He denies having any chest pain or shortness of breath on exertion recently.  He is able to go up and down stairs, work around his home without having chest pain or shortness of breath.  He does have a family history of coronary artery disease, father had an MI in his son has had multiple stents placed to his LAD.  He is reports a distant history of tobacco use, however he quit smoking over 20 years ago.  Patient reports good compliance with his home medications.  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 Taxus stent 2002     Medications Prior to Admission: Prior to Admission medications   Medication Sig Start Date End Date Taking? Authorizing Provider  allopurinol (ZYLOPRIM) 100 MG tablet Take 200 mg by mouth daily.   Yes [provider]  amLODipine (NORVASC) 5 MG tablet Take 5 mg by mouth 2 (two) times daily.   Yes  [provider]  aspirin EC 81 MG tablet Take 81 mg by mouth daily.   Yes [provider]  atorvastatin (LIPITOR) 40 MG tablet Take 40 mg by mouth daily.   Yes [provider]  calcium gluconate 500 MG tablet Take 1 tablet by mouth 3 (three) times daily.   Yes [provider]  Cholecalciferol (VITAMIN D) 2000 UNITS tablet Take 2,000 Units by mouth 2 (two) times daily.    Yes [provider]  Cyanocobalamin (B-12 IJ) Take one (1) injection into the muscle once a month.   Yes [provider]  doxazosin (CARDURA) 4 MG tablet Take 4 mg by mouth daily. 03/27/20  Yes [provider]  fexofenadine (ALLEGRA) 180 MG tablet Take 180 mg by mouth daily.   Yes [provider]  hydrochlorothiazide (HYDRODIURIL) 25 MG tablet Take 25 mg by mouth daily.   Yes [provider]  lisinopril (PRINIVIL,ZESTRIL) 40 MG tablet Take 40 mg by mouth daily.   Yes [provider]  metoprolol (LOPRESSOR) 50 MG tablet Take 1.5 tablets (75 mg total) by mouth each morning. Take 1 tablet (50 mg total) by mouth each evening.   Yes [provider]  Multiple Vitamin (MULTIVITAMIN) tablet Take 1 tablet by mouth daily.   Yes [provider]  Omega-3 Fatty Acids (FISH OIL) 1200 MG CAPS Take 1,200 mg by mouth 2 (two) times daily.   Yes [provider]  SILDENAFIL CITRATE PO Take 100 mg by mouth daily as needed for erectile dysfunction.   Yes [provider]  nitroGLYCERIN (NITROSTAT) 0.4 MG SL tablet Place 0.4 mg under the tongue every 5 (five) minutes as needed for chest pain. Patient not taking: Reported on 07/10/2022    [provider]     Allergies:    Allergies  Allergen Reactions   Colchicine Other (See Comments)   Diclofenac Sodium     Chest Pain   Gabapentin     Increased sedation    Niaspan [Niacin Er]     Involuntary facial flushing    Social History:   Social History   Socioeconomic  History   Marital status: Married    Spouse name: Not on file   Number of children: Not on file   Years of education: Not on file   Highest education level: Not on file  Occupational History   Not on file  Tobacco Use   Smoking status: Former    Packs/day: 0.33    Years: 23.00    Total pack years: 7.59    Types: Cigarettes    Start date: 2    Quit date: 1985    Years since quitting: 38.8   Smokeless tobacco: Never  Substance and Sexual Activity   Alcohol use: Yes    Comment: social   Drug use: No   Sexual activity: Not on  file  Other Topics Concern   Not on file  Social History Narrative   Not on file   Social Determinants of Health   Financial Resource Strain: Not on file  Food Insecurity: Not on file  Transportation Needs: Not on file  Physical Activity: Not on file  Stress: Not on file  Social Connections: Not on file  Intimate Partner Violence: Not on file    Family History:   The patient's family history includes Heart disease in his brother, father, and mother.    ROS:  Please see the history of present illness.  All other ROS reviewed and negative.     Physical Exam/Data:   Vitals:   07/10/22 1245 07/10/22 1300 07/10/22 1345 07/10/22 1400  BP: (!) 133/105 (!) 133/95 (!) 121/58 132/65  Pulse: 99 (!) 132 (!) 114 75  Resp:  '20 17 12  '$ Temp:      TempSrc:      SpO2: 98% 95% 96% 97%  Weight:      Height:       No intake or output data in the 24 hours ending 07/10/22 1407    07/10/2022    8:59 AM 08/08/2021    3:41 PM 05/07/2020    8:51 AM  Last 3 Weights  Weight (lbs) 220 lb 226 lb 9.6 oz 219 lb 6.4 oz  Weight (kg) 99.791 kg 102.785 kg 99.519 kg     Body mass index is 30.68 kg/m.  General:  Well nourished, well developed, in no acute distress. Resting comfortably in the bed  HEENT: normal Neck: no JVD Vascular: No carotid bruits; Radial and dorsalis pedis pulses 2+ bilaterally   Cardiac:  normal S1, S2. Irregular rate and rhythm  Lungs:   clear to auscultation bilaterally, no wheezing, rhonchi or rales. Normal WOB on room air  Abd: soft, nontender, no hepatomegaly  Ext: 3+ pitting edema in BLE  Musculoskeletal:  No deformities, BUE and BLE strength normal and equal Skin: warm and dry  Neuro:  CNs 2-12 intact, no focal abnormalities noted Psych:  Normal affect    EKG:  The ECG that was done  was personally reviewed and demonstrates atrial fibrillation, HR 130 BPM, inverted T waves in leads II, III, aVF, borderline ST depression in V4-V6.  Relevant CV Studies:   Laboratory Data:  High Sensitivity Troponin:   Recent Labs  Lab 07/10/22 0907 07/10/22 1117  TROPONINIHS 11 15      Chemistry Recent Labs  Lab 07/10/22 0907  NA 141  K 3.8  CL 110  CO2 21*  GLUCOSE 126*  BUN 27*  CREATININE 1.95*  CALCIUM 9.3  MG 1.7  GFRNONAA 35*  ANIONGAP 10    Recent Labs  Lab 07/10/22 0907  PROT 6.4*  ALBUMIN 3.6  AST 25  ALT 31  ALKPHOS 43  BILITOT 1.3*   Lipids No results for input(s): "CHOL", "TRIG", "HDL", "LABVLDL", "LDLCALC", "CHOLHDL" in the last 168 hours. Hematology Recent Labs  Lab 07/10/22 0907  WBC 5.4  RBC 3.47*  HGB 11.9*  HCT 34.6*  MCV 99.7  MCH 34.3*  MCHC 34.4  RDW 14.2  PLT 125*   Thyroid  Recent Labs  Lab 07/10/22 0907  TSH 1.671  FREET4 0.78   BNP Recent Labs  Lab 07/10/22 0904  BNP 54.5    DDimer No results for input(s): "DDIMER" in the last 168 hours.   Radiology/Studies:  DG Chest Portable 1 View  Result Date: 07/10/2022 CLINICAL DATA:  a fib  rvr EXAM: PORTABLE CHEST 1 VIEW COMPARISON:  Chest x-ray 06/02/2008. FINDINGS: The heart size and mediastinal contours are within normal limits. Median sternotomy. Both lungs are clear. No visible pleural effusions or pneumothorax. No acute osseous abnormality. Partially imaged ACDF. IMPRESSION: No active disease. Electronically Signed   By: Margaretha Sheffield M.D.   On: 07/10/2022 09:20     Assessment and Plan:   New onset  atrial fibrillation  - Patient presented complaining of chest pressure, palpitations he felt in his neck. Denies sob, sweating, dizziness, syncope/near syncope. Since HR has improved, chest pain has improved. Currently chest pain free  - EKG showed atrial fibrillation, HR 130 BPM, inverted T waves in leads II, III, aVF, borderline ST depression in V4-V6. - TSH within normal limits  - Patient does take metoprolol at home-- was given home metoprolol tartrate 50 mg and has been given IV lopressor 5 mg x2 - Per telemetry, HR overall improved. Is in the 80s-90s when at rest. Increased to 110s when walking in halls. Patient is asymptomatic, BP stable  - Increase lopressor to 100 mg BID  - Start eliquis 5 mg BID for CHADS-VASc 4. Patient denies any history of bleeding issues. Encouraged patient to monitor for signs of bleeding such as blood in urine, blood in stool/dark stool.  - Can consider outpatient cardioversion after 3 weeks of uninterrupted anticoagulation  - I have arranged an outpatient appointment with the afib clinic for 10/19  CAD s/p bypass 2000 - hsTn negative x2 in the ED  - Patient initially complained of chest pain. Pain improved when HR improved and he has been chest pain free since. Patient denies having chest pain or dyspnea on exertion recently - Increase lopressor as above  - Continue lipitor 40 mg daily  - Continue asa  Carotid Artery Disease  AAA - Continue to follow as an outpatient  - Plan to repeat carotid artery ultrasound and AAA ultrasound in 1 year   HTN  - Increase lopressor - Continue amlodipine 5 mg daily, hctz 25 mg daily, lisinopril 40 mg daily   Risk Assessment/Risk Scores:      CHA2DS2-VASc Score = 4  This indicates a 4.8% annual risk of stroke. The patient's score is based upon: CHF History: 0 HTN History: 1 Diabetes History: 0 Stroke History: 0 Vascular Disease History: 1 Age Score: 2 Gender Score: 0   For questions or updates, please  contact [facility] Please consult [email or web address] for contact info under     Signed, Margie Billet, PA-C  07/10/2022 2:07 PM

## 2022-07-11 ENCOUNTER — Telehealth: Payer: Self-pay | Admitting: Interventional Cardiology

## 2022-07-11 ENCOUNTER — Encounter: Payer: Self-pay | Admitting: Interventional Cardiology

## 2022-07-11 NOTE — Telephone Encounter (Signed)
Returned call to patient.  He was seen in ED yesterday for new onset A-fib. He was started on Eliquis '5mg'$  BID. He asked if he should continue taking the ASA '81mg'$  QD with the Eliquis.  Informed patient that ASA is not usually taken while on Eliquis therapy due to increased risk of bleeding. Patient states he will stop ASA '81mg'$  and continue with Eliquis.  Patient has follow-up appointment with A-fib clinic on 07/17/22.  Will forward to Dr. Tamala Julian to review.

## 2022-07-11 NOTE — Telephone Encounter (Signed)
Pt c/o medication issue:  1. Name of Medication: aspirin EC 81 MG tablet  2. How are you currently taking this medication (dosage and times per day)? Take 81 mg by mouth daily.  3. Are you having a reaction (difficulty breathing--STAT)? no  4. What is your medication issue? Patient calling needing clarity on medication. When he was in the hospital they added a medication. Patient wanted to make sure he can take that along with aspirin. Please advise

## 2022-07-17 ENCOUNTER — Encounter (HOSPITAL_COMMUNITY): Payer: Self-pay | Admitting: Physician Assistant

## 2022-07-17 ENCOUNTER — Ambulatory Visit (HOSPITAL_COMMUNITY)
Admit: 2022-07-17 | Discharge: 2022-07-17 | Disposition: A | Discharge status: Home / Self Care | Payer: Medicare PPO | Attending: Physician Assistant | Admitting: Physician Assistant

## 2022-07-17 ENCOUNTER — Encounter: Payer: Self-pay | Admitting: Interventional Cardiology

## 2022-07-17 VITALS — BP 136/62 | HR 54 | Ht 71.0 in | Wt 231.0 lb

## 2022-07-17 DIAGNOSIS — I10 Essential (primary) hypertension: Secondary | ICD-10-CM | POA: Diagnosis not present

## 2022-07-17 DIAGNOSIS — I2510 Atherosclerotic heart disease of native coronary artery without angina pectoris: Secondary | ICD-10-CM | POA: Diagnosis not present

## 2022-07-17 DIAGNOSIS — I480 Paroxysmal atrial fibrillation: Secondary | ICD-10-CM | POA: Diagnosis present

## 2022-07-17 DIAGNOSIS — Z8546 Personal history of malignant neoplasm of prostate: Secondary | ICD-10-CM | POA: Diagnosis not present

## 2022-07-17 DIAGNOSIS — Z6832 Body mass index (BMI) 32.0-32.9, adult: Secondary | ICD-10-CM | POA: Insufficient documentation

## 2022-07-17 DIAGNOSIS — D6869 Other thrombophilia: Secondary | ICD-10-CM | POA: Diagnosis not present

## 2022-07-17 DIAGNOSIS — G4733 Obstructive sleep apnea (adult) (pediatric): Secondary | ICD-10-CM | POA: Diagnosis not present

## 2022-07-17 DIAGNOSIS — I4819 Other persistent atrial fibrillation: Secondary | ICD-10-CM | POA: Insufficient documentation

## 2022-07-17 DIAGNOSIS — E669 Obesity, unspecified: Secondary | ICD-10-CM | POA: Diagnosis not present

## 2022-07-17 MED ORDER — APIXABAN 5 MG PO TABS: 5.0000 mg | ORAL_TABLET | Freq: Two times a day (BID) | ORAL | 3 refills | Status: DC

## 2022-07-17 MED ORDER — METOPROLOL TARTRATE 100 MG PO TABS: 100.0000 mg | ORAL_TABLET | Freq: Two times a day (BID) | ORAL | 2 refills | Status: DC

## 2022-07-17 NOTE — Progress Notes (Signed)
Primary Care Physician: Lurline Del, DO Primary Cardiologist: Dr Tamala Julian Primary Electrophysiologist: none Referring Physician: [facility]   Erik Perez is a 76 y.o. male with a history of CAD s/p CABG, HTN, OSA, prostate CA, HLD, carotid artery disease, atrial fibrillation who presents for consultation in the [facility].  The patient was initially diagnosed with atrial fibrillation 07/10/22 after presenting to the ED with symptoms of tachypalpitations and chest pain. ECG showed new onset afib. His home metoprolol was increased for rate control and he was started on Eliquis for a CHADS2VASC score of 4. Patient reports compliance with his CPAP and denies significant alcohol use. There were no specific triggers for his afib that he could identify. He is back in SR today.   Today, he denies symptoms of palpitations, chest pain, shortness of breath, orthopnea, PND, lower extremity edema, dizziness, presyncope, syncope, bleeding, or neurologic sequela. The patient is tolerating medications without difficulties and is otherwise without complaint today.    Atrial Fibrillation Risk Factors:  he does have symptoms or diagnosis of sleep apnea. he is compliant with CPAP therapy. he does not have a history of rheumatic fever. he does not have a history of alcohol use.   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 Taxus stent 2002    Current Outpatient Medications  Medication Sig Dispense Refill   allopurinol (ZYLOPRIM) 100 MG tablet Take 200 mg by mouth daily.     amLODipine (NORVASC) 5 MG tablet Take 5 mg by mouth 2 (two) times daily.     apixaban (ELIQUIS) 5 MG TABS tablet Take 1 tablet (5 mg total) by mouth 2 (two) times daily. 60 tablet 3   atorvastatin (LIPITOR) 40 MG tablet Take 40 mg by mouth daily.     calcium gluconate 500 MG tablet Take 1 tablet by mouth 3 (three) times daily.     Cholecalciferol (VITAMIN D) 2000 UNITS tablet Take 2,000 Units by mouth 2 (two) times daily.      Cyanocobalamin (B-12 IJ) Take one (1) injection into the muscle once a month.     doxazosin (CARDURA) 4 MG tablet Take 4 mg by mouth daily.     fexofenadine (ALLEGRA) 180 MG tablet Take 180 mg by mouth daily.     hydrochlorothiazide (HYDRODIURIL) 25 MG tablet Take 25 mg by mouth daily.     lisinopril (PRINIVIL,ZESTRIL) 40 MG tablet Take 40 mg by mouth daily.     metoprolol tartrate (LOPRESSOR) 100 MG tablet Take 1 tablet (100 mg total) by mouth 2 (  two) times daily. 90 tablet 2   Multiple Vitamin (MULTIVITAMIN) tablet Take 1 tablet by mouth daily.     nitroGLYCERIN (NITROSTAT) 0.4 MG SL tablet Place 0.4 mg under the tongue every 5 (five) minutes as needed for chest pain.     Omega-3 Fatty Acids (FISH OIL) 1200 MG CAPS Take 1,200 mg by mouth 2 (two) times daily.     SILDENAFIL CITRATE PO Take 100 mg by mouth daily as needed for erectile dysfunction.     No current facility-administered medications for this encounter.    Allergies  Allergen Reactions   Colchicine Other (See Comments)   Diclofenac Sodium     Chest Pain   Gabapentin      Increased sedation    Niaspan [Niacin Er]     Involuntary facial flushing    Social History   Socioeconomic History   Marital status: Married    Spouse name: Not on file   Number of children: Not on file   Years of education: Not on file   Highest education level: Not on file  Occupational History   Not on file  Tobacco Use   Smoking status: Former    Packs/day: 0.33    Years: 23.00    Total pack years: 7.59    Types: Cigarettes    Start date: 34    Quit date: 1985    Years since quitting: 38.8   Smokeless tobacco: Never   Tobacco comments:    Former smoker 07/17/22  Substance and Sexual Activity   Alcohol use: Yes    Comment: social   Drug use: No   Sexual activity: Not on file  Other Topics Concern   Not on file  Social History Narrative   Not on file   Social Determinants of Health   Financial Resource Strain: Not on file  Food Insecurity: Not on file  Transportation Needs: Not on file  Physical Activity: Not on file  Stress: Not on file  Social Connections: Not on file  Intimate Partner Violence: Not on file     ROS- All systems are reviewed and negative except as per the HPI above.  Physical Exam: Vitals:   07/17/22 1447  BP: 136/62  Pulse: (!) 54  Weight: 104.8 kg  Height: '5\' 11"'$  (1.803 m)    GEN- The patient is a well appearing elderly male, alert and oriented x 3 today.   Head- normocephalic, atraumatic Eyes-  Sclera clear, conjunctiva pink Ears- hearing intact Oropharynx- clear Neck- supple  Lungs- Clear to ausculation bilaterally, normal work of breathing Heart- Regular rate and rhythm, no murmurs, rubs or gallops  GI- soft, NT, ND, + BS Extremities- no clubbing, cyanosis, or edema MS- no significant deformity or atrophy Skin- no rash or lesion Psych- euthymic mood, full affect Neuro- strength and sensation are intact  Wt Readings from Last 3 Encounters:  07/17/22 104.8 kg  07/10/22 99.8 kg  08/08/21 102.8 kg    EKG today  demonstrates  SB Vent. rate 54 BPM PR interval 116 ms QRS duration 92 ms QT/QTcB 412/390 ms  Echo 04/11/22 demonstrated   1. Left ventricular ejection fraction, by estimation, is 60 to 65%. Left  ventricular ejection fraction by 3D volume is 60 %. The left ventricle has normal function. The left ventricle has no regional wall motion  abnormalities. Left ventricular diastolic parameters were normal.   2. Right ventricular systolic function is normal. The right ventricular  size is normal. Tricuspid regurgitation signal is inadequate for  assessing PA pressure.   3. The mitral valve is normal in structure. No evidence of mitral valve  regurgitation. No evidence of mitral stenosis.   4. The aortic valve is tricuspid. Aortic valve regurgitation is not  visualized. Aortic valve sclerosis/calcification is present, without any  evidence of aortic stenosis.   5. The inferior vena cava is normal in size with greater than 50%  respiratory variability, suggesting right atrial pressure of 3 mmHg.   Epic records are reviewed at length today  CHA2DS2-VASc Score = 4  The patient's score is based upon: CHF History: 0 HTN History: 1 Diabetes History: 0 Stroke History: 0 Vascular Disease History: 1 Age Score: 2 Gender Score: 0       ASSESSMENT AND PLAN: 1. Paroxysmal Atrial Fibrillation (ICD10:  I48.0) The patient's CHA2DS2-VASc score is 4, indicating a 4.8% annual risk of stroke.   General education about afib provided and questions answered. We also discussed his stroke risk and the risks and benefits of anticoagulation. Patient spontaneously converted to SR and will not need DCCV.  Continue Eliquis 5 mg BID Continue Lopressor 100 mg BID. He has been more fatigued since increasing dose, we discussed decreasing to 75 mg BID, he would like to continue his present therapy for now.  We discussed smart device technology for home monitoring.   2. Secondary Hypercoagulable State (ICD10:   D68.69) The patient is at significant risk for stroke/thromboembolism based upon his CHA2DS2-VASc Score of 4.  Continue Apixaban (Eliquis).   3. Obesity Body mass index is 32.22 kg/m. Lifestyle modification was discussed at length including regular exercise and weight reduction.  4. Obstructive sleep apnea The importance of adequate treatment of sleep apnea was discussed today in order to improve our ability to maintain sinus rhythm long term. Patient reports compliance with CPAP therapy.  5. CAD S/p CABG No anginal symptoms.  6. HTN Stable, no changes today.   Follow up with Dr Tamala Julian per recall.    [facility] [address] [place], [place] [address] [phone] 07/17/2022 3:08 PM

## 2022-07-18 ENCOUNTER — Telehealth: Payer: Self-pay | Admitting: Interventional Cardiology

## 2022-07-18 MED ORDER — NITROGLYCERIN 0.4 MG SL SUBL: 0.4000 mg | SUBLINGUAL_TABLET | SUBLINGUAL | 1 refills | Status: AC | PRN

## 2022-07-18 NOTE — Telephone Encounter (Signed)
Pt c/o medication issue:  1. Name of Medication: metoprolol tartrate (LOPRESSOR) 100 MG tablet  2. How are you currently taking this medication (dosage and times per day)? Medication was doubled - taking as directed  3. Are you having a reaction (difficulty breathing--STAT)? BP is rising  4. What is your medication issue? Patient's metoprolol was doubled and now he has noticed his BP is increasing to the 130's and 140's  He is going to be sending an Nurse, mental health via [facility] with all of his BP and HR readings sometime today - just FYI.

## 2022-07-21 ENCOUNTER — Encounter: Payer: Self-pay | Admitting: Interventional Cardiology

## 2022-07-23 ENCOUNTER — Encounter: Payer: Self-pay | Admitting: Interventional Cardiology

## 2022-08-02 ENCOUNTER — Encounter: Payer: Self-pay | Admitting: Interventional Cardiology

## 2022-08-12 NOTE — Progress Notes (Signed)
Office Visit    Patient Name: Erik Perez Date of Encounter: 08/14/2022  Primary Care Provider:  Lurline Del, DO Primary Cardiologist:  Sinclair Grooms, MD Primary Electrophysiologist: None  Chief Complaint    Erik Perez is a 76 y.o. male with PMH of new onset atrial fibrillation (on Eliquis) CAD s/p CABG (LIMA LAD; RIMA RCA, 2009) and Taxus stent in the native circumflex, hypertension, OSA on CPAP, prostate CA, AAA, hyperlipidemia, and bilateral carotid disease who presents today for follow-up of atrial fibrillation.  Past Medical History    Past Medical History:  Diagnosis Date   Carotid artery obstruction    Bilateral moderate carotid obstruction 2013 doppler   Coronary artery disease    Hyperlipidemia    Hypertension    OSA (obstructive sleep apnea)    Split 02-08-07 AHI total 32/hr; REM 60/hr O2 sat min NREM 88% REM 88%   Past Surgical History:  Procedure Laterality Date   CERVICAL DISC SURGERY  2009   CORONARY ARTERY BYPASS GRAFT     [place], to LAD, RIMA to RCA 2000,Cfx. Taxus stent 2002    Allergies  Allergies  Allergen Reactions   Colchicine Other (See Comments)   Diclofenac Sodium     Chest Pain   Gabapentin     Increased sedation    Niaspan [Niacin Er]     Involuntary facial flushing    History of Present Illness    Erik Perez  is a 76 year old male with the above mention past medical history who presents today for follow-up of atrial fibrillation.  Erik Perez has been followed by Dr. Tamala Julian since 2015 for management of coronary artery disease.  He has a history of CABG in 2000 and Taxus stent placed to the circumflex in 2009.  Most recent 2D echo was completed 03/2022 showing EF of 60-65%, no RWMA, normal RV systolic function with no evidence of MV or AV regurgitation and no LVH.  Bilateral carotid Dopplers completed showing right ICA 40-59% and left ICA with 1-39%.  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  He was last seen by Dr. Tamala Julian on 07/2021 and was doing well with no cardiac complaints.  Erik Perez was admitted on 07/10/2022 with new onset atrial fibrillation.  High-sensitivity troponins were negative x2 patient's creatinine was 1.95 with normal thyroid studies.  He was started on Eliquis 5 mg twice daily with plan to undergo DCCV in 3 weeks.  He was started on metoprolol for rate control.  He was seen by Adline Peals, PA on 07/17/2022 and had converted to sinus rhythm spontaneously.  Required no DCCV and discussed the possibility of wearing event monitor to quantify AF burden.  Erik Perez presents today for follow-up of new onset atrial fibrillation with his wife.  Since last being seen in the office patient reports that he has been doing well and not feeling any arrhythmia or tachycardia.  He does endorse chest pain with exertion that is reproducible.  He notes that when he was in atrial fibrillation he had a similar chest discomfort that was accompanied with shortness of breath.  He endorses increased fatigue with his current dose of metoprolol and is compliant with his medications.  Blood pressure today was well controlled at 134/70 and heart rate was 52 bpm.  During his visit we discussed the pathophysiology of atrial fibrillation and possible next steps  in his treatment if rate control is not maintained.  I encouraged him to purchase the Kardia monitor for home monitoring of his heart rate and rhythm.  Patient denies chest pain, palpitations, dyspnea, PND, orthopnea, nausea, vomiting, dizziness, syncope, edema, weight gain, or early satiety.  Home Medications    Current Outpatient Medications  Medication Sig Dispense Refill   allopurinol (ZYLOPRIM) 100 MG tablet Take 200 mg by mouth daily.     amLODipine (NORVASC) 5 MG tablet Take 5 mg by mouth 2 (two) times daily.     apixaban (ELIQUIS) 5 MG  TABS tablet Take 1 tablet (5 mg total) by mouth 2 (two) times daily. 60 tablet 3   atorvastatin (LIPITOR) 40 MG tablet Take 40 mg by mouth daily.     calcium gluconate 500 MG tablet Take 1 tablet by mouth 3 (three) times daily.     Cholecalciferol (VITAMIN D) 2000 UNITS tablet Take 2,000 Units by mouth 2 (two) times daily.      Cyanocobalamin (B-12 IJ) Take one (1) injection into the muscle once a month.     doxazosin (CARDURA) 4 MG tablet Take 4 mg by mouth daily.     fexofenadine (ALLEGRA) 180 MG tablet Take 180 mg by mouth daily.     furosemide (LASIX) 20 MG tablet Take 1 tablet (20 mg total) by mouth daily. TAKE 1 TABLET DAILY FOR 3 TIMES A DAY THEN TAKE ON A AS NEEDED BASIS 30 tablet 1   lisinopril (PRINIVIL,ZESTRIL) 40 MG tablet Take 40 mg by mouth daily.     Multiple Vitamin (MULTIVITAMIN) tablet Take 1 tablet by mouth daily.     nitroGLYCERIN (NITROSTAT) 0.4 MG SL tablet Place 1 tablet (0.4 mg total) under the tongue every 5 (five) minutes as needed for chest pain. 25 tablet 1   Omega-3 Fatty Acids (FISH OIL) 1200 MG CAPS Take 1,200 mg by mouth 2 (two) times daily.     SILDENAFIL CITRATE PO Take 100 mg by mouth daily as needed for erectile dysfunction.     hydrochlorothiazide (HYDRODIURIL) 25 MG tablet Take 1.5 tablets (37.5 mg total) by mouth daily. 45 tablet 3   metoprolol tartrate 75 MG TABS Take 75 mg by mouth 2 (two) times daily. 30 tablet 2   No current facility-administered medications for this visit.     Review of Systems  Please see the history of present illness.    (+) Fatigue (+) Chest discomfort  All other systems reviewed and are otherwise negative except as noted above.  Physical Exam    Wt Readings from Last 3 Encounters:  08/14/22 228 lb (103.4 kg)  07/17/22 231 lb (104.8 kg)  07/10/22 220 lb (99.8 kg)   VS: Vitals:   08/14/22 1029  BP: 134/70  Pulse: (!) 52  SpO2: 96%  ,Body mass index is 31.8 kg/m.  Constitutional:      Appearance: Healthy  appearance. Not in distress.  Neck:     Vascular: JVD normal.  Pulmonary:     Effort: Pulmonary effort is normal.     Breath sounds: No wheezing. No rales. Diminished in the bases Cardiovascular:     Normal rate. Regular rhythm. Normal S1. Normal S2.      Murmurs: There is no murmur.  Edema:    Peripheral edema absent.  Abdominal:     Palpations: Abdomen is soft non tender. There is no hepatomegaly.  Skin:    General: Skin is warm and dry.  Neurological:     General:  No focal deficit present.     Mental Status: Alert and oriented to person, place and time.     Cranial Nerves: Cranial nerves are intact.  EKG/LABS/Other Studies Reviewed    ECG personally reviewed by me today -sinus bradycardia with rate of 52 bpm and no acute changes noted similar to previous EKGs.  Risk Assessment/Calculations:    CHA2DS2-VASc Score = 4   This indicates a 4.8% annual risk of stroke. The patient's score is based upon: CHF History: 0 HTN History: 1 Diabetes History: 0 Stroke History: 0 Vascular Disease History: 1 Age Score: 2 Gender Score: 0           Lab Results  Component Value Date   WBC 5.4 07/10/2022   HGB 11.9 (L) 07/10/2022   HCT 34.6 (L) 07/10/2022   MCV 99.7 07/10/2022   PLT 125 (L) 07/10/2022   Lab Results  Component Value Date   CREATININE 1.95 (H) 07/10/2022   BUN 27 (H) 07/10/2022   NA 141 07/10/2022   K 3.8 07/10/2022   CL 110 07/10/2022   CO2 21 (L) 07/10/2022   Lab Results  Component Value Date   ALT 31 07/10/2022   AST 25 07/10/2022   ALKPHOS 43 07/10/2022   BILITOT 1.3 (H) 07/10/2022   Lab Results  Component Value Date   CHOL 123 05/22/2014   HDL 28.60 (L) 05/22/2014   LDLCALC 67 05/22/2014   TRIG 135.0 05/22/2014   CHOLHDL 4 05/22/2014    No results found for: "HGBA1C"  Assessment & Plan    1.  Paroxysmal atrial fibrillation: -New onset AF diagnosed on 07/10/2022 with rate control initiated with metoprolol and started on Eliquis 5 mg twice  daily.  His last creatinine was 1.95 and we will recheck BMET today to monitor correct dose. -He denies any adverse bleeding events or side effects with Eliquis. -He has noticed increased fatigue with current dose of metoprolol 100 mg and we will decrease to 75 mg twice daily -He is maintaining sinus rhythm today at 52 bpm and we will order 30-day event monitor to check AF burden arrhythmia. -CHA2DS2-VASc Score = 4 [CHF History: 0, HTN History: 1, Diabetes History: 0, Stroke History: 0, Vascular Disease History: 1, Age Score: 2, Gender Score: 0].  Therefore, the patient's annual risk of stroke is 4.8 %.      2.  Coronary artery disease: -s/p CABG 2000 with PCI andT axus stent in the native circumflex in 2009. -Today patient reports chest discomfort with ambulation and increased walking.  He endorses reproducible chest discomfort and states that similar discomfort occurred when he was in atrial fibrillation. -Continue GDMT with atorvastatin 40 mg, metoprolol 100 mg -He was advised to follow-up if chest pain becomes increased in intensity and is not relieved with nitroglycerin -He will complete Lexiscan Myoview to rule out possible ischemia.   3.  Essential hypertension: -Continue lisinopril 40 mg and metoprolol as noted above -We will increase HCTZ to 37.5 for better BP control. -BMET in 2 weeks  4.  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 Hyperlipidemia: -Patient's last LDL cholesterol was 64 in 2020.  We will contact PCP for most recent readings. -Continue statin therapy as noted above    Disposition: Follow-up with Sinclair Grooms, MD or APP in 2 months Shared Decision Making/Informed Consent The risks [chest pain, shortness of breath, cardiac arrhythmias, dizziness,  blood pressure fluctuations, myocardial infarction, stroke/transient ischemic attack, nausea,  vomiting, allergic reaction, radiation exposure, metallic taste sensation and life-threatening complications (estimated to be 1 in 10,000)], benefits (risk stratification, diagnosing coronary artery disease, treatment guidance) and alternatives of a nuclear stress test were discussed in detail with Erik Perez and he agrees to proceed.   Medication Adjustments/Labs and Tests Ordered: Current medicines are reviewed at length with the patient today.  Concerns regarding medicines are outlined above.   Signed, Mable Fill, Marissa Nestle, NP 08/14/2022, 11:32 AM [facility]  Note:  This document was prepared using Dragon voice recognition software and may include unintentional dictation errors.

## 2022-08-14 ENCOUNTER — Encounter (INDEPENDENT_AMBULATORY_CARE_PROVIDER_SITE_OTHER): Payer: Medicare PPO

## 2022-08-14 ENCOUNTER — Ambulatory Visit: Payer: Medicare PPO | Attending: Nurse Practitioner | Admitting: Nurse Practitioner

## 2022-08-14 ENCOUNTER — Encounter: Payer: Self-pay | Admitting: Nurse Practitioner

## 2022-08-14 VITALS — BP 134/70 | HR 52 | Ht 71.0 in | Wt 228.0 lb

## 2022-08-14 DIAGNOSIS — I480 Paroxysmal atrial fibrillation: Secondary | ICD-10-CM | POA: Diagnosis not present

## 2022-08-14 DIAGNOSIS — I6523 Occlusion and stenosis of bilateral carotid arteries: Secondary | ICD-10-CM

## 2022-08-14 DIAGNOSIS — I2510 Atherosclerotic heart disease of native coronary artery without angina pectoris: Secondary | ICD-10-CM | POA: Diagnosis not present

## 2022-08-14 DIAGNOSIS — E785 Hyperlipidemia, unspecified: Secondary | ICD-10-CM

## 2022-08-14 DIAGNOSIS — I10 Essential (primary) hypertension: Secondary | ICD-10-CM

## 2022-08-14 MED ORDER — HYDROCHLOROTHIAZIDE 25 MG PO TABS: 37.5000 mg | ORAL_TABLET | Freq: Every day | ORAL | 3 refills | Status: DC

## 2022-08-14 MED ORDER — METOPROLOL TARTRATE 75 MG PO TABS: 75.0000 mg | ORAL_TABLET | Freq: Two times a day (BID) | ORAL | 2 refills | Status: DC

## 2022-08-14 MED ORDER — FUROSEMIDE 20 MG PO TABS: 20.0000 mg | ORAL_TABLET | Freq: Every day | ORAL | 1 refills | Status: DC

## 2022-08-14 NOTE — Patient Instructions (Addendum)
Medication Instructions:  DECREASE Metoprolol to '75mg'$  take 1 tablet twice a day  INCREASE Hydrochlorothiazide to 37.'5mg'$  take 1 tablet once a day  START Lasix '20mg'$  Take 1 tablet daily for 3 days then take on a as needed basis. *If you need a refill on your cardiac medications before your next appointment, please call your pharmacy*   Lab Work: [place] If you have labs (blood work) drawn today and your tests are completely normal, you will receive your results only by: [place] (if you have MyChart) OR A paper copy in the mail If you have any lab test that is abnormal or we need to change your treatment, we will call you to review the results.   Testing/Procedures: Your physician has requested that you have a lexiscan myoview. For further information please visit [email or web address]. Please follow instruction sheet, as given.  Your physician has recommended that you wear an 30DAY event monitor. Event monitors are medical devices that record the heart's electrical activity. Doctors most often [place] these monitors to diagnose arrhythmias. Arrhythmias are problems with the speed or rhythm of the heartbeat. The monitor is a small, portable device. You can wear one while you do your normal daily activities. This is usually used to diagnose what is causing palpitations/syncope (passing out).   Follow-Up: At [facility], you and your health needs are our priority.  As part of our continuing mission to provide you with exceptional heart care, we have created designated Provider Care Teams.  These Care Teams include your primary Cardiologist (physician) and Advanced Practice Providers (APPs -  Physician Assistants and Nurse Practitioners) who all work together to provide you with the care you need, when you need it.  We recommend signing up for the patient portal called "MyChart".  Sign up information is provided on this After Visit Summary.  MyChart is used to connect with patients  for Virtual Visits (Telemedicine).  Patients are able to view lab/test results, encounter notes, upcoming appointments, etc.  Non-urgent messages can be sent to your provider as well.   To learn more about what you can do with MyChart, go to [email or web address].    Your next appointment:   2 month(s)  The format for your next appointment:   In Person  Provider:   Ambrose Pancoast, NP      Other Instructions CHECK YOUR BLOOD PRESSURE DAILY AND DAILY FOR 1-2 WEEKS AND CONTACT THE OFFICE WITH READINGS.   Important Information About Sugar

## 2022-08-18 ENCOUNTER — Encounter (HOSPITAL_COMMUNITY): Payer: Self-pay | Admitting: *Deleted

## 2022-08-18 ENCOUNTER — Telehealth (HOSPITAL_COMMUNITY): Payer: Self-pay | Admitting: *Deleted

## 2022-08-18 NOTE — Telephone Encounter (Signed)
Called patient to review instructions for upcoming stress test. Patient requested that instructions be sent via my chart. Instructions sent.  Kirstie Peri

## 2022-08-20 ENCOUNTER — Emergency Department (HOSPITAL_COMMUNITY): Payer: Medicare PPO

## 2022-08-20 ENCOUNTER — Encounter (HOSPITAL_COMMUNITY): Payer: Medicare PPO

## 2022-08-20 ENCOUNTER — Other Ambulatory Visit: Payer: Self-pay

## 2022-08-20 ENCOUNTER — Encounter (HOSPITAL_COMMUNITY): Payer: Self-pay

## 2022-08-20 ENCOUNTER — Telehealth: Payer: Self-pay | Admitting: Interventional Cardiology

## 2022-08-20 ENCOUNTER — Emergency Department (HOSPITAL_COMMUNITY)
Admission: EM | Admit: 2022-08-20 | Discharge: 2022-08-20 | Disposition: A | Discharge status: Home / Self Care | Payer: Medicare PPO | Attending: Emergency Medicine | Admitting: Emergency Medicine

## 2022-08-20 DIAGNOSIS — I25708 Atherosclerosis of coronary artery bypass graft(s), unspecified, with other forms of angina pectoris: Secondary | ICD-10-CM | POA: Diagnosis not present

## 2022-08-20 DIAGNOSIS — I10 Essential (primary) hypertension: Secondary | ICD-10-CM | POA: Diagnosis not present

## 2022-08-20 DIAGNOSIS — Z7901 Long term (current) use of anticoagulants: Secondary | ICD-10-CM | POA: Diagnosis not present

## 2022-08-20 DIAGNOSIS — Z79899 Other long term (current) drug therapy: Secondary | ICD-10-CM | POA: Diagnosis not present

## 2022-08-20 DIAGNOSIS — Z4502 Encounter for adjustment and management of automatic implantable cardiac defibrillator: Secondary | ICD-10-CM | POA: Insufficient documentation

## 2022-08-20 DIAGNOSIS — Z0189 Encounter for other specified special examinations: Secondary | ICD-10-CM

## 2022-08-20 DIAGNOSIS — I2510 Atherosclerotic heart disease of native coronary artery without angina pectoris: Secondary | ICD-10-CM | POA: Diagnosis not present

## 2022-08-20 DIAGNOSIS — I4891 Unspecified atrial fibrillation: Secondary | ICD-10-CM | POA: Insufficient documentation

## 2022-08-20 DIAGNOSIS — R079 Chest pain, unspecified: Secondary | ICD-10-CM | POA: Diagnosis present

## 2022-08-20 DIAGNOSIS — Z951 Presence of aortocoronary bypass graft: Secondary | ICD-10-CM | POA: Diagnosis not present

## 2022-08-20 DIAGNOSIS — I480 Paroxysmal atrial fibrillation: Secondary | ICD-10-CM | POA: Diagnosis not present

## 2022-08-20 LAB — CBC
HCT: 36.4 % — ABNORMAL LOW (ref 39.0–52.0)
Hemoglobin: 12 g/dL — ABNORMAL LOW (ref 13.0–17.0)
MCH: 33.3 pg (ref 26.0–34.0)
MCHC: 33 g/dL (ref 30.0–36.0)
MCV: 101.1 fL — ABNORMAL HIGH (ref 80.0–100.0)
Platelets: 111 10*3/uL — ABNORMAL LOW (ref 150–400)
RBC: 3.6 MIL/uL — ABNORMAL LOW (ref 4.22–5.81)
RDW: 13.9 % (ref 11.5–15.5)
WBC: 4.5 10*3/uL (ref 4.0–10.5)
nRBC: 0 % (ref 0.0–0.2)

## 2022-08-20 LAB — BASIC METABOLIC PANEL
Anion gap: 15 (ref 5–15)
BUN: 33 mg/dL — ABNORMAL HIGH (ref 8–23)
CO2: 21 mmol/L — ABNORMAL LOW (ref 22–32)
Calcium: 9.9 mg/dL (ref 8.9–10.3)
Chloride: 105 mmol/L (ref 98–111)
Creatinine, Ser: 2.33 mg/dL — ABNORMAL HIGH (ref 0.61–1.24)
GFR, Estimated: 28 mL/min — ABNORMAL LOW (ref >60)
Glucose, Bld: 124 mg/dL — ABNORMAL HIGH (ref 70–99)
Potassium: 4.2 mmol/L (ref 3.5–5.1)
Sodium: 141 mmol/L (ref 135–145)

## 2022-08-20 LAB — TROPONIN I (HIGH SENSITIVITY)
Troponin I (High Sensitivity): 9 ng/L (ref <18)
Troponin I (High Sensitivity): 10 ng/L (ref <18)

## 2022-08-20 LAB — PROTIME-INR
INR: 1.2 (ref 0.8–1.2)
Prothrombin Time: 15.1 seconds (ref 11.4–15.2)

## 2022-08-20 LAB — MAGNESIUM: Magnesium: 1.8 mg/dL (ref 1.7–2.4)

## 2022-08-20 MED ORDER — SODIUM CHLORIDE 0.9 % IV SOLN
INTRAVENOUS | Status: AC | PRN
  Administered 2022-08-20: 1000 mL via INTRAVENOUS

## 2022-08-20 MED ORDER — MIDAZOLAM HCL 2 MG/2ML IJ SOLN
INTRAMUSCULAR | Status: AC | PRN
  Administered 2022-08-20: .5 mg via INTRAVENOUS

## 2022-08-20 MED ORDER — AMIODARONE HCL 400 MG PO TABS: ORAL_TABLET | ORAL | 0 refills | Status: DC

## 2022-08-20 MED ORDER — APIXABAN 5 MG PO TABS
5.0000 mg | ORAL_TABLET | Freq: Two times a day (BID) | ORAL | Status: DC
  Administered 2022-08-20: 5 mg via ORAL
  Filled 2022-08-20: qty 1

## 2022-08-20 MED ORDER — DILTIAZEM LOAD VIA INFUSION
20.0000 mg | Freq: Once | INTRAVENOUS | Status: AC
  Administered 2022-08-20: 20 mg via INTRAVENOUS
  Filled 2022-08-20: qty 20

## 2022-08-20 MED ORDER — AMIODARONE HCL 200 MG PO TABS
400.0000 mg | ORAL_TABLET | Freq: Two times a day (BID) | ORAL | Status: DC
  Administered 2022-08-20: 400 mg via ORAL
  Filled 2022-08-20: qty 2

## 2022-08-20 MED ORDER — ETOMIDATE 2 MG/ML IV SOLN
10.0000 mg | Freq: Once | INTRAVENOUS | Status: DC | PRN
  Filled 2022-08-20: qty 10

## 2022-08-20 MED ORDER — ETOMIDATE 2 MG/ML IV SOLN
INTRAVENOUS | Status: AC | PRN
  Administered 2022-08-20: 10 mg via INTRAVENOUS

## 2022-08-20 MED ORDER — MIDAZOLAM HCL 2 MG/2ML IJ SOLN
1.0000 mg | Freq: Once | INTRAMUSCULAR | Status: DC | PRN
  Filled 2022-08-20: qty 2

## 2022-08-20 MED ORDER — DILTIAZEM HCL-DEXTROSE 125-5 MG/125ML-% IV SOLN (PREMIX)
5.0000 mg/h | INTRAVENOUS | Status: DC
  Administered 2022-08-20: 5 mg/h via INTRAVENOUS
  Filled 2022-08-20: qty 125

## 2022-08-20 NOTE — Telephone Encounter (Signed)
Pt is returning call.  

## 2022-08-20 NOTE — Telephone Encounter (Signed)
[facility] calling with critical EKG results

## 2022-08-20 NOTE — ED Provider Notes (Signed)
  Physical Exam  BP (!) 147/59   Pulse 61   Temp 97.8 F (36.6 C) (Oral)   Resp 14   SpO2 97%   Physical Exam  Procedures  .Cardioversion  Date/Time: 08/20/2022 3:03 PM  Performed by: Isla Pence, MD Authorized by: Isla Pence, MD   Consent:    Consent obtained:  Written   Consent given by:  Patient   Alternatives discussed:  No treatment Pre-procedure details:    Cardioversion basis:  Emergent   Rhythm:  Atrial fibrillation   Electrode placement:  Anterior-posterior Patient sedated: Yes. Refer to sedation procedure documentation for details of sedation.  Attempt one:    Shock (Joules):  200   Shock outcome:  Conversion to normal sinus rhythm Post-procedure details:    Patient status:  Awake   Patient tolerance of procedure:  Tolerated well, no immediate complications .Sedation  Date/Time: 08/20/2022 3:04 PM  Performed by: Isla Pence, MD Authorized by: Isla Pence, MD   Consent:    Consent obtained:  Written Universal protocol:    Immediately prior to procedure, a time out was called: yes     Patient identity confirmed:  Arm band Indications:    Procedure performed:  Cardioversion Pre-sedation assessment:    Time since last food or drink:  5   ASA classification: class 4 - patient with severe systemic disease that is a constant threat to life     Mallampati score:  III - soft palate, base of uvula visible   Pre-sedation assessments completed and reviewed: airway patency, cardiovascular function, hydration status, mental status, nausea/vomiting, pain level, respiratory function and temperature   Procedure details (see MAR for exact dosages):    Preoxygenation:  Nasal cannula   Sedation:  Etomidate and midazolam   Intended level of sedation: deep   Total Provider sedation time (minutes):  30 Post-procedure details:    Post-sedation assessment completed:  08/20/2022 2:45 PM   Attendance: Constant attendance by certified staff until patient  recovered     Recovery: Patient returned to pre-procedure baseline     Procedure completion:  Tolerated well, no immediate complications   ED Course / MDM    Medical Decision Making Amount and/or Complexity of Data Reviewed Labs: ordered. Radiology: ordered.  Risk Prescription drug management.   I provided a substantive portion of the care of this patient.  I personally performed the entirety of the history, exam, and medical decision making for this encounter.  EKG Interpretation  Date/Time:  Wednesday August 20 2022 06:51:03 EST Ventricular Rate:  117 PR Interval:    QRS Duration: 88 QT Interval:  334 QTC Calculation: 465 R Axis:   95 Text Interpretation: Atrial fibrillation with rapid ventricular response Rightward axis Septal infarct , age undetermined Abnormal ECG When compared with ECG of 17-Jul-2022 14:59, PREVIOUS ECG IS PRESENT now in afib with rvr (hx afib) Confirmed by Isla Pence [phone]) on 08/20/2022 7:07:04 AM    Pt presents to the ED today with palpitations and cp.  Pt is in afib with rvr.  He's been compliant with his Eliquis.  Cards did see pt and recommends cardioversion.  Pt cardioverted with 200 J to NSR.  Cards recommended amiodarone.  They will f/u with pt.  Pt is stable for d/c.  Return if worse.     Isla Pence, MD 08/20/22 [phone]

## 2022-08-20 NOTE — ED Triage Notes (Signed)
Pt BIB [facility] from home, c/o non-radiating chest pain that started this morning when he woke up. Given '324mg'$  asa, pain resolved but has returned. Hx CABG and afib, currently wearing heart monitor and scheduled for a stress test this am.

## 2022-08-20 NOTE — ED Notes (Signed)
EDP [place], RT, ED pharmacy, NT & this RN at bedside with all equipment ready to begin cardioversion.

## 2022-08-20 NOTE — Telephone Encounter (Signed)
Erik Perez from [employer]-1st occurrence on monitor. SR at 80 then Afib rates of 120 sustains for 21 sec at 7:07 CST. Currently in afib- dx of paf. New to monitor-Autodetected.   Patient seen 08/14/22 by Ambrose Pancoast: Erik Perez was admitted on 07/10/2022 with new onset atrial fibrillation.  High-sensitivity troponins were negative x2 patient's creatinine was 1.95 with normal thyroid studies.  He was started on Eliquis 5 mg twice daily with plan to undergo DCCV in 3 weeks.  He was started on metoprolol for rate control.  He was seen by Adline Peals, PA on 07/17/2022 and had converted to sinus rhythm spontaneously.  Required no DCCV and discussed the possibility of wearing event monitor to quantify AF burden.   Called and left a message for patient to call back. Patient currently admitted at [facility] for chest pain. Will route to covering MD as Juluis Rainier.

## 2022-08-20 NOTE — Sedation Documentation (Signed)
Pt shocked one time with 200 J & converted to NSR.

## 2022-08-20 NOTE — Telephone Encounter (Signed)
Returned call to patient.  Patient states he was returning previous call from this morning.  Patient states he started having chest pain around 5:30 this morning. He called 911 and was seen in ED today. Monitor alert received showing a-fib occurred while patient was in the ED.  Patient states he was cardioverted back into NSR in the ED. He has [facility]-up appts [facility] on 08[facility]22 and with Ambrose Pancoast, NP on 09/04/22.  Patient states he is feeling much better now than he was this morning and verbalized understanding of ED discharge instructions/follow-up.

## 2022-08-20 NOTE — ED Provider Notes (Signed)
[facility] EMERGENCY DEPARTMENT Provider Note   CSN: [id number] Arrival date & time: 08/20/22  0947     History HTN,CAD,OSA Chief Complaint  Patient presents with   Chest Pain    Erik Perez is a 76 y.o. male.  76 y.o male with a PMH of CAD, HTN, OSA, new onset Afib on Eliquis presents to the ED with a chief complaint of chest pain which began two hours. Patient reports he was lying in bed when suddenly he began his heart racing, began to have severe pressure behind the left side of his chest behind where his Holter monitor is currently located.  Patient was newly diagnosed with A-fib on October 2023 and placed on metoprolol along with Eliquis.  He reports compliance on this medication.  He recently had his metoprolol increased to help with rate control.  He is due to have a stress test this morning.  The episode was described with additional shortness of breath.  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  He did not take anything to help with improvement in symptoms.Patient is followed by [facility] Daneen Schick of [facility]. No fever, no cough, no leg swelling.   The history is provided by the patient and medical records.  Chest Pain Pain location:  L chest Pain quality: pressure   Pain radiates to:  Does not radiate Pain severity:  Moderate Onset quality:  Sudden Duration:  2 hours Timing:  Intermittent Progression:  Unchanged Chronicity:  Recurrent Relieved by:  Nothing Worsened by:  Nothing Ineffective treatments:  None tried Associated symptoms: palpitations and shortness of breath   Associated symptoms: no abdominal pain, no back pain, no fever, no headache, no nausea and no vomiting   Risk factors: coronary artery disease and male sex   Risk factors: no prior DVT/PE        Home Medications Prior to Admission medications   Medication Sig Start Date End Date Taking? Authorizing  Provider  amiodarone (PACERONE) 400 MG tablet Take 400 mg amiodarone twice a day for 1 week, then take it once a day. 08/20/22  Yes Isla Pence, MD  allopurinol (ZYLOPRIM) 100 MG tablet Take 200 mg by mouth daily.    [provider]  amLODipine (NORVASC) 5 MG tablet Take 5 mg by mouth 2 (two) times daily.    [provider]  apixaban (ELIQUIS) 5 MG TABS tablet Take 1 tablet (5 mg total) by mouth 2 (two) times daily. 07/17/22   Fenton, Clint R, PA  atorvastatin (LIPITOR) 40 MG tablet Take 40 mg by mouth daily.    [provider]  calcium gluconate 500 MG tablet Take 1 tablet by mouth 3 (three) times daily.    [provider]  Cholecalciferol (VITAMIN D) 2000 UNITS tablet Take 2,000 Units by mouth 2 (two) times daily.     [provider]  Cyanocobalamin (B-12 IJ) Take one (1) injection into the muscle once a month.    [provider]  doxazosin (CARDURA) 4 MG tablet Take 4 mg by mouth daily. 03/27/20   [provider]  fexofenadine (ALLEGRA) 180 MG tablet Take 180 mg by mouth daily.    [provider]  furosemide (LASIX) 20 MG tablet Take 1 tablet (20 mg total) by mouth daily. TAKE 1 TABLET DAILY FOR 3 TIMES A DAY THEN TAKE ON A AS NEEDED BASIS 08/14/22   Marylu Lund., NP  hydrochlorothiazide (HYDRODIURIL) 25 MG tablet Take 1.5 tablets (37.5 mg total) by mouth daily. 08/14/22   Marylu Lund., NP  lisinopril (PRINIVIL,ZESTRIL) 40 MG tablet Take 40 mg by mouth daily.    [provider]  metoprolol tartrate 75 MG TABS Take 75 mg by mouth 2 (two) times daily. 08/14/22   Marylu Lund., NP  Multiple Vitamin (MULTIVITAMIN) tablet Take 1 tablet by mouth daily.    [provider]  nitroGLYCERIN (NITROSTAT) 0.4 MG SL tablet Place 1 tablet (0.4 mg total) under the tongue every 5 (five) minutes as needed for chest pain. 07/18/22   Belva Crome, MD  Omega-3 Fatty Acids (FISH OIL) 1200 MG CAPS Take 1,200  mg by mouth 2 (two) times daily.    [provider]  SILDENAFIL CITRATE PO Take 100 mg by mouth daily as needed for erectile dysfunction.    [provider]      Allergies    Colchicine, Diclofenac sodium, Gabapentin, and Niaspan [niacin er]    Review of Systems   Review of Systems  Constitutional:  Negative for chills and fever.  HENT:  Negative for sore throat.   Respiratory:  Positive for shortness of breath.   Cardiovascular:  Positive for chest pain and palpitations. Negative for leg swelling.  Gastrointestinal:  Negative for abdominal pain, nausea and vomiting.  Genitourinary:  Negative for flank pain.  Musculoskeletal:  Negative for back pain.  Skin:  Negative for pallor and wound.  Neurological:  Negative for light-headedness and headaches.  All other systems reviewed and are negative.   Physical Exam Updated Vital Signs BP (!) 140/56   Pulse 69   Temp 97.6 F (36.4 C)   Resp 15   SpO2 98%  Physical Exam Vitals and nursing note reviewed.  Constitutional:      Appearance: He is well-developed. He is not ill-appearing.  HENT:     Head: Normocephalic and atraumatic.  Cardiovascular:     Rate and Rhythm: Rhythm irregular.     Pulses:          Radial pulses are 2+ on the left side.  Pulmonary:     Effort: Pulmonary effort is normal.     Breath sounds: Normal breath sounds. No decreased breath sounds.  Chest:     Chest wall: No tenderness.  Abdominal:     Palpations: Abdomen is soft.     Tenderness: There is no abdominal tenderness.  Musculoskeletal:     Cervical back: Normal range of motion and neck supple.     Right lower leg: No tenderness. No edema.     Left lower leg: No tenderness. No edema.  Skin:    General: Skin is warm and dry.  Neurological:     Mental Status: He is alert and oriented to person, place, and time.     ED Results / Procedures / Treatments   Labs (all labs ordered are listed, but only abnormal results are  displayed) Labs Reviewed  BASIC METABOLIC PANEL - Abnormal; Notable for the following components:      Result Value   CO2 21 (*)    Glucose, Bld 124 (*)    BUN 33 (*)    Creatinine, Ser 2.33 (*)    GFR, Estimated 28 (*)    All other components within normal limits  CBC - Abnormal; Notable for the following components:   RBC 3.60 (*)    Hemoglobin 12.0 (*)    HCT 36.4 (*)  MCV 101.1 (*)    Platelets 111 (*)    All other components within normal limits  PROTIME-INR  MAGNESIUM  TROPONIN I (HIGH SENSITIVITY)  TROPONIN I (HIGH SENSITIVITY)    EKG EKG Interpretation  Date/Time:  Wednesday August 20 2022 06:51:03 EST Ventricular Rate:  117 PR Interval:    QRS Duration: 88 QT Interval:  334 QTC Calculation: 465 R Axis:   95 Text Interpretation: Atrial fibrillation with rapid ventricular response Rightward axis Septal infarct , age undetermined Abnormal ECG When compared with ECG of 17-Jul-2022 14:59, PREVIOUS ECG IS PRESENT now in afib with rvr (hx afib) Confirmed by Isla Pence [phone]) on 08/20/2022 7:07:04 AM  Radiology DG Chest Port 1 View  Result Date: 08/20/2022 CLINICAL DATA:  76 year old male with chest pain.  Prior CABG. EXAM: PORTABLE CHEST 1 VIEW COMPARISON:  Portable chest 07/10/2022 and earlier. FINDINGS: Portable AP semi upright view at 0718 hours. Lower lung volumes. Rectangular generator device projects over the left mediastinum today, possibly external artifact (uncertain). Otherwise mediastinal contours are within normal limits status post CABG. Allowing for portable technique the lungs are clear. No pneumothorax or pleural effusion. Paucity of bowel gas in the visible abdomen. ACDF hardware more obscured today. No acute osseous abnormality identified. IMPRESSION: 1. Lower lung volumes with no acute cardiopulmonary abnormality. 2. New rectangular electronic device projects over the left mediastinum, presumably external artifact. Electronically Signed   By: Genevie Ann M.D.   On: 08/20/2022 07:51    Procedures .Critical Care  Performed by: Janeece Fitting, PA-C Authorized by: Janeece Fitting, PA-C   Critical care provider statement:    Critical care time (minutes):  30   Critical care start time:  08/20/2022 12:45 PM   Critical care end time:  08/20/2022 1:15 PM   Critical care was necessary to treat or prevent imminent or life-threatening deterioration of the following conditions:  Cardiac failure   Critical care was time spent personally by me on the following activities:  Discussions with consultants, evaluation of patient's response to treatment, examination of patient, obtaining history from patient or surrogate, ordering and review of laboratory studies, pulse oximetry and review of old charts     Medications Ordered in ED Medications  apixaban (ELIQUIS) tablet 5 mg (5 mg Oral Given 08/20/22 1157)  etomidate (AMIDATE) injection 10 mg (has no administration in time range)  midazolam (VERSED) injection 1 mg (has no administration in time range)  amiodarone (PACERONE) tablet 400 mg (400 mg Oral Given 08/20/22 1157)  diltiazem (CARDIZEM) 1 mg/mL load via infusion 20 mg (20 mg Intravenous Bolus from Bag 08/20/22 0816)  0.9 %  sodium chloride infusion (0 mLs Intravenous Stopped 08/20/22 1251)  midazolam (VERSED) injection (0.5 mg Intravenous Given 08/20/22 1226)  etomidate (AMIDATE) injection (10 mg Intravenous Given 08/20/22 1226)    ED Course/ Medical Decision Making/ A&P                           Medical Decision Making Amount and/or Complexity of Data Reviewed Labs: ordered. Radiology: ordered.  Risk Prescription drug management.    This patient presents to the ED for concern of CHEST PAIN, this involves a number of treatment options, and is a complaint that carries with it a high risk of complications and morbidity.  The differential diagnosis includes ACS, PE, cardiac arrhythmia, versus infection.    Co morbidities: Discussed in  HPI   Brief History:  Patient here with sudden onset of  chest pain upon waking up this morning around 530, noted his monitor to need a battery change, therefore woke up and felt his heart racing. Diagnosed with A-fib on October 2023 started on metoprolol along with Eliquis which she reports compliance.  It supposed to have a stress test this morning.  Also endorsing some shortness of breath with this episode.Heart rate waxing and waning   EMR reviewed including pt PMHx, past surgical history and past visits to ER.   See HPI for more details   Lab Tests:  I ordered and independently interpreted labs.  The pertinent results include:    Labs notable for CBC with no leukocytosis.  Hemoglobin is slightly decreased however no source of bleeding reported.  BMP reports no electrolyte department.  Creatinine level is slightly increased since his prior.  BUN is slightly elevated.  Troponin x 2 have remained flat.  Magnesium is within normal limits.  PT and INR at within normal limits.   Imaging Studies:  NAD. I personally reviewed all imaging studies and no acute abnormality found. I agree with radiology interpretation.  Cardiac Monitoring:  The patient was maintained on a cardiac monitor.  I personally viewed and interpreted the cardiac monitored which showed an underlying rhythm of: irregularly irregular EKG non-ischemic   Medicines ordered:  I ordered medication including amiodarone  for rate control due to HR in the 130's Reevaluation of the patient after these medicines showed that the patient improved I have reviewed the patients home medicines and have made adjustments as needed   Critical Interventions:  Cardioversion was required after patient had continued Afib and failed rate control with medication.    Consults:  7:34 AM I requested consultation with [place] cardiology APP,  and discussed lab and imaging findings as well as pertinent plan - they will evaluate patient while  in the ED.     Reevaluation:  After the interventions noted above I re-evaluated patient and found that they have :resolved   Social Determinants of Health:  The patient's social determinants of health were a factor in the care of this patient   Problem List / ED Course:  Patient here with sudden onset of chest pressure that began around 2 hours prior to arrival in the ED, noted to be in A-fib per his monitor.  Was complaining of some chest pressure arrived to the ED in A-fib with RVR with a heart rate in the 130s.  Known history of A-fib currently anticoagulated on Eliquis and rate control with metoprolol.  Recent visit to heart care actually 5 days ago, scheduled for a stress test this morning around 745. Notes from that visit reviewed by me,  most recent 2D echo was completed 03/2022 showing EF of 60-65%, no RWMA, normal RV systolic function with no evidence of MV or AV regurgitation and no LVH.  Bilateral carotid Dopplers completed showing right ICA 40-59% and left ICA with 1-39%.    According to his prior cardiology visit 5 days ago, patient did not require cardioversion due to spontaneously burning to normal sinus rhythm, he was given a Holter monitor in order to quantify AF burden.  Patient on these prior notes cardiology consult was placed. Cardiology requested cardioversion.  Successful cardioversion was performed with Versed, etomidate by My attending Dr. Gilford Raid.  Now is in normal sinus rhythm.  He was monitored for approximately 1-1/2 hours after his procedure without any further pain or complaints.  He continues to be normal sinus rhythm with a heart rate in  the 70s. Neurology did have a change of medication prescribing him now amiodarone for outpatient therapy.  He will need to follow-up with his A-fib clinic at his earliest convenience. He is hemodynamically stable for discharge.   Dispostion:  After consideration of the diagnostic results and the patients response to treatment,  I feel that the patent would benefit from follow up with Afib clinic and his cardiologist.     Portions of this note were generated with Dragon dictation software. Dictation errors may occur despite best attempts at proofreading.   Final Clinical Impression(s) / ED Diagnoses Final diagnoses:  Atrial fibrillation, unspecified type [facility])  Encounter for cardioversion procedure    Rx / DC Orders ED Discharge Orders          Ordered    amiodarone (PACERONE) 400 MG tablet        08/20/22 1330              Janeece Fitting, PA-C 08/20/22 1444    Isla Pence, MD 08/20/22 1510

## 2022-08-20 NOTE — Consult Note (Addendum)
Cardiology Consultation   Patient ID: SMARAN GAUS MRN: [id number]; DOB: May 01, 1946  Admit date: 08/20/2022 Date of Consult: 08/20/2022  PCP:  Lurline Del, [facility] Cardiologist:  Sinclair Grooms, MD        Patient Profile:   Erik Perez is a 76 y.o. male with a hx of atrial fibrillation (on Eliquis) CAD s/p CABG ([place] LAD; RIMA RCA, 2009) and Taxus stent in the native circumflex, hypertension, OSA on CPAP, prostate cancer, AAA, hyperlipidemia, and bilateral carotid disease  who is being seen 08/20/2022 for the evaluation of afib with RVR at the request of Johana Soto PA-C.  History of Present Illness:   Mr. Jumper presented to the hospital via EMS after onset of rapid heart rate with chest pain and shortness of breath this morning. Patient says that he awoke around 5:30 with these symptoms. Chest discomfort is described as a pressure left/substernal area. At its worst, rated 6/10, now currently 1/10. Aside from this morning, patient denies symptoms of chest pain at rest or dyspnea. He admits that with significant exertion, he sometimes feels mild chest discomfort and shortness of breath but reports resolution with rest. He has not experienced any significant palpitations in the last month and denies orthopnea. Patient was previously admitted in the setting of similar symptoms on 07/10/22, new onset afib with RVR. Decision made at that time to initiate Eliquis '5mg'$  and plan for outpatient DCCV. Metoprolol started for rate control. Upon follow up in the afib clinic on 07/17/22, patient was noted to have spontaneously converted back to NSR. Lopressor continued at '100mg'$  BID though it was noted that patient had some fatigue. Today patient reports full compliance with Eliquis, no missed doses. He also reports nightly use of CPAP. Reports drinking approximately 1.5 oz of liquor 4 times per week.  Patient followed up with general cardiology on 11/16  and a 30 day monitor was ordered. Decision made to reduce Lopressor to '75mg'$  BID due to fatigue. At that same appointment, he reported some lower extremity edema for which he was given PO lasix. Patient took '20mg'$  Fri, Sat, Sun, Mon, Tues with improvement. Given patient's stable angina symptoms, a Lexiscan Myoview was ordered and scheduled to be completed today.   Of note, Occupational hygienist office that they detected afib with a ventricular rate of 120bpm this morning at 6:07 EST.  Initial labs in the ED found troponin 9, repeat 10. Creatinine elevated at 2.33. HGB 12, HCT 36.4, platelet 111.    Past Medical History:  Diagnosis Date   Carotid artery obstruction    Bilateral moderate carotid obstruction 2013 doppler   Coronary artery disease    Hyperlipidemia    Hypertension    OSA (obstructive sleep apnea)    Split 02-08-07 AHI total 32/hr; REM 60/hr O2 sat min NREM 88% REM 88%    Past Surgical History:  Procedure Laterality Date   CERVICAL DISC SURGERY  2009   CORONARY ARTERY BYPASS GRAFT     [place], to LAD, RIMA to RCA 2000,Cfx. Taxus stent 2002     Home Medications:  Prior to Admission medications   Medication Sig Start Date End Date Taking? Authorizing Provider  allopurinol (ZYLOPRIM) 100 MG tablet Take 200 mg by mouth daily.    [provider]  amLODipine (NORVASC) 5 MG tablet Take 5 mg by mouth 2 (two) times daily.    [provider]  apixaban (ELIQUIS) 5 MG TABS tablet Take  1 tablet (5 mg total) by mouth 2 (two) times daily. 07/17/22   Fenton, Clint R, PA  atorvastatin (LIPITOR) 40 MG tablet Take 40 mg by mouth daily.    [provider]  calcium gluconate 500 MG tablet Take 1 tablet by mouth 3 (three) times daily.    [provider]  Cholecalciferol (VITAMIN D) 2000 UNITS tablet Take 2,000 Units by mouth 2 (two) times daily.     [provider]  Cyanocobalamin (B-12 IJ) Take one (1) injection into the muscle once a  month.    [provider]  doxazosin (CARDURA) 4 MG tablet Take 4 mg by mouth daily. 03/27/20   [provider]  fexofenadine (ALLEGRA) 180 MG tablet Take 180 mg by mouth daily.    [provider]  furosemide (LASIX) 20 MG tablet Take 1 tablet (20 mg total) by mouth daily. TAKE 1 TABLET DAILY FOR 3 TIMES A DAY THEN TAKE ON A AS NEEDED BASIS 08/14/22   Marylu Lund., NP  hydrochlorothiazide (HYDRODIURIL) 25 MG tablet Take 1.5 tablets (37.5 mg total) by mouth daily. 08/14/22   Marylu Lund., NP  lisinopril (PRINIVIL,ZESTRIL) 40 MG tablet Take 40 mg by mouth daily.    [provider]  metoprolol tartrate 75 MG TABS Take 75 mg by mouth 2 (two) times daily. 08/14/22   Marylu Lund., NP  Multiple Vitamin (MULTIVITAMIN) tablet Take 1 tablet by mouth daily.    [provider]  nitroGLYCERIN (NITROSTAT) 0.4 MG SL tablet Place 1 tablet (0.4 mg total) under the tongue every 5 (five) minutes as needed for chest pain. 07/18/22   Belva Crome, MD  Omega-3 Fatty Acids (FISH OIL) 1200 MG CAPS Take 1,200 mg by mouth 2 (two) times daily.    [provider]  SILDENAFIL CITRATE PO Take 100 mg by mouth daily as needed for erectile dysfunction.    [provider]    Inpatient Medications: Scheduled Meds:  Continuous Infusions:  diltiazem (CARDIZEM) infusion 7.5 mg/hr (08/20/22 0929)   PRN Meds:   Allergies:    Allergies  Allergen Reactions   Colchicine Other (See Comments)   Diclofenac Sodium     Chest Pain   Gabapentin     Increased sedation    Niaspan [Niacin Er]     Involuntary facial flushing    Social History:   Social History   Socioeconomic History   Marital status: Married    Spouse name: Not on file   Number of children: Not on file   Years of education: Not on file   Highest education level: Not on file  Occupational History   Not on file  Tobacco Use   Smoking status: Former    Packs/day: 0.33     Years: 23.00    Total pack years: 7.59    Types: Cigarettes    Start date: 105    Quit date: 1985    Years since quitting: 38.9   Smokeless tobacco: Never   Tobacco comments:    Former smoker 07/17/22  Substance and Sexual Activity   Alcohol use: Yes    Comment: social   Drug use: No   Sexual activity: Not on file  Other Topics Concern   Not on file  Social History Narrative   Not on file   Social Determinants of Health   Financial Resource Strain: Not on file  Food Insecurity: Not on file  Transportation Needs: Not on file  Physical  Activity: Not on file  Stress: Not on file  Social Connections: Not on file  Intimate Partner Violence: Not on file    Family History:    Family History  Problem Relation Age of Onset   Heart disease Mother    Heart disease Father    Heart disease Brother      ROS:  Please see the history of present illness.   All other ROS reviewed and negative.     Physical Exam/Data:   Vitals:   08/20/22 0923 08/20/22 0930 08/20/22 0945 08/20/22 1000  BP:  (!) 111/52 117/61 138/71  Pulse: 74 76 63 98  Resp: '15 14 14 16  '$ Temp:      TempSrc:      SpO2: 97% 97% 97% 98%   No intake or output data in the 24 hours ending 08/20/22 1037    08/14/2022   10:29 AM 07/17/2022    2:47 PM 07/10/2022    8:59 AM  Last 3 Weights  Weight (lbs) 228 lb 231 lb 220 lb  Weight (kg) 103.42 kg 104.781 kg 99.791 kg     There is no height or weight on file to calculate BMI.  General:  Well nourished, well developed, in no acute distress HEENT: normal Neck: no JVD Vascular: Distal pulses 2+ bilaterally Cardiac:  normal S1, S2; irregularly irregular and rapid, no murmur  Lungs:  clear to auscultation bilaterally, no wheezing, rhonchi or rales  Abd: soft, nontender, no hepatomegaly  Ext: no significant peripheral edema Musculoskeletal:  No deformities, BUE and BLE strength normal and equal Skin: warm and dry  Neuro:  CNs 2-12 intact, no focal  abnormalities noted Psych:  Normal affect   EKG:  The EKG was personally reviewed and demonstrates:  atrial fibrillation. No acute ST segment changes or T wave inversion. T wave inversions in leads III and AVF noted in previous afib ECG from 07/10/22 Telemetry:  Telemetry was personally reviewed and demonstrates:  afib with RVR. Overall decrease in ventricular rate follow initiation of diltiazem.  Relevant CV Studies:  04/11/2022 TTE  IMPRESSIONS     1. Left ventricular ejection fraction, by estimation, is 60 to 65%. Left  ventricular ejection fraction by 3D volume is 60 %. The left ventricle has  normal function. The left ventricle has no regional wall motion  abnormalities. Left ventricular diastolic   parameters were normal.   2. Right ventricular systolic function is normal. The right ventricular  size is normal. Tricuspid regurgitation signal is inadequate for assessing  PA pressure.   3. The mitral valve is normal in structure. No evidence of mitral valve  regurgitation. No evidence of mitral stenosis.   4. The aortic valve is tricuspid. Aortic valve regurgitation is not  visualized. Aortic valve sclerosis/calcification is present, without any  evidence of aortic stenosis.   5. The inferior vena cava is normal in size with greater than 50%  respiratory variability, suggesting right atrial pressure of 3 mmHg.   FINDINGS   Left Ventricle: Left ventricular ejection fraction, by estimation, is 60  to 65%. Left ventricular ejection fraction by 3D volume is 60 %. The left  ventricle has normal function. The left ventricle has no regional wall  motion abnormalities. The left  ventricular internal cavity size was normal in size. There is no left  ventricular hypertrophy. Left ventricular diastolic parameters were  normal.   Right Ventricle: The right ventricular size is normal. No increase in  right ventricular wall thickness. Right ventricular systolic  function is  normal.  Tricuspid regurgitation signal is inadequate for assessing PA  pressure.   Left Atrium: Left atrial size was normal in size.   Right Atrium: Right atrial size was normal in size.   Pericardium: There is no evidence of pericardial effusion.   Mitral Valve: The mitral valve is normal in structure. No evidence of  mitral valve regurgitation. No evidence of mitral valve stenosis.   Tricuspid Valve: The tricuspid valve is normal in structure. Tricuspid  valve regurgitation is trivial.   Aortic Valve: The aortic valve is tricuspid. Aortic valve regurgitation is  not visualized. Aortic valve sclerosis/calcification is present, without  any evidence of aortic stenosis.   Pulmonic Valve: The pulmonic valve was not well visualized. Pulmonic valve  regurgitation is trivial.   Aorta: The aortic root and ascending aorta are structurally normal, with  no evidence of dilitation.   Venous: The inferior vena cava is normal in size with greater than 50%  respiratory variability, suggesting right atrial pressure of 3 mmHg.   IAS/Shunts: The interatrial septum was not well visualized.    Laboratory Data:  High Sensitivity Troponin:   Recent Labs  Lab 08/20/22 0744  TROPONINIHS 9     Chemistry Recent Labs  Lab 08/20/22 0744 08/20/22 0831  NA 141  --   K 4.2  --   CL 105  --   CO2 21*  --   GLUCOSE 124*  --   BUN 33*  --   CREATININE 2.33*  --   CALCIUM 9.9  --   MG  --  1.8  GFRNONAA 28*  --   ANIONGAP 15  --     No results for input(s): "PROT", "ALBUMIN", "AST", "ALT", "ALKPHOS", "BILITOT" in the last 168 hours. Lipids No results for input(s): "CHOL", "TRIG", "HDL", "LABVLDL", "LDLCALC", "CHOLHDL" in the last 168 hours.  Hematology Recent Labs  Lab 08/20/22 0744  WBC 4.5  RBC 3.60*  HGB 12.0*  HCT 36.4*  MCV 101.1*  MCH 33.3  MCHC 33.0  RDW 13.9  PLT 111*   Thyroid No results for input(s): "TSH", "FREET4" in the last 168 hours.  BNPNo results for input(s): "BNP",  "PROBNP" in the last 168 hours.  DDimer No results for input(s): "DDIMER" in the last 168 hours.   Radiology/Studies:  DG Chest Port 1 View  Result Date: 08/20/2022 CLINICAL DATA:  76 year old male with chest pain.  Prior CABG. EXAM: PORTABLE CHEST 1 VIEW COMPARISON:  Portable chest 07/10/2022 and earlier. FINDINGS: Portable AP semi upright view at 0718 hours. Lower lung volumes. Rectangular generator device projects over the left mediastinum today, possibly external artifact (uncertain). Otherwise mediastinal contours are within normal limits status post CABG. Allowing for portable technique the lungs are clear. No pneumothorax or pleural effusion. Paucity of bowel gas in the visible abdomen. ACDF hardware more obscured today. No acute osseous abnormality identified. IMPRESSION: 1. Lower lung volumes with no acute cardiopulmonary abnormality. 2. New rectangular electronic device projects over the left mediastinum, presumably external artifact. Electronically Signed   By: Genevie Ann M.D.   On: 08/20/2022 07:51     Assessment and Plan:   Paroxysmal atrial fibrillation with rapid ventricular response Secondary hypercoagulable state  Patient presenting to the ED 2/2 chest discomfort and dyspnea in the setting of afib with RVR. Initiated on Diltiazem infusion with overall reduction in ventricular rates. Reports complete compliance with home Eliquis, no missed doses since initiation.  Given ~5 weeks of Eliquis with no missed doses,  will plan DCCV today and initiation of Amiodarone, '400mg'$  BID x7 days then '400mg'$  daily until EP follow up. Antiarrhythmic options are limited by CKD Will set up outpatient EP follow up for consideration of ablation given patient's significant symptoms while with RVR.  Hx CAD Hyperlipidemia  Patient is s/p CABG in 2000 with PCI and Taxus stent in the Lcx in 2009. He reports predictable exertional dypsnea/chest pain and was originally scheduled for [employer] today.  Today he has chest pain at rest in the setting of afib with RVR (improved with rate control). Troponin negative/flat at 9, 10.  Suspect that there may be a component of underlying CAD progression though patient's symptoms are predictable/stable angina by his description.  Will prioritize rhythm management as patient is generally without chest discomfort when not experiencing RVR.  Myoview Lexiscan can be rescheduled/reconsidered in outpatient setting Continue Atorvastatin '40mg'$   Hypertension  Patient on home regimen of Lisinopril '40mg'$ , HCTZ 37.'5mg'$  (increased on 08/14/22), Amlodipine '5mg'$  BID. BP appears well controlled, K is stable. Given recent report of leg swelling without any other evidence of volume overload, wonder about the possibility of CCB induced edema. Could consider reducing Amlodipine to '5mg'$  once daily.  CKD stage IIIb-IV  Patient with creatinine elevated to 2.33. Recent baseline appears to be closer to 2. Suspect this increase is due to recent short course of Lasix. Would avoid further loop diuresis and consideration reducing Amlodipine as above.   Carotid artery disease AAA  AAA measured at 3.4cm at last study on 05/30/22. Continue yearly monitoring. Carotid stenosis also stable as of 05/30/22 carotid duplex.  Risk Assessment/Risk Scores:          CHA2DS2-VASc Score = 4   This indicates a 4.8% annual risk of stroke. The patient's score is based upon: CHF History: 0 HTN History: 1 Diabetes History: 0 Stroke History: 0 Vascular Disease History: 1 Age Score: 2 Gender Score: 0         For questions or updates, please contact [place] Please consult [email or web address] for contact info under    Signed, Lily Kocher, PA-C  08/20/2022 10:37 AM

## 2022-08-20 NOTE — ED Notes (Signed)
Consent obtained from pt & witnessed by this RN for procedural sedation & is at bedside.

## 2022-08-20 NOTE — Discharge Instructions (Signed)
You are prescribed amiodarone by your cardiologist.  You will need to take 1 tablet twice a day for the next week.  Then you will need to take the medication once a day.  You will need to follow-up with your cardiologist at your earliest convenience.  If you experience any chest pain, shortness of breath, worsening symptoms you will need to return to the emergency department.

## 2022-08-26 ENCOUNTER — Encounter: Payer: Self-pay | Admitting: Interventional Cardiology

## 2022-08-28 ENCOUNTER — Ambulatory Visit: Payer: Medicare PPO | Attending: Nurse Practitioner

## 2022-08-28 ENCOUNTER — Encounter: Payer: Self-pay | Admitting: Interventional Cardiology

## 2022-08-28 DIAGNOSIS — I6523 Occlusion and stenosis of bilateral carotid arteries: Secondary | ICD-10-CM

## 2022-08-28 DIAGNOSIS — I2510 Atherosclerotic heart disease of native coronary artery without angina pectoris: Secondary | ICD-10-CM

## 2022-08-28 DIAGNOSIS — I480 Paroxysmal atrial fibrillation: Secondary | ICD-10-CM

## 2022-08-28 DIAGNOSIS — I10 Essential (primary) hypertension: Secondary | ICD-10-CM

## 2022-08-29 ENCOUNTER — Ambulatory Visit (HOSPITAL_COMMUNITY)
Admit: 2022-08-29 | Discharge: 2022-08-29 | Disposition: A | Discharge status: Home / Self Care | Payer: Medicare PPO | Attending: Nurse Practitioner | Admitting: Nurse Practitioner

## 2022-08-29 ENCOUNTER — Encounter: Payer: Self-pay | Admitting: Interventional Cardiology

## 2022-08-29 ENCOUNTER — Other Ambulatory Visit: Payer: Self-pay

## 2022-08-29 ENCOUNTER — Encounter (HOSPITAL_COMMUNITY): Payer: Self-pay | Admitting: Nurse Practitioner

## 2022-08-29 VITALS — BP 122/54 | HR 52 | Ht 71.0 in | Wt 235.8 lb

## 2022-08-29 DIAGNOSIS — E785 Hyperlipidemia, unspecified: Secondary | ICD-10-CM | POA: Insufficient documentation

## 2022-08-29 DIAGNOSIS — I480 Paroxysmal atrial fibrillation: Secondary | ICD-10-CM | POA: Diagnosis not present

## 2022-08-29 DIAGNOSIS — I10 Essential (primary) hypertension: Secondary | ICD-10-CM | POA: Insufficient documentation

## 2022-08-29 DIAGNOSIS — D6869 Other thrombophilia: Secondary | ICD-10-CM | POA: Diagnosis not present

## 2022-08-29 DIAGNOSIS — G4733 Obstructive sleep apnea (adult) (pediatric): Secondary | ICD-10-CM | POA: Insufficient documentation

## 2022-08-29 DIAGNOSIS — Z8546 Personal history of malignant neoplasm of prostate: Secondary | ICD-10-CM | POA: Insufficient documentation

## 2022-08-29 DIAGNOSIS — Z951 Presence of aortocoronary bypass graft: Secondary | ICD-10-CM | POA: Diagnosis not present

## 2022-08-29 DIAGNOSIS — Z6832 Body mass index (BMI) 32.0-32.9, adult: Secondary | ICD-10-CM | POA: Diagnosis not present

## 2022-08-29 DIAGNOSIS — E669 Obesity, unspecified: Secondary | ICD-10-CM | POA: Diagnosis not present

## 2022-08-29 DIAGNOSIS — I2510 Atherosclerotic heart disease of native coronary artery without angina pectoris: Secondary | ICD-10-CM | POA: Insufficient documentation

## 2022-08-29 LAB — BASIC METABOLIC PANEL
BUN/Creatinine Ratio: 15 (ref 10–24)
BUN: 43 mg/dL — ABNORMAL HIGH (ref 8–27)
CO2: 21 mmol/L (ref 20–29)
Calcium: 9.4 mg/dL (ref 8.6–10.2)
Chloride: 111 mmol/L — ABNORMAL HIGH (ref 96–106)
Creatinine, Ser: 2.93 mg/dL — ABNORMAL HIGH (ref 0.76–1.27)
Glucose: 112 mg/dL — ABNORMAL HIGH (ref 70–99)
Potassium: 5.5 mmol/L — ABNORMAL HIGH (ref 3.5–5.2)
Sodium: 144 mmol/L (ref 134–144)
eGFR: 21 mL/min/{1.73_m2} — ABNORMAL LOW (ref >59)

## 2022-08-29 MED ORDER — AMIODARONE HCL 200 MG PO TABS: 200.0000 mg | ORAL_TABLET | Freq: Every day | ORAL | 1 refills | Status: DC

## 2022-08-29 NOTE — Progress Notes (Signed)
Primary Care Physician: Lurline Del, DO Primary Cardiologist: Dr Tamala Julian Primary Electrophysiologist: none Referring Physician: [facility]   Erik Perez is a 76 y.o. male with a history of CAD s/p CABG, HTN, OSA, prostate CA, HLD, carotid artery disease, atrial fibrillation who presents for consultation in the [facility].  The patient was initially diagnosed with atrial fibrillation 07/10/22 after presenting to the ED with symptoms of tachypalpitations and chest pain. ECG showed new onset afib. His home metoprolol was increased for rate control and he was started on Eliquis for a CHADS2VASC score of 4. Patient reports compliance with his CPAP and denies significant alcohol use. There were no specific triggers for his afib that he could identify. He is back in SR today.   F/u visit form ED 08/20/22. He presented to the ED with onset of afib with chest pain. He was compliant with BB and eliquis. H is currently wearing a heart monitor and is scheduled for a stress test as outpt 09/05/22. He did have a successful cardioversion and cardiology recommend starting him on po amiodarone.AAD choices are limited 2/2 CKD. He is compliant with CPAP, no alcohol use.   In the afib clinic, he is in [place] and is continuing on amiodarone 400 mg bid and will go to 400 mg a day per instruction tomorrow. I am changing this to 1/2 tab of 400 mg bid for 2 more  weeks then go to 200 mg daily.  He has f/u with Ambrose Pancoast, NP, 12/7. We discussed he may be an ablation candidate so he will not have to be on amiodarone long term and will request appointment.   Today, he denies symptoms of palpitations, chest pain, shortness of breath, orthopnea, PND, lower extremity edema, dizziness, presyncope, syncope, bleeding, or neurologic sequela. The patient is tolerating medications without difficulties and is otherwise without complaint today.    Atrial Fibrillation Risk Factors:  he does have  symptoms or diagnosis of sleep apnea. he is compliant with CPAP therapy. he does not have a history of rheumatic fever. he does not have a history of alcohol use.   he has a BMI of Body mass index is 32.22 kg/m.Marland Kitchen Filed Weights   07/17/22 1447  Weight: 104.8 kg    Family History  Problem Relation Age of Onset   Heart disease Mother    Heart disease Father    Heart disease Brother      Atrial Fibrillation Management history:  Previous antiarrhythmic drugs: none Previous cardioversions: none Previous ablations: none CHADS2VASC score: 4 Anticoagulation history: Eliquis   Past Medical History:  Diagnosis Date   Carotid artery obstruction    Bilateral moderate carotid obstruction 2013 doppler   Coronary artery disease    Hyperlipidemia    Hypertension    OSA (obstructive sleep apnea)    Split 02-08-07 AHI total 32/hr; REM 60/hr O2 sat min NREM 88% REM 88%   Past Surgical History:  Procedure Laterality Date   CERVICAL DISC SURGERY  2009   [place], to LAD, RIMA to RCA 2000,Cfx. Taxus stent 2002    Current Outpatient Medications  Medication Sig Dispense Refill   allopurinol (ZYLOPRIM) 100 MG tablet Take 200 mg by mouth daily.     amLODipine (NORVASC) 5 MG tablet Take 5 mg by mouth 2 (two) times daily.     apixaban (ELIQUIS) 5 MG TABS tablet Take 1 tablet (5 mg total) by mouth 2 (  two) times daily. 60 tablet 3   atorvastatin (LIPITOR) 40 MG tablet Take 40 mg by mouth daily.     calcium gluconate 500 MG tablet Take 1 tablet by mouth 3 (three) times daily.     Cholecalciferol (VITAMIN D) 2000 UNITS tablet Take 2,000 Units by mouth 2 (two) times daily.      Cyanocobalamin (B-12 IJ) Take one (1) injection into the muscle once a month.     doxazosin (CARDURA) 4 MG tablet Take 4 mg by mouth daily.     fexofenadine (ALLEGRA) 180 MG tablet Take 180 mg by mouth daily.     hydrochlorothiazide (HYDRODIURIL) 25 MG tablet Take 25 mg by mouth daily.      lisinopril (PRINIVIL,ZESTRIL) 40 MG tablet Take 40 mg by mouth daily.     metoprolol tartrate (LOPRESSOR) 100 MG tablet Take 1 tablet (100 mg total) by mouth 2 (two) times daily. 90 tablet 2   Multiple Vitamin (MULTIVITAMIN) tablet Take 1 tablet by mouth daily.     nitroGLYCERIN (NITROSTAT) 0.4 MG SL tablet Place 0.4 mg under the tongue every 5 (five) minutes as needed for chest pain.     Omega-3 Fatty Acids (FISH OIL) 1200 MG CAPS Take 1,200 mg by mouth 2 (two) times daily.     SILDENAFIL CITRATE PO Take 100 mg by mouth daily as needed for erectile dysfunction.     No current facility-administered medications for this encounter.    Allergies  Allergen Reactions   Colchicine Other (See Comments)   Diclofenac Sodium     Chest Pain   Gabapentin     Increased sedation    Niaspan [Niacin Er]     Involuntary facial flushing    Social History   Socioeconomic History   Marital status: Married    Spouse name: Not on file   Number of children: Not on file   Years of education: Not on file   Highest education level: Not on file  Occupational History   Not on file  Tobacco Use   Smoking status: Former    Packs/day: 0.33    Years: 23.00    Total pack years: 7.59    Types: Cigarettes    Start date: 11    Quit date: 1985    Years since quitting: 38.8   Smokeless tobacco: Never   Tobacco comments:    Former smoker 07/17/22  Substance and Sexual Activity   Alcohol use: Yes    Comment: social   Drug use: No   Sexual activity: Not on file  Other Topics Concern   Not on file  Social History Narrative   Not on file   Social Determinants of Health   Financial Resource Strain: Not on file  Food Insecurity: Not on file  Transportation Needs: Not on file  Physical Activity: Not on file  Stress: Not on file  Social Connections: Not on file  Intimate Partner Violence: Not on file     ROS- All systems are reviewed and negative except as per the HPI above.  Physical  Exam: Vitals:   07/17/22 1447  BP: 136/62  Pulse: (!) 54  Weight: 104.8 kg  Height: '5\' 11"'$  (1.803 m)    GEN- The patient is a well appearing elderly male, alert and oriented x 3 today.   Head- normocephalic, atraumatic Eyes-  Sclera clear, conjunctiva pink Ears- hearing intact Oropharynx- clear Neck- supple  Lungs- Clear to ausculation bilaterally, normal work of breathing Heart- Regular rate and rhythm, no  murmurs, rubs or gallops  GI- soft, NT, ND, + BS Extremities- no clubbing, cyanosis, or edema MS- no significant deformity or atrophy Skin- no rash or lesion Psych- euthymic mood, full affect Neuro- strength and sensation are intact  Wt Readings from Last 3 Encounters:  07/17/22 104.8 kg  07/10/22 99.8 kg  08/08/21 102.8 kg    EKG today demonstrates   Vent. rate 52 BPM PR interval 122 ms QRS duration 96 ms QT/QTcB 470/437 ms P-R-T axes 59 91 59 Sinus bradycardia Rightward axis Septal infarct , age undetermined Abnormal ECG When compared with ECG of 20-Aug-2022 12:52, PREVIOUS ECG IS PRESENT  Echo 04/11/22 demonstrated   1. Left ventricular ejection fraction, by estimation, is 60 to 65%. Left  ventricular ejection fraction by 3D volume is 60 %. The left ventricle has normal function. The left ventricle has no regional wall motion  abnormalities. Left ventricular diastolic parameters were normal.   2. Right ventricular systolic function is normal. The right ventricular  size is normal. Tricuspid regurgitation signal is inadequate for assessing PA pressure.   3. The mitral valve is normal in structure. No evidence of mitral valve  regurgitation. No evidence of mitral stenosis.   4. The aortic valve is tricuspid. Aortic valve regurgitation is not  visualized. Aortic valve sclerosis/calcification is present, without any  evidence of aortic stenosis.   5. The inferior vena cava is normal in size with greater than 50%  respiratory variability, suggesting right atrial  pressure of 3 mmHg.   Epic records are reviewed at length today  CHA2DS2-VASc Score = 4  The patient's score is based upon: CHF History: 0 HTN History: 1 Diabetes History: 0 Stroke History: 0 Vascular Disease History: 1 Age Score: 2 Gender Score: 0       ASSESSMENT AND PLAN: 1. Paroxysmal Atrial Fibrillation (ICD10:  I48.0) The patient's CHA2DS2-VASc score is 4, indicating a 4.8% annual risk of stroke.   Pt had second ED visit for afib with RVR/chest pain and was successfully cardioverted and started on amiodarone load by general cardiology He will have finished amio 400 mg bid x one week and starting tomorrow go to 1/2 tab of 400 mg bid for  2 weeks then 200 mg daily  Continue Eliquis 5 mg BID Continue Lopressor 75 mg BID. This may be able to be reduced as he gets more amiodarone on board We discussed he may be a good ablation candidate in a few months so he will not have to be on amiodarone long term, will refer to EP   2. Secondary Hypercoagulable State (ICD10:  D68.69) The patient is at significant risk for stroke/thromboembolism based upon his CHA2DS2-VASc Score of 4.  Continue Apixaban (Eliquis).   3. Obesity Body mass index is 32.22 kg/m. Lifestyle modification was discussed at length including regular exercise and weight reduction.  4. Obstructive sleep apnea Patient reports compliance with CPAP therapy.  5. CAD S/p CABG Pending a stress test 12/8 as he has chest pain with onset of afib with rvr  6. HTN Stable, no changes today.   Follow up with  cardiology as scheduled Consult with EP requested   Butch Penny C. Haidyn Kilburg, [facility] [address] [place], [place] [address] [phone]

## 2022-08-29 NOTE — Patient Instructions (Signed)
Tomorrow decrease amiodarone to '200mg'$  twice a day for 2 weeks (12/16) then reduce to '200mg'$  once a day going forward

## 2022-09-02 DIAGNOSIS — H60331 Swimmer's ear, right ear: Secondary | ICD-10-CM | POA: Insufficient documentation

## 2022-09-02 DIAGNOSIS — H6122 Impacted cerumen, left ear: Secondary | ICD-10-CM | POA: Diagnosis not present

## 2022-09-03 NOTE — Progress Notes (Unsigned)
Office Visit    Patient Name: TABITHA TUPPER Date of Encounter: 09/03/2022  Primary Care Provider:  Lurline Del, DO Primary Cardiologist:  Sinclair Grooms, MD Primary Electrophysiologist: None  Chief Complaint    SHONTEZ SERMON is a 76 y.o. male with PMH of new onset atrial fibrillation (on Eliquis) CAD s/p CABG ([place] LAD; RIMA RCA, 2009) and Taxus stent in the native circumflex, hypertension, OSA on CPAP, prostate CA, AAA, hyperlipidemia, and bilateral carotid disease who presents today for follow-up of atrial fibrillation.   Past Medical History    Past Medical History:  Diagnosis Date   Carotid artery obstruction    Bilateral moderate carotid obstruction 2013 doppler   Coronary artery disease    Hyperlipidemia    Hypertension    OSA (obstructive sleep apnea)    Split 02-08-07 AHI total 32/hr; REM 60/hr O2 sat min NREM 88% REM 88%   Past Surgical History:  Procedure Laterality Date   CERVICAL DISC SURGERY  2009   CORONARY ARTERY BYPASS GRAFT     [place], to LAD, RIMA to RCA 2000,Cfx. Taxus stent 2002    Allergies  Allergies  Allergen Reactions   Colchicine Other (See Comments)   Diclofenac Sodium     Chest Pain   Gabapentin     Increased sedation    Niaspan [Niacin Er]     Involuntary facial flushing    History of Present Illness    ANSEN SAYEGH  is a 76 year old male with the above mention past medical history who presents today for follow-up of atrial fibrillation.  [name] has been followed by Dr. Tamala Julian since 2015 for management of coronary artery disease.  He has a history of CABG in 2000 and Taxus stent placed to the circumflex in 2009.  Most recent 2D echo was completed 03/2022 showing EF of 60-65%, no RWMA, normal RV systolic function with no evidence of MV or AV regurgitation and no LVH.  Bilateral carotid Dopplers completed showing right ICA 40-59% and left ICA with 1-39%.  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  He was last seen by Dr. Tamala Julian on 07/2021 and was doing well with no cardiac complaints.   [name] was admitted on 07/10/2022 with new onset atrial fibrillation.  High-sensitivity troponins were negative x2 patient's creatinine was 1.95 with normal thyroid studies.  He was started on Eliquis 5 mg twice daily with plan to undergo DCCV in 3 weeks.  He was started on metoprolol for rate control.  He was seen by Adline Peals, PA on 07/17/2022 and had converted to sinus rhythm spontaneously.  Required no DCCV and discussed the possibility of wearing event monitor to quantify AF burden.  He was seen by me 08/14/2022 for complaint of new onset AF.  During visit patient endorsed increased fatigue and blood pressure was noted to be 134/70.  He he was feeling well during office visit and heart rate was 52 bpm.  He noticed some increased fatigue with metoprolol 100 mg and this was reduced to 75 mg twice daily.  He was given a 30-day event monitor to assess AF burden.  He also endorsed chest discomfort with ambulation and Lexiscan Myoview was ordered for further evaluation.  Blood pressure was noted to be also slightly elevated and HCTZ was increased to 37.5 mg.  He was seen in the ED 08/20/2021 after going  into AF with RVR and was cardioverted to sinus rhythm.  He was started on amiodarone due to CKD.  He was seen by Roderic Palau on 12/1 in the AF clinic for further evaluation.  They discussed the possibility of possible AF ablation candidacy after maintaining rate control on amiodarone.  He was referred to EP for further evaluation.   [name] presents today for follow-up with his wife.  Since last being seen in the office patient reports that he has been doing well and has not had any bouts of atrial fibrillation since his DCCV last month.  He does note occasional bouts of shortness of breath and dizziness since  starting amiodarone.  He is otherwise compliant with his current medication regimen and reports no other adverse reactions or missed doses.  His blood pressure today was well-controlled at 130/60 and was noted to be in the 130s over 140s at home.  He is aware if he is in A-fib and reports that he has not felt any symptoms such as shortness of breath or dizziness.  He also reports no chest pain since his previous visit but does note the same amount of fatigue.  He was euvolemic on exam with chronic lower extremity edema and right leg and trace amount and left leg.  During his visit we reviewed the next steps in his treatment plan which include a referral to EP next month.  We also discussed the risk of taking amiodarone long-term and patient is aware of side effects and had no other questions at this time..  Patient denies chest pain, palpitations, dyspnea, PND, orthopnea, nausea, vomiting, dizziness, syncope, edema, weight gain, or early satiety.  Home Medications    Current Outpatient Medications  Medication Sig Dispense Refill   allopurinol (ZYLOPRIM) 100 MG tablet Take 200 mg by mouth daily.     amiodarone (PACERONE) 200 MG tablet Take 1 tablet (200 mg total) by mouth daily. 90 tablet 1   amLODipine (NORVASC) 5 MG tablet Take 5 mg by mouth 2 (two) times daily.     apixaban (ELIQUIS) 5 MG TABS tablet Take 1 tablet (5 mg total) by mouth 2 (two) times daily. 60 tablet 3   atorvastatin (LIPITOR) 40 MG tablet Take 40 mg by mouth daily.     calcium gluconate 500 MG tablet Take 1 tablet by mouth 3 (three) times daily.     Cholecalciferol (VITAMIN D) 2000 UNITS tablet Take 2,000 Units by mouth 2 (two) times daily.      Cyanocobalamin (B-12 IJ) Take one (1) injection into the muscle once a month.     doxazosin (CARDURA) 4 MG tablet Take 4 mg by mouth daily.     fexofenadine (ALLEGRA) 180 MG tablet Take 180 mg by mouth daily.     furosemide (LASIX) 20 MG tablet Take 1 tablet (20 mg total) by mouth daily.  TAKE 1 TABLET DAILY FOR 3 TIMES A DAY THEN TAKE ON A AS NEEDED BASIS 30 tablet 1   hydrochlorothiazide (HYDRODIURIL) 25 MG tablet Take 1.5 tablets (37.5 mg total) by mouth daily. 45 tablet 3   lisinopril (PRINIVIL,ZESTRIL) 40 MG tablet Take 40 mg by mouth daily.     metoprolol tartrate 75 MG TABS Take 75 mg by mouth 2 (two) times daily. 30 tablet 2   Multiple Vitamin (MULTIVITAMIN) tablet Take 1 tablet by mouth daily.     nitroGLYCERIN (NITROSTAT) 0.4 MG SL tablet Place 1 tablet (0.4 mg total) under the tongue every 5 (five) minutes as  needed for chest pain. 25 tablet 1   Omega-3 Fatty Acids (FISH OIL) 1200 MG CAPS Take 1,200 mg by mouth 2 (two) times daily.     SILDENAFIL CITRATE PO Take 100 mg by mouth daily as needed for erectile dysfunction.     No current facility-administered medications for this visit.     Review of Systems  Please see the history of present illness.    (+) Dyspnea on exertion (+) Lower extremity edema  All other systems reviewed and are otherwise negative except as noted above.  Physical Exam    Wt Readings from Last 3 Encounters:  08/29/22 235 lb 12.8 oz (107 kg)  08/14/22 228 lb (103.4 kg)  07/17/22 231 lb (104.8 kg)   TZ:GYFVC were no vitals filed for this visit.,There is no height or weight on file to calculate BMI.  Constitutional:      Appearance: Healthy appearance. Not in distress.  Neck:     Vascular: JVD normal.  Pulmonary:     Effort: Pulmonary effort is normal.     Breath sounds: No wheezing. No rales. Diminished in the bases Cardiovascular:     Normal rate. Regular rhythm. Normal S1. Normal S2.      Murmurs: There is no murmur.  Edema:    Peripheral edema absent.  Abdominal:     Palpations: Abdomen is soft non tender. There is no hepatomegaly.  Skin:    General: Skin is warm and dry.  Neurological:     General: No focal deficit present.     Mental Status: Alert and oriented to person, place and time.     Cranial Nerves: Cranial  nerves are intact.  EKG/LABS/Other Studies Reviewed    ECG personally reviewed by me today -none completed today  Risk Assessment/Calculations:    CHA2DS2-VASc Score = 4   This indicates a 4.8% annual risk of stroke. The patient's score is based upon: CHF History: 0 HTN History: 1 Diabetes History: 0 Stroke History: 0 Vascular Disease History: 1 Age Score: 2 Gender Score: 0           Lab Results  Component Value Date   WBC 4.5 08/20/2022   HGB 12.0 (L) 08/20/2022   HCT 36.4 (L) 08/20/2022   MCV 101.1 (H) 08/20/2022   PLT 111 (L) 08/20/2022   Lab Results  Component Value Date   CREATININE 2.93 (H) 08/28/2022   BUN 43 (H) 08/28/2022   NA 144 08/28/2022   K 5.5 (H) 08/28/2022   CL 111 (H) 08/28/2022   CO2 21 08/28/2022   Lab Results  Component Value Date   ALT 31 07/10/2022   AST 25 07/10/2022   ALKPHOS 43 07/10/2022   BILITOT 1.3 (H) 07/10/2022   Lab Results  Component Value Date   CHOL 123 05/22/2014   HDL 28.60 (L) 05/22/2014   LDLCALC 67 05/22/2014   TRIG 135.0 05/22/2014   CHOLHDL 4 05/22/2014    No results found for: "HGBA1C"  Assessment & Plan    1.  Paroxysmal atrial fibrillation: -New onset AF diagnosed on 07/10/2022 with rate control initiated with amiodarone 200 mg twice daily with dose changing to 20 mg daily next week.  Metoprolol and started on Eliquis 5 mg twice daily.  His last creatinine was 1.95 and we will recheck BMET today to monitor correct dose. -He denies any adverse bleeding events or side effects with Eliquis. -He has noticed increased fatigue with current dose of metoprolol 100 mg and we will decrease to  75 mg twice daily -He is maintaining sinus rhythm today at 50 bpm and we will order 30-day event monitor to check AF burden arrhythmia. -He was cardioverted in the ED last month and has not had any recurrence since that visit. -CHA2DS2-VASc Score = 4 [CHF History: 0, HTN History: 1, Diabetes History: 0, Stroke History: 0,  Vascular Disease History: 1, Age Score: 2, Gender Score: 0].  Therefore, the patient's annual risk of stroke is 4.8 %.       2.  Coronary artery disease: -s/p CABG 2000 with PCI andT axus stent in the native circumflex in 2009. -Today patient reports chest discomfort with ambulation and increased walking.  He endorses reproducible chest discomfort and states that similar discomfort occurred when he was in atrial fibrillation. -Continue GDMT with atorvastatin 40 mg, metoprolol 100 mg -He was advised to follow-up if chest pain becomes increased in intensity and is not relieved with nitroglycerin -He will complete Lexiscan Myoview that was canceled due to AF with RVR last month tomorrow.      3.  Essential hypertension: -Continue lisinopril 40 mg and metoprolol as noted above -We will increase HCTZ to 37.5 for better BP control.   4.  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 Hyperlipidemia: -Patient's last LDL cholesterol was 64 in 2020.  We will contact PCP for most recent readings. -Continue statin therapy as noted above      Disposition: Follow-up with Belva Crome III, MD or APP in 3 months    Medication Adjustments/Labs and Tests Ordered: Current medicines are reviewed at length with the patient today.  Concerns regarding medicines are outlined above.   Signed, Mable Fill, Marissa Nestle, NP 09/03/2022, 12:43 PM [facility]  Note:  This document was prepared using Dragon voice recognition software and may include unintentional dictation errors.

## 2022-09-04 ENCOUNTER — Ambulatory Visit: Payer: Medicare PPO | Attending: Nurse Practitioner | Admitting: Nurse Practitioner

## 2022-09-04 ENCOUNTER — Encounter: Payer: Self-pay | Admitting: Nurse Practitioner

## 2022-09-04 VITALS — BP 130/60 | HR 50 | Ht 71.0 in | Wt 236.0 lb

## 2022-09-04 DIAGNOSIS — E782 Mixed hyperlipidemia: Secondary | ICD-10-CM

## 2022-09-04 DIAGNOSIS — I10 Essential (primary) hypertension: Secondary | ICD-10-CM | POA: Diagnosis not present

## 2022-09-04 DIAGNOSIS — I6523 Occlusion and stenosis of bilateral carotid arteries: Secondary | ICD-10-CM | POA: Diagnosis not present

## 2022-09-04 DIAGNOSIS — I2510 Atherosclerotic heart disease of native coronary artery without angina pectoris: Secondary | ICD-10-CM

## 2022-09-04 DIAGNOSIS — I480 Paroxysmal atrial fibrillation: Secondary | ICD-10-CM

## 2022-09-04 LAB — BASIC METABOLIC PANEL
BUN/Creatinine Ratio: 14 (ref 10–24)
BUN: 37 mg/dL — ABNORMAL HIGH (ref 8–27)
CO2: 21 mmol/L (ref 20–29)
Calcium: 9.6 mg/dL (ref 8.6–10.2)
Chloride: 109 mmol/L — ABNORMAL HIGH (ref 96–106)
Creatinine, Ser: 2.68 mg/dL — ABNORMAL HIGH (ref 0.76–1.27)
Glucose: 115 mg/dL — ABNORMAL HIGH (ref 70–99)
Potassium: 5.6 mmol/L — ABNORMAL HIGH (ref 3.5–5.2)
Sodium: 143 mmol/L (ref 134–144)
eGFR: 24 mL/min/{1.73_m2} — ABNORMAL LOW (ref >59)

## 2022-09-04 NOTE — Telephone Encounter (Signed)
Spoke with patient regarding his stress test scheduled for 09/05/22 and he was given detailed instructions. He was told to arrive 15 minutes earlier than his scheduled time.

## 2022-09-04 NOTE — Patient Instructions (Signed)
Medication Instructions:  Your physician recommends that you continue on your current medications as directed. Please refer to the Current Medication list given to you today. *If you need a refill on your cardiac medications before your next appointment, please call your pharmacy*   Lab Work: TODAY-BMET If you have labs (blood work) drawn today and your tests are completely normal, you will receive your results only by: Stem (if you have MyChart) OR A paper copy in the mail If you have any lab test that is abnormal or we need to change your treatment, we will call you to review the results.   Testing/Procedures: NONE ORDERED   Follow-Up: At [facility], you and your health needs are our priority.  As part of our continuing mission to provide you with exceptional heart care, we have created designated Provider Care Teams.  These Care Teams include your primary Cardiologist (physician) and Advanced Practice Providers (APPs -  Physician Assistants and Nurse Practitioners) who all work together to provide you with the care you need, when you need it.  We recommend signing up for the patient portal called "MyChart".  Sign up information is provided on this After Visit Summary.  MyChart is used to connect with patients for Virtual Visits (Telemedicine).  Patients are able to view lab/test results, encounter notes, upcoming appointments, etc.  Non-urgent messages can be sent to your provider as well.   To learn more about what you can do with MyChart, go to [email or web address].    Your next appointment:   AS SCHEDULED     The format for your next appointment:   In Person  Provider:   Ambrose Pancoast, NP Other Instructions   Important Information About Sugar

## 2022-09-05 ENCOUNTER — Telehealth: Payer: Self-pay | Admitting: Interventional Cardiology

## 2022-09-05 ENCOUNTER — Ambulatory Visit (HOSPITAL_COMMUNITY): Payer: Medicare PPO | Attending: Nurse Practitioner

## 2022-09-05 DIAGNOSIS — I2510 Atherosclerotic heart disease of native coronary artery without angina pectoris: Secondary | ICD-10-CM | POA: Diagnosis not present

## 2022-09-05 DIAGNOSIS — I480 Paroxysmal atrial fibrillation: Secondary | ICD-10-CM | POA: Insufficient documentation

## 2022-09-05 DIAGNOSIS — I10 Essential (primary) hypertension: Secondary | ICD-10-CM | POA: Diagnosis not present

## 2022-09-05 DIAGNOSIS — I6523 Occlusion and stenosis of bilateral carotid arteries: Secondary | ICD-10-CM | POA: Insufficient documentation

## 2022-09-05 LAB — MYOCARDIAL PERFUSION IMAGING
Base ST Depression (mm): 0 mm
LV dias vol: 142 mL (ref 62–150)
LV sys vol: 61 mL
Nuc Stress EF: 57 %
Peak HR: 56 {beats}/min
Rest HR: 51 {beats}/min
Rest Nuclear Isotope Dose: 10.8 mCi
SDS: 4
SRS: 4
SSS: 8
ST Depression (mm): 0 mm
Stress Nuclear Isotope Dose: 31.1 mCi
TID: 1.01

## 2022-09-05 MED ORDER — REGADENOSON 0.4 MG/5ML IV SOLN
0.4000 mg | Freq: Once | INTRAVENOUS | Status: AC
  Administered 2022-09-05: 0.4 mg via INTRAVENOUS

## 2022-09-05 MED ORDER — TECHNETIUM TC 99M TETROFOSMIN IV KIT
10.8000 | PACK | Freq: Once | INTRAVENOUS | Status: AC | PRN
  Administered 2022-09-05: 10.8 via INTRAVENOUS

## 2022-09-05 MED ORDER — TECHNETIUM TC 99M TETROFOSMIN IV KIT
31.1000 | PACK | Freq: Once | INTRAVENOUS | Status: AC | PRN
  Administered 2022-09-05: 31.1 via INTRAVENOUS

## 2022-09-05 NOTE — Telephone Encounter (Signed)
Pt is returning call in regards to results. Requesting call back.  

## 2022-09-05 NOTE — Telephone Encounter (Signed)
Patient notified directly and voiced understanding. Patient states he already has a nephrologist, Dr Elmarie Shiley at [facility]. Copy of labs sent to Dr Posey Pronto.

## 2022-09-09 ENCOUNTER — Telehealth: Payer: Self-pay | Admitting: *Deleted

## 2022-09-09 NOTE — Telephone Encounter (Signed)
Patient called to talk with [facility] in regards to heart monitor. Says that he wants to discuss some issues he has been having. Please call back to discuss

## 2022-09-10 NOTE — Progress Notes (Unsigned)
[id number] from office inventory applied to patient.

## 2022-09-10 NOTE — Telephone Encounter (Signed)
Patient had gotten a few notifications on monitor cell phone regarding poor skin contact.  Explained it is usually not a concern unless he is getting consistent message saying poor skin contact.  His was an intermittent notification which can happen with movement, ie turning over in bed.  He has 2 days to go on monitor and will send it back to [employer] on Saturday.

## 2022-09-11 ENCOUNTER — Emergency Department (HOSPITAL_COMMUNITY)
Admission: EM | Admit: 2022-09-11 | Discharge: 2022-09-12 | Disposition: A | Discharge status: Home / Self Care | Payer: Medicare PPO | Attending: Emergency Medicine | Admitting: Emergency Medicine

## 2022-09-11 ENCOUNTER — Telehealth: Payer: Self-pay | Admitting: Interventional Cardiology

## 2022-09-11 ENCOUNTER — Emergency Department (HOSPITAL_COMMUNITY): Payer: Medicare PPO

## 2022-09-11 DIAGNOSIS — R42 Dizziness and giddiness: Secondary | ICD-10-CM | POA: Diagnosis not present

## 2022-09-11 DIAGNOSIS — R0602 Shortness of breath: Secondary | ICD-10-CM | POA: Diagnosis not present

## 2022-09-11 DIAGNOSIS — Z7901 Long term (current) use of anticoagulants: Secondary | ICD-10-CM | POA: Diagnosis not present

## 2022-09-11 DIAGNOSIS — R0789 Other chest pain: Secondary | ICD-10-CM | POA: Diagnosis not present

## 2022-09-11 DIAGNOSIS — I2510 Atherosclerotic heart disease of native coronary artery without angina pectoris: Secondary | ICD-10-CM | POA: Diagnosis not present

## 2022-09-11 DIAGNOSIS — R002 Palpitations: Secondary | ICD-10-CM | POA: Diagnosis not present

## 2022-09-11 DIAGNOSIS — Z951 Presence of aortocoronary bypass graft: Secondary | ICD-10-CM | POA: Diagnosis not present

## 2022-09-11 DIAGNOSIS — I4891 Unspecified atrial fibrillation: Secondary | ICD-10-CM | POA: Diagnosis not present

## 2022-09-11 DIAGNOSIS — R079 Chest pain, unspecified: Secondary | ICD-10-CM | POA: Diagnosis not present

## 2022-09-11 DIAGNOSIS — I480 Paroxysmal atrial fibrillation: Secondary | ICD-10-CM | POA: Diagnosis not present

## 2022-09-11 LAB — CBC
HCT: 30.4 % — ABNORMAL LOW (ref 39.0–52.0)
Hemoglobin: 10.4 g/dL — ABNORMAL LOW (ref 13.0–17.0)
MCH: 34 pg (ref 26.0–34.0)
MCHC: 34.2 g/dL (ref 30.0–36.0)
MCV: 99.3 fL (ref 80.0–100.0)
Platelets: 115 10*3/uL — ABNORMAL LOW (ref 150–400)
RBC: 3.06 MIL/uL — ABNORMAL LOW (ref 4.22–5.81)
RDW: 14.2 % (ref 11.5–15.5)
WBC: 4.8 10*3/uL (ref 4.0–10.5)
nRBC: 0 % (ref 0.0–0.2)

## 2022-09-11 LAB — BASIC METABOLIC PANEL
Anion gap: 8 (ref 5–15)
BUN: 27 mg/dL — ABNORMAL HIGH (ref 8–23)
CO2: 23 mmol/L (ref 22–32)
Calcium: 8.9 mg/dL (ref 8.9–10.3)
Chloride: 110 mmol/L (ref 98–111)
Creatinine, Ser: 2.44 mg/dL — ABNORMAL HIGH (ref 0.61–1.24)
GFR, Estimated: 27 mL/min — ABNORMAL LOW (ref >60)
Glucose, Bld: 118 mg/dL — ABNORMAL HIGH (ref 70–99)
Potassium: 4.6 mmol/L (ref 3.5–5.1)
Sodium: 141 mmol/L (ref 135–145)

## 2022-09-11 LAB — TROPONIN I (HIGH SENSITIVITY)
Troponin I (High Sensitivity): 8 ng/L (ref <18)
Troponin I (High Sensitivity): 7 ng/L (ref <18)

## 2022-09-11 MED ORDER — AMIODARONE HCL 200 MG PO TABS
200.0000 mg | ORAL_TABLET | Freq: Once | ORAL | Status: AC
  Administered 2022-09-11: 200 mg via ORAL
  Filled 2022-09-11: qty 1

## 2022-09-11 MED ORDER — APIXABAN 5 MG PO TABS
5.0000 mg | ORAL_TABLET | Freq: Once | ORAL | Status: AC
  Administered 2022-09-11: 5 mg via ORAL
  Filled 2022-09-11: qty 1

## 2022-09-11 NOTE — Telephone Encounter (Signed)
Patient's wife calling to inform the patient is currently in the ED. She requests a call back.

## 2022-09-11 NOTE — Telephone Encounter (Signed)
Returned call to patient's wife Judson Roch (OK to speak to per [facility]).  Judson Roch reports patient went into a-fib this morning, she called EMS and patient is now at [facility] ED.  Judson Roch reports frustration with having to wait for admission since 9am this morning and is asking what she needs to do. She states patient has a medication he is supposed to take exactly at 7pm every night and she does not have the medication with her.   Lucile Crater to speak with ED staff to discuss estimated wait time and medication concern as he is currently in the care of the ED staff.   Sarah verbalized understanding and ended the call.

## 2022-09-11 NOTE — ED Provider Triage Note (Signed)
Emergency Medicine Provider Triage Evaluation Note  Erik Perez , a 76 y.o. male  was evaluated in triage.  Pt complains of palpitations, chest pain, lightheadedness that started around 730 this morning.  Has history of A-fib.  Takes amiodarone, Eliquis.  Last dose of both of those medicines was this morning.  Review of Systems  Positive: As above Negative: As above  Physical Exam  BP 131/81 (BP Location: Right Arm)   Pulse 74   Temp 98 F (36.7 C) (Oral)   Resp 16   SpO2 99%  Gen:   Awake, no distress   Resp:  Normal effort  MSK:   Moves extremities without difficulty  Other:  Normal rate.  Irregularly irregular rhythm  Medical Decision Making  Medically screening exam initiated at 11:51 AM.  Appropriate orders placed.  Erik Perez was informed that the remainder of the evaluation will be completed by another provider, this initial triage assessment does not replace that evaluation, and the importance of remaining in the ED until their evaluation is complete.     Evlyn Courier, PA-C 09/11/22 1152

## 2022-09-11 NOTE — ED Triage Notes (Signed)
EMS stated, pt had palpitations this morning with some chest pain with light headedness . Pt was getting out of shower and started when he got out. IV 18g left AC  Fluid bolus 500cc

## 2022-09-12 ENCOUNTER — Encounter (HOSPITAL_COMMUNITY): Payer: Self-pay

## 2022-09-12 ENCOUNTER — Other Ambulatory Visit: Payer: Self-pay

## 2022-09-12 MED ORDER — DILTIAZEM HCL 30 MG PO TABS: 30.0000 mg | ORAL_TABLET | Freq: Two times a day (BID) | ORAL | 0 refills | Status: DC | PRN

## 2022-09-12 NOTE — ED Notes (Signed)
AVS reviewed with pt and spouse prior to discharge. Pt and spouse verbalize understanding. Belongings with pt upon depart. Ambulatory to POV w/spouse.

## 2022-09-12 NOTE — ED Provider Notes (Signed)
[facility] EMERGENCY DEPARTMENT Provider Note   CSN: [id number] Arrival date & time: 09/11/22  1015     History  Chief Complaint  Patient presents with   Chest Pain   Palpitations   Dizziness    Erik Perez is a 76 y.o. male.  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  Patient states that the first time he had A-fib was a few months ago and he thought was a heart attack but he felt chest pain or shortness of breath.  He came in and found out he was in A-fib and had it 1 other time since then so he recognizes what that feels like.  He states he felt that way today and it was A-fib so he checked his heart rate and was in the 90s which worried him so he called EMS who brought him here for further evaluation.  During that episode he did feel the palpitations and shortness of breath that he usually does.  No lightheadedness, diaphoresis or other associated symptoms.  This started this morning and was not doing anything particularly strenuous had not really changed routine at all.  Take his medications as prescribed.  No other associated symptoms.   Chest Pain Associated symptoms: dizziness and palpitations   Palpitations Associated symptoms: chest pain and dizziness   Dizziness Associated symptoms: chest pain and palpitations        Home Medications Prior to Admission medications   Medication Sig Start Date End Date Taking? Authorizing Provider  diltiazem (CARDIZEM) 30 MG tablet Take 1 tablet (30 mg total) by mouth 2 (two) times daily as needed (symptomatic atrial fibrillation lasting longer than 5 minutes). 09/12/22  Yes Shriyan Arakawa, Corene Cornea, MD  allopurinol (ZYLOPRIM) 100 MG tablet Take 200 mg by mouth daily.    [provider]  amiodarone (PACERONE) 200 MG tablet Take 200 mg by  mouth 2 (two) times daily. Through Dec. 15th and then goes to once daily    [provider]  amLODipine (NORVASC) 5 MG tablet Take 5 mg by mouth 2 (two) times daily.    [provider]  apixaban (ELIQUIS) 5 MG TABS tablet Take 1 tablet (5 mg total) by mouth 2 (two) times daily. 07/17/22   Fenton, Clint R, PA  atorvastatin (LIPITOR) 40 MG tablet Take 40 mg by mouth daily.    [provider]  calcium gluconate 500 MG tablet Take 1 tablet by mouth 3 (three) times daily.    [provider]  Cholecalciferol (VITAMIN D) 2000 UNITS tablet Take 2,000 Units by mouth 2 (two) times daily.     [provider]  Cyanocobalamin (B-12 IJ) Take one (1) injection into the muscle once a month.    [provider]  doxazosin (CARDURA) 4 MG tablet Take 4 mg by mouth daily. 03/27/20   [provider]  fexofenadine (ALLEGRA) 180 MG tablet Take 180 mg by mouth daily.    [provider]  furosemide (LASIX) 20 MG tablet Take 20 mg by mouth as needed for fluid or edema.    [provider]  hydrochlorothiazide (HYDRODIURIL) 25 MG tablet Take 25 mg by mouth daily.    [provider]  lisinopril (PRINIVIL,ZESTRIL) 40 MG tablet Take 40 mg by mouth daily.    [provider]  metoprolol tartrate 75 MG TABS  Take 75 mg by mouth 2 (two) times daily. 08/14/22   Marylu Lund., NP  Multiple Vitamin (MULTIVITAMIN) tablet Take 1 tablet by mouth daily.    [provider]  nitroGLYCERIN (NITROSTAT) 0.4 MG SL tablet Place 1 tablet (0.4 mg total) under the tongue every 5 (five) minutes as needed for chest pain. 07/18/22   Belva Crome, MD  Omega-3 Fatty Acids (FISH OIL) 1200 MG CAPS Take 1,200 mg by mouth 2 (two) times daily.    [provider]  SILDENAFIL CITRATE PO Take 100 mg by mouth daily as needed for erectile dysfunction.    [provider]      Allergies    Colchicine, Diclofenac sodium, Gabapentin, and  Niaspan [niacin er]    Review of Systems   Review of Systems  Cardiovascular:  Positive for chest pain and palpitations.  Neurological:  Positive for dizziness.    Physical Exam Updated Vital Signs BP (!) 153/60   Pulse 63   Temp 97.8 F (36.6 C) (Oral)   Resp 12   Ht '5\' 11"'$  (1.803 m)   Wt 104.3 kg   SpO2 97%   BMI 32.08 kg/m  Physical Exam Vitals and nursing note reviewed.  Constitutional:      Appearance: He is well-developed.  HENT:     Head: Normocephalic and atraumatic.  Cardiovascular:     Rate and Rhythm: Normal rate.  Pulmonary:     Effort: Pulmonary effort is normal. No respiratory distress.  Chest:     Chest wall: No mass or tenderness.  Abdominal:     General: There is no distension.     Palpations: Abdomen is soft.  Musculoskeletal:        General: Normal range of motion.     Cervical back: Normal range of motion.  Skin:    General: Skin is warm and dry.  Neurological:     Mental Status: He is alert.     ED Results / Procedures / Treatments   Labs (all labs ordered are listed, but only abnormal results are displayed) Labs Reviewed  BASIC METABOLIC PANEL - Abnormal; Notable for the following components:      Result Value   Glucose, Bld 118 (*)    BUN 27 (*)    Creatinine, Ser 2.44 (*)    GFR, Estimated 27 (*)    All other components within normal limits  CBC - Abnormal; Notable for the following components:   RBC 3.06 (*)    Hemoglobin 10.4 (*)    HCT 30.4 (*)    Platelets 115 (*)    All other components within normal limits  TROPONIN I (HIGH SENSITIVITY)  TROPONIN I (HIGH SENSITIVITY)    EKG EKG Interpretation  Date/Time:  Thursday September 11 2022 11:46:58 EST Ventricular Rate:  85 PR Interval:    QRS Duration: 94 QT Interval:  384 QTC Calculation: 456 R Axis:   85 Text Interpretation: Atrial fibrillation Septal infarct , age undetermined Abnormal ECG When compared with ECG of 29-Aug-2022 09:31, PREVIOUS ECG IS PRESENT Confirmed  by Merrily Pew [phone]) on 09/12/2022 12:38:00 AM  Radiology DG Chest 2 View  Result Date: 09/11/2022 CLINICAL DATA:  Shortness of breath and atrial fibrillation EXAM: CHEST - 2 VIEW COMPARISON:  Chest radiograph dated 08/20/2022 FINDINGS: Normal lung volumes. No focal consolidations. No pleural effusion or pneumothorax. Similar postsurgical cardiomediastinal silhouette. Median sternotomy wires are nondisplaced. Cervical fixation hardware appears intact. IMPRESSION: No active cardiopulmonary disease. Electronically Signed  By: Darrin Nipper M.D.   On: 09/11/2022 12:19    Procedures Procedures    Medications Ordered in ED Medications  amiodarone (PACERONE) tablet 200 mg (200 mg Oral Given 09/11/22 2018)  apixaban (ELIQUIS) tablet 5 mg (5 mg Oral Given 09/11/22 2018)    ED Course/ Medical Decision Making/ A&P                           Medical Decision Making Amount and/or Complexity of Data Reviewed Labs: ordered. Radiology: ordered.  Risk Prescription drug management.  Overall patient appears well.  He is in sinus rhythm at this time.  I reviewed the EMS run sheets and also his earlier EKGs from when he got here over 14 hours ago.  He indeed was in A-fib but not rapid ventricular response.  I reviewed his medications and he is nearly maxed out on multiple medications.  However he still is hypertensive so wonder if we can add on a as needed diltiazem for when he goes into A-fib or possibly increasing his amiodarone a little bit.  He has an appointment with in the A-fib clinic coming up soon and then also electrophysiology appointment coming up shortly after that so if no changes can be made he should be fine to wait. Will d/w cards.   CXR done and showed no obvious abnormalities (independently viewed and interpreted by myself and radiology read reviewed).  Discussed case with cardiology who recommends as needed Cardizem and cardiology follow-up as scheduled.  Final Clinical  Impression(s) / ED Diagnoses Final diagnoses:  Paroxysmal atrial fibrillation ([place])    Rx / DC Orders ED Discharge Orders          Ordered    diltiazem (CARDIZEM) 30 MG tablet  2 times daily PRN        09/12/22 0138              Carola Viramontes, Corene Cornea, MD 09/12/22 0221

## 2022-09-16 DIAGNOSIS — U071 COVID-19: Secondary | ICD-10-CM | POA: Diagnosis not present

## 2022-09-21 ENCOUNTER — Emergency Department (HOSPITAL_COMMUNITY): Payer: Medicare PPO

## 2022-09-21 ENCOUNTER — Other Ambulatory Visit: Payer: Self-pay

## 2022-09-21 ENCOUNTER — Encounter (HOSPITAL_COMMUNITY): Payer: Self-pay | Admitting: Emergency Medicine

## 2022-09-21 ENCOUNTER — Inpatient Hospital Stay (HOSPITAL_COMMUNITY)
Admission: EM | Admit: 2022-09-21 | Discharge: 2022-09-29 | DRG: 417 | Disposition: A | Discharge status: Home Health | Payer: Medicare PPO | Attending: Internal Medicine | Admitting: Internal Medicine

## 2022-09-21 DIAGNOSIS — K8000 Calculus of gallbladder with acute cholecystitis without obstruction: Secondary | ICD-10-CM | POA: Diagnosis not present

## 2022-09-21 DIAGNOSIS — J1282 Pneumonia due to coronavirus disease 2019: Secondary | ICD-10-CM | POA: Diagnosis present

## 2022-09-21 DIAGNOSIS — Z951 Presence of aortocoronary bypass graft: Secondary | ICD-10-CM

## 2022-09-21 DIAGNOSIS — T40601A Poisoning by unspecified narcotics, accidental (unintentional), initial encounter: Secondary | ICD-10-CM | POA: Diagnosis not present

## 2022-09-21 DIAGNOSIS — I5032 Chronic diastolic (congestive) heart failure: Secondary | ICD-10-CM | POA: Diagnosis present

## 2022-09-21 DIAGNOSIS — R197 Diarrhea, unspecified: Secondary | ICD-10-CM | POA: Diagnosis not present

## 2022-09-21 DIAGNOSIS — N184 Chronic kidney disease, stage 4 (severe): Secondary | ICD-10-CM | POA: Diagnosis present

## 2022-09-21 DIAGNOSIS — U071 COVID-19: Secondary | ICD-10-CM | POA: Diagnosis present

## 2022-09-21 DIAGNOSIS — K82A1 Gangrene of gallbladder in cholecystitis: Secondary | ICD-10-CM | POA: Diagnosis not present

## 2022-09-21 DIAGNOSIS — N170 Acute kidney failure with tubular necrosis: Secondary | ICD-10-CM | POA: Diagnosis not present

## 2022-09-21 DIAGNOSIS — I2510 Atherosclerotic heart disease of native coronary artery without angina pectoris: Secondary | ICD-10-CM | POA: Diagnosis present

## 2022-09-21 DIAGNOSIS — K810 Acute cholecystitis: Secondary | ICD-10-CM | POA: Diagnosis not present

## 2022-09-21 DIAGNOSIS — D631 Anemia in chronic kidney disease: Secondary | ICD-10-CM | POA: Diagnosis present

## 2022-09-21 DIAGNOSIS — Z7901 Long term (current) use of anticoagulants: Secondary | ICD-10-CM

## 2022-09-21 DIAGNOSIS — N1411 Contrast-induced nephropathy: Secondary | ICD-10-CM | POA: Diagnosis not present

## 2022-09-21 DIAGNOSIS — E782 Mixed hyperlipidemia: Secondary | ICD-10-CM | POA: Diagnosis present

## 2022-09-21 DIAGNOSIS — M109 Gout, unspecified: Secondary | ICD-10-CM | POA: Diagnosis present

## 2022-09-21 DIAGNOSIS — D62 Acute posthemorrhagic anemia: Secondary | ICD-10-CM | POA: Diagnosis not present

## 2022-09-21 DIAGNOSIS — I739 Peripheral vascular disease, unspecified: Secondary | ICD-10-CM | POA: Diagnosis present

## 2022-09-21 DIAGNOSIS — I714 Abdominal aortic aneurysm, without rupture, unspecified: Secondary | ICD-10-CM | POA: Diagnosis present

## 2022-09-21 DIAGNOSIS — I130 Hypertensive heart and chronic kidney disease with heart failure and stage 1 through stage 4 chronic kidney disease, or unspecified chronic kidney disease: Secondary | ICD-10-CM | POA: Diagnosis not present

## 2022-09-21 DIAGNOSIS — K650 Generalized (acute) peritonitis: Secondary | ICD-10-CM | POA: Diagnosis not present

## 2022-09-21 DIAGNOSIS — K82A2 Perforation of gallbladder in cholecystitis: Secondary | ICD-10-CM | POA: Diagnosis present

## 2022-09-21 DIAGNOSIS — Z9989 Dependence on other enabling machines and devices: Secondary | ICD-10-CM | POA: Diagnosis not present

## 2022-09-21 DIAGNOSIS — Z8249 Family history of ischemic heart disease and other diseases of the circulatory system: Secondary | ICD-10-CM

## 2022-09-21 DIAGNOSIS — B961 Klebsiella pneumoniae [K. pneumoniae] as the cause of diseases classified elsewhere: Secondary | ICD-10-CM | POA: Diagnosis present

## 2022-09-21 DIAGNOSIS — E875 Hyperkalemia: Secondary | ICD-10-CM | POA: Diagnosis not present

## 2022-09-21 DIAGNOSIS — G928 Other toxic encephalopathy: Secondary | ICD-10-CM | POA: Diagnosis not present

## 2022-09-21 DIAGNOSIS — I480 Paroxysmal atrial fibrillation: Secondary | ICD-10-CM | POA: Diagnosis present

## 2022-09-21 DIAGNOSIS — R61 Generalized hyperhidrosis: Secondary | ICD-10-CM | POA: Diagnosis not present

## 2022-09-21 DIAGNOSIS — R0602 Shortness of breath: Secondary | ICD-10-CM | POA: Diagnosis not present

## 2022-09-21 DIAGNOSIS — I4891 Unspecified atrial fibrillation: Secondary | ICD-10-CM | POA: Diagnosis not present

## 2022-09-21 DIAGNOSIS — Z87891 Personal history of nicotine dependence: Secondary | ICD-10-CM | POA: Diagnosis not present

## 2022-09-21 DIAGNOSIS — D649 Anemia, unspecified: Secondary | ICD-10-CM | POA: Diagnosis not present

## 2022-09-21 DIAGNOSIS — I6521 Occlusion and stenosis of right carotid artery: Secondary | ICD-10-CM | POA: Diagnosis present

## 2022-09-21 DIAGNOSIS — I77810 Thoracic aortic ectasia: Secondary | ICD-10-CM | POA: Diagnosis not present

## 2022-09-21 DIAGNOSIS — E872 Acidosis, unspecified: Secondary | ICD-10-CM | POA: Diagnosis present

## 2022-09-21 DIAGNOSIS — G4733 Obstructive sleep apnea (adult) (pediatric): Secondary | ICD-10-CM | POA: Diagnosis not present

## 2022-09-21 DIAGNOSIS — N1832 Chronic kidney disease, stage 3b: Secondary | ICD-10-CM | POA: Diagnosis not present

## 2022-09-21 DIAGNOSIS — R188 Other ascites: Secondary | ICD-10-CM | POA: Diagnosis present

## 2022-09-21 DIAGNOSIS — N179 Acute kidney failure, unspecified: Secondary | ICD-10-CM | POA: Diagnosis not present

## 2022-09-21 DIAGNOSIS — N281 Cyst of kidney, acquired: Secondary | ICD-10-CM | POA: Diagnosis not present

## 2022-09-21 DIAGNOSIS — M25519 Pain in unspecified shoulder: Secondary | ICD-10-CM | POA: Diagnosis not present

## 2022-09-21 DIAGNOSIS — N189 Chronic kidney disease, unspecified: Secondary | ICD-10-CM

## 2022-09-21 DIAGNOSIS — T508X5A Adverse effect of diagnostic agents, initial encounter: Secondary | ICD-10-CM | POA: Diagnosis not present

## 2022-09-21 DIAGNOSIS — I700 Atherosclerosis of aorta: Secondary | ICD-10-CM | POA: Diagnosis not present

## 2022-09-21 DIAGNOSIS — Z79899 Other long term (current) drug therapy: Secondary | ICD-10-CM

## 2022-09-21 DIAGNOSIS — I129 Hypertensive chronic kidney disease with stage 1 through stage 4 chronic kidney disease, or unspecified chronic kidney disease: Secondary | ICD-10-CM | POA: Diagnosis not present

## 2022-09-21 DIAGNOSIS — I10 Essential (primary) hypertension: Secondary | ICD-10-CM | POA: Diagnosis present

## 2022-09-21 DIAGNOSIS — R1084 Generalized abdominal pain: Secondary | ICD-10-CM | POA: Diagnosis not present

## 2022-09-21 DIAGNOSIS — I25708 Atherosclerosis of coronary artery bypass graft(s), unspecified, with other forms of angina pectoris: Secondary | ICD-10-CM | POA: Diagnosis present

## 2022-09-21 LAB — COMPREHENSIVE METABOLIC PANEL
ALT: 16 U/L (ref 0–44)
AST: 17 U/L (ref 15–41)
Albumin: 2.6 g/dL — ABNORMAL LOW (ref 3.5–5.0)
Alkaline Phosphatase: 36 U/L — ABNORMAL LOW (ref 38–126)
Anion gap: 12 (ref 5–15)
BUN: 25 mg/dL — ABNORMAL HIGH (ref 8–23)
CO2: 21 mmol/L — ABNORMAL LOW (ref 22–32)
Calcium: 8.2 mg/dL — ABNORMAL LOW (ref 8.9–10.3)
Chloride: 99 mmol/L (ref 98–111)
Creatinine, Ser: 2.66 mg/dL — ABNORMAL HIGH (ref 0.61–1.24)
GFR, Estimated: 24 mL/min — ABNORMAL LOW (ref >60)
Glucose, Bld: 145 mg/dL — ABNORMAL HIGH (ref 70–99)
Potassium: 4.1 mmol/L (ref 3.5–5.1)
Sodium: 132 mmol/L — ABNORMAL LOW (ref 135–145)
Total Bilirubin: 1.2 mg/dL (ref 0.3–1.2)
Total Protein: 5.7 g/dL — ABNORMAL LOW (ref 6.5–8.1)

## 2022-09-21 LAB — I-STAT CHEM 8, ED
BUN: 28 mg/dL — ABNORMAL HIGH (ref 8–23)
Calcium, Ion: 1.17 mmol/L (ref 1.15–1.40)
Chloride: 102 mmol/L (ref 98–111)
Creatinine, Ser: 2.6 mg/dL — ABNORMAL HIGH (ref 0.61–1.24)
Glucose, Bld: 143 mg/dL — ABNORMAL HIGH (ref 70–99)
HCT: 30 % — ABNORMAL LOW (ref 39.0–52.0)
Hemoglobin: 10.2 g/dL — ABNORMAL LOW (ref 13.0–17.0)
Potassium: 4.2 mmol/L (ref 3.5–5.1)
Sodium: 135 mmol/L (ref 135–145)
TCO2: 25 mmol/L (ref 22–32)

## 2022-09-21 LAB — PROTIME-INR
INR: 1.9 — ABNORMAL HIGH (ref 0.8–1.2)
Prothrombin Time: 21.9 seconds — ABNORMAL HIGH (ref 11.4–15.2)

## 2022-09-21 LAB — CBC
HCT: 28.5 % — ABNORMAL LOW (ref 39.0–52.0)
Hemoglobin: 9.4 g/dL — ABNORMAL LOW (ref 13.0–17.0)
MCH: 33 pg (ref 26.0–34.0)
MCHC: 33 g/dL (ref 30.0–36.0)
MCV: 100 fL (ref 80.0–100.0)
Platelets: 157 10*3/uL (ref 150–400)
RBC: 2.85 MIL/uL — ABNORMAL LOW (ref 4.22–5.81)
RDW: 13.6 % (ref 11.5–15.5)
WBC: 4.5 10*3/uL (ref 4.0–10.5)
nRBC: 0 % (ref 0.0–0.2)

## 2022-09-21 LAB — LIPASE, BLOOD: Lipase: 50 U/L (ref 11–51)

## 2022-09-21 MED ORDER — ALLOPURINOL 100 MG PO TABS
100.0000 mg | ORAL_TABLET | Freq: Two times a day (BID) | ORAL | Status: DC
  Administered 2022-09-21 – 2022-09-29 (×17): 100 mg via ORAL
  Filled 2022-09-21 (×17): qty 1

## 2022-09-21 MED ORDER — PIPERACILLIN-TAZOBACTAM 3.375 G IVPB
3.3750 g | Freq: Three times a day (TID) | INTRAVENOUS | Status: DC
  Administered 2022-09-21 – 2022-09-23 (×6): 3.375 g via INTRAVENOUS
  Filled 2022-09-21 (×6): qty 50

## 2022-09-21 MED ORDER — CAMPHOR-MENTHOL 0.5-0.5 % EX LOTN: 1.0000 | TOPICAL_LOTION | Freq: Three times a day (TID) | CUTANEOUS | Status: DC | PRN

## 2022-09-21 MED ORDER — ONDANSETRON 4 MG PO TBDP: 4.0000 mg | ORAL_TABLET | Freq: Four times a day (QID) | ORAL | Status: DC | PRN

## 2022-09-21 MED ORDER — IOHEXOL 350 MG/ML SOLN
75.0000 mL | Freq: Once | INTRAVENOUS | Status: AC | PRN
  Administered 2022-09-21: 75 mL via INTRAVENOUS

## 2022-09-21 MED ORDER — AMIODARONE HCL 200 MG PO TABS
200.0000 mg | ORAL_TABLET | Freq: Every day | ORAL | Status: DC
  Administered 2022-09-21 – 2022-09-29 (×9): 200 mg via ORAL
  Filled 2022-09-21 (×9): qty 1

## 2022-09-21 MED ORDER — OXYCODONE HCL 5 MG PO TABS
5.0000 mg | ORAL_TABLET | ORAL | Status: DC | PRN
  Administered 2022-09-21 – 2022-09-22 (×5): 10 mg via ORAL
  Filled 2022-09-21 (×6): qty 2

## 2022-09-21 MED ORDER — DILTIAZEM HCL 30 MG PO TABS: 30.0000 mg | ORAL_TABLET | Freq: Two times a day (BID) | ORAL | Status: DC | PRN

## 2022-09-21 MED ORDER — DOXAZOSIN MESYLATE 4 MG PO TABS
4.0000 mg | ORAL_TABLET | Freq: Every day | ORAL | Status: DC
  Administered 2022-09-21 – 2022-09-29 (×9): 4 mg via ORAL
  Filled 2022-09-21 (×9): qty 1

## 2022-09-21 MED ORDER — ALBUTEROL SULFATE HFA 108 (90 BASE) MCG/ACT IN AERS
2.0000 | INHALATION_SPRAY | RESPIRATORY_TRACT | Status: DC | PRN
  Administered 2022-09-22 (×2): 2 via RESPIRATORY_TRACT
  Filled 2022-09-21: qty 6.7

## 2022-09-21 MED ORDER — ATORVASTATIN CALCIUM 40 MG PO TABS
40.0000 mg | ORAL_TABLET | Freq: Every day | ORAL | Status: DC
  Administered 2022-09-21 – 2022-09-29 (×9): 40 mg via ORAL
  Filled 2022-09-21 (×9): qty 1

## 2022-09-21 MED ORDER — LACTATED RINGERS IV BOLUS
500.0000 mL | Freq: Once | INTRAVENOUS | Status: AC
  Administered 2022-09-21: 500 mL via INTRAVENOUS

## 2022-09-21 MED ORDER — ACETAMINOPHEN 325 MG PO TABS
650.0000 mg | ORAL_TABLET | Freq: Four times a day (QID) | ORAL | Status: DC | PRN
  Administered 2022-09-23 (×2): 650 mg via ORAL
  Filled 2022-09-21 (×2): qty 2

## 2022-09-21 MED ORDER — LACTATED RINGERS IV SOLN: INTRAVENOUS | Status: DC

## 2022-09-21 MED ORDER — AMLODIPINE BESYLATE 5 MG PO TABS: 5.0000 mg | ORAL_TABLET | Freq: Two times a day (BID) | ORAL | Status: DC

## 2022-09-21 MED ORDER — PIPERACILLIN-TAZOBACTAM 3.375 G IVPB 30 MIN
3.3750 g | Freq: Once | INTRAVENOUS | Status: AC
  Administered 2022-09-21: 3.375 g via INTRAVENOUS
  Filled 2022-09-21: qty 50

## 2022-09-21 MED ORDER — HYDROXYZINE HCL 25 MG PO TABS: 25.0000 mg | ORAL_TABLET | Freq: Three times a day (TID) | ORAL | Status: DC | PRN

## 2022-09-21 MED ORDER — METOPROLOL TARTRATE 75 MG PO TABS: 75.0000 mg | ORAL_TABLET | Freq: Two times a day (BID) | ORAL | Status: DC

## 2022-09-21 MED ORDER — HYDROMORPHONE HCL 1 MG/ML IJ SOLN
0.5000 mg | INTRAMUSCULAR | Status: DC | PRN
  Administered 2022-09-21: 1 mg via INTRAVENOUS
  Filled 2022-09-21: qty 1

## 2022-09-21 MED ORDER — HYDRALAZINE HCL 20 MG/ML IJ SOLN: 10.0000 mg | INTRAMUSCULAR | Status: DC | PRN

## 2022-09-21 MED ORDER — ONDANSETRON HCL 4 MG/2ML IJ SOLN: 4.0000 mg | Freq: Four times a day (QID) | INTRAMUSCULAR | Status: DC | PRN

## 2022-09-21 MED ORDER — MORPHINE SULFATE (PF) 2 MG/ML IV SOLN
2.0000 mg | Freq: Once | INTRAVENOUS | Status: AC
  Administered 2022-09-21: 2 mg via INTRAVENOUS
  Filled 2022-09-21: qty 1

## 2022-09-21 MED ORDER — FENTANYL CITRATE PF 50 MCG/ML IJ SOSY
50.0000 ug | PREFILLED_SYRINGE | Freq: Once | INTRAMUSCULAR | Status: AC
  Administered 2022-09-21: 50 ug via INTRAVENOUS
  Filled 2022-09-21: qty 1

## 2022-09-21 MED ORDER — ACETAMINOPHEN 650 MG RE SUPP: 650.0000 mg | Freq: Four times a day (QID) | RECTAL | Status: DC | PRN

## 2022-09-21 MED ORDER — METOPROLOL TARTRATE 50 MG PO TABS
75.0000 mg | ORAL_TABLET | Freq: Two times a day (BID) | ORAL | Status: DC
  Administered 2022-09-21 – 2022-09-24 (×7): 75 mg via ORAL
  Filled 2022-09-21 (×7): qty 1

## 2022-09-21 MED ORDER — MOLNUPIRAVIR 200 MG PO CAPS
4.0000 | ORAL_CAPSULE | Freq: Two times a day (BID) | ORAL | Status: DC
  Administered 2022-09-21 – 2022-09-23 (×5): 800 mg via ORAL
  Filled 2022-09-21 (×4): qty 4

## 2022-09-21 NOTE — Progress Notes (Signed)
Pharmacy Antibiotic Note  Erik Perez is a 76 y.o. male admitted on 09/21/2022 with  abdominal pain .  SCr 2.66 and CrCl 28 ml/min. WBC 4.5 and afebrile. Patient was diagnosed with COVID on 12/19 outpatient and started on [facility] 12/20. CT positive for severe cholecystitis. Antibiotics started for concern of intra-abdominal infection. Pharmacy has been consulted to dose Zosyn.   Plan: Zosyn 3.375g IV q8h (4 hour infusion). Monitor for signs and symptoms of infection Follow for de-escalation    Temp (24hrs), Avg:98 F (36.7 C), Min:97.6 F (36.4 C), Max:98.3 F (36.8 C)  Recent Labs  Lab 09/21/22 0725 09/21/22 0810  WBC  --  4.5  CREATININE 2.60* 2.66*    Estimated Creatinine Clearance: 28.6 mL/min (A) (by C-G formula based on SCr of 2.66 mg/dL (H)).    Allergies  Allergen Reactions   Colchicine Other (See Comments)   Diclofenac Sodium     Chest Pain   Gabapentin     Increased sedation    Niaspan [Niacin Er]     Involuntary facial flushing    Antimicrobials this admission: Zosyn 12/24>>   Dose adjustments this admission:   Microbiology results:  Thank you for allowing pharmacy to be a part of this patient's care.  Jeneen Rinks 24/26/8341 96:22 AM

## 2022-09-21 NOTE — ED Provider Notes (Signed)
[facility] EMERGENCY DEPARTMENT Provider Note   CSN: [id number] Arrival date & time: 09/21/22  0710     History  Chief Complaint  Patient presents with   Abdominal Pain    Erik Perez is a 76 y.o. male.  HPI 76 year old male reported history of AAA, on Eliquis, presents today complaining of sudden severe onset of periumbilical pain that radiates bilaterally.  He reports a sudden onset this morning that woke him from sleep.  He endorses severe pain associated with this.  He has not had similar pain in the past.  EMS reports that pain does radiate to his right shoulder.  He reported diaphoretic on arrival.  He received No prehospital He began having symptoms of URI last Sunday and was diagnosed with COVID on Tuesday.  He has been immobile.  There day 4.  He continues to have some cough.     Home Medications Prior to Admission medications   Medication Sig Start Date End Date Taking? Authorizing Provider  allopurinol (ZYLOPRIM) 100 MG tablet Take 200 mg by mouth daily.    [provider]  amiodarone (PACERONE) 200 MG tablet Take 200 mg by mouth 2 (two) times daily. Through Dec. 15th and then goes to once daily    [provider]  amLODipine (NORVASC) 5 MG tablet Take 5 mg by mouth 2 (two) times daily.    [provider]  apixaban (ELIQUIS) 5 MG TABS tablet Take 1 tablet (5 mg total) by mouth 2 (two) times daily. 07/17/22   Fenton, Clint R, PA  atorvastatin (LIPITOR) 40 MG tablet Take 40 mg by mouth daily.    [provider]  calcium gluconate 500 MG tablet Take 1 tablet by mouth 3 (three) times daily.    [provider]  Cholecalciferol (VITAMIN D) 2000 UNITS tablet Take 2,000 Units by mouth 2 (two) times daily.     [provider]  Cyanocobalamin (B-12 IJ) Take one (1) injection into the muscle once a month.    [provider]  diltiazem (CARDIZEM) 30 MG tablet Take 1 tablet (30 mg total) by  mouth 2 (two) times daily as needed (symptomatic atrial fibrillation lasting longer than 5 minutes). 09/12/22   Mesner, Corene Cornea, MD  doxazosin (CARDURA) 4 MG tablet Take 4 mg by mouth daily. 03/27/20   [provider]  fexofenadine (ALLEGRA) 180 MG tablet Take 180 mg by mouth daily.    [provider]  furosemide (LASIX) 20 MG tablet Take 20 mg by mouth as needed for fluid or edema.    [provider]  hydrochlorothiazide (HYDRODIURIL) 25 MG tablet Take 25 mg by mouth daily.    [provider]  lisinopril (PRINIVIL,ZESTRIL) 40 MG tablet Take 40 mg by mouth daily.    [provider]  metoprolol tartrate 75 MG TABS Take 75 mg by mouth 2 (two) times daily. 08/14/22   Marylu Lund., NP  Multiple Vitamin (MULTIVITAMIN) tablet Take 1 tablet by mouth daily.    [provider]  nitroGLYCERIN (NITROSTAT) 0.4 MG SL tablet Place 1 tablet (0.4 mg total) under the tongue every 5 (five) minutes as needed for chest pain. 07/18/22   Belva Crome, MD  Omega-3 Fatty Acids (FISH OIL) 1200 MG CAPS Take 1,200 mg by mouth 2 (two) times daily.    [provider]  SILDENAFIL CITRATE PO Take 100 mg by mouth daily as needed for erectile dysfunction.    [provider]  Allergies    Colchicine, Diclofenac sodium, Gabapentin, and Niaspan [niacin er]    Review of Systems   Review of Systems  Physical Exam Updated Vital Signs BP (!) 150/57   Pulse 83   Temp 97.6 F (36.4 C) (Temporal)   Resp (!) 21   SpO2 99%  Physical Exam Vitals reviewed.  Constitutional:      Appearance: Normal appearance. He is ill-appearing and diaphoretic.  HENT:     Head: Normocephalic.     Right Ear: External ear normal.     Left Ear: External ear normal.     Nose: Nose normal.     Mouth/Throat:     Pharynx: Oropharynx is clear.  Eyes:     Extraocular Movements: Extraocular movements intact.     Pupils: Pupils are equal, round, and reactive to light.   Cardiovascular:     Rate and Rhythm: Normal rate and regular rhythm.  Pulmonary:     Effort: Pulmonary effort is normal.  Abdominal:     General: There is distension.     Tenderness: There is abdominal tenderness. There is guarding.  Musculoskeletal:        General: Normal range of motion.     Cervical back: Normal range of motion.  Skin:    General: Skin is warm.     Capillary Refill: Capillary refill takes less than 2 seconds.     Coloration: Skin is pale.  Neurological:     General: No focal deficit present.     Mental Status: He is alert.  Psychiatric:        Mood and Affect: Mood normal.        Behavior: Behavior normal.     ED Results / Procedures / Treatments   Labs (all labs ordered are listed, but only abnormal results are displayed) Labs Reviewed  CBC - Abnormal; Notable for the following components:      Result Value   RBC 2.85 (*)    Hemoglobin 9.4 (*)    HCT 28.5 (*)    All other components within normal limits  PROTIME-INR - Abnormal; Notable for the following components:   Prothrombin Time 21.9 (*)    INR 1.9 (*)    All other components within normal limits  I-STAT CHEM 8, ED - Abnormal; Notable for the following components:   BUN 28 (*)    Creatinine, Ser 2.60 (*)    Glucose, Bld 143 (*)    Hemoglobin 10.2 (*)    HCT 30.0 (*)    All other components within normal limits  COMPREHENSIVE METABOLIC PANEL  LIPASE, BLOOD  URINALYSIS, ROUTINE W REFLEX MICROSCOPIC  TYPE AND SCREEN    EKG EKG Interpretation  Date/Time:  Sunday September 21 2022 07:27:09 EST Ventricular Rate:  82 PR Interval:  165 QRS Duration: 102 QT Interval:  415 QTC Calculation: 485 R Axis:   95 Text Interpretation: Sinus rhythm Right axis deviation Anteroseptal infarct, old Confirmed by Pattricia Boss [phone]) on 09/21/2022 8:39:38 AM  Radiology CT Angio Chest/Abd/Pel for Dissection W and/or Wo Contrast  Result Date: 09/21/2022 CLINICAL DATA:  Rule out acute aortic syndrome.  Severe abdominal pain EXAM: CT ANGIOGRAPHY CHEST, ABDOMEN AND PELVIS TECHNIQUE: Non-contrast CT of the chest was initially obtained. Multidetector CT imaging through the chest, abdomen and pelvis was performed using the standard protocol during bolus administration of intravenous contrast. Multiplanar reconstructed images and MIPs were obtained and reviewed to evaluate the vascular anatomy. RADIATION DOSE REDUCTION: This exam was performed according to  the departmental dose-optimization program which includes automated exposure control, adjustment of the mA and/or kV according to patient size and/or use of iterative reconstruction technique. CONTRAST:  64m OMNIPAQUE IOHEXOL 350 MG/ML SOLN COMPARISON:  Abdominal aortic ultrasound 07/28/2018 FINDINGS: CTA CHEST FINDINGS Cardiovascular: Preferential opacification of the thoracic aorta. No evidence of thoracic aortic aneurysm or dissection. Normal heart size. No pericardial effusion. Aortic atherosclerosis. Previous CABG procedure. Mediastinum/Nodes: Thyroid gland, trachea appear normal. There is mild diffuse circumferential wall thickening of the esophagus which may reflect underlying esophagitis. No enlarged axillary, supraclavicular, mediastinal, or hilar lymph nodes. Lungs/Pleura: Trace pleural fluid identified bilaterally. Mild atelectasis noted in the posterior right base. No suspicious pulmonary nodule or mass. Musculoskeletal: No acute abnormality.  Previous median sternotomy. Review of the MIP images confirms the above findings. CTA ABDOMEN AND PELVIS FINDINGS VASCULAR Aorta: Infrarenal abdominal aortic aneurysm measures 3.7 cm, image 199/6. Aortic atherosclerotic calcifications. No signs of dissection. No significant stenosis. Celiac: Patent. 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  Renals: Both renal arteries are patent without evidence of aneurysm, dissection, vasculitis, fibromuscular dysplasia or significant stenosis. IMA: Patent without evidence of aneurysm, dissection, vasculitis or significant stenosis. Inflow: Patent without evidence of aneurysm, dissection, vasculitis or significant stenosis. Veins: No obvious venous abnormality within the limitations of this arterial phase study. Review of the MIP images confirms the above findings. NON-VASCULAR Hepatobiliary: Liver cysts noted.  No suspicious liver lesion. There are multiple stones within the gallbladder which measure up to 1.5 cm. There is diffuse gallbladder wall thickening and pericholecystic inflammation. Pericholecystic fluid is identified and there is fluid extending along the right pericolic gutter. Fluid measures scratch set the fluid is mildly complex measuring 19 Hounsfield units, image 275/6. No signs of bile duct dilatation. Pancreas: Unremarkable. No pancreatic ductal dilatation or surrounding inflammatory changes. Spleen: Normal in size without focal abnormality. Adrenals/Urinary Tract: There is a nodule in the right adrenal gland measuring 2 cm and 1.5 Hounsfield units. This is compatible with a benign adenoma. No follow-up imaging recommended. Bilateral kidney cysts are identified. Arising off the inferior pole of the left kidney is an exophytic lesion measuring 1.7 cm and 85 Hounsfield units. This does not meet CT criteria for benign kidney lesion. No signs of nephrolithiasis or hydronephrosis. Urinary bladder is unremarkable. Stomach/Bowel: Stomach is normal. There is wall thickening involving the descending portion of the duodenum adjacent to the gallbladder. The appendix is visualized and appears normal. No pathologic dilatation of the large or small bowel loops. Lymphatic: No enlarged abdominopelvic adenopathy. Reproductive: Moderate prostate gland enlargement. Other: No discrete fluid collection identified. No  signs of pneumoperitoneum. Musculoskeletal: No acute or significant osseous findings. Review of the MIP images confirms the above findings. IMPRESSION: 1. No evidence for thoracic or abdominal aortic dissection. 2. Gallstones with diffuse gallbladder wall thickening and pericholecystic inflammation. Findings are concerning for acute cholecystitis. There is small volume of free fluid which appears mildly complex measuring 19 Hounsfield units extending from the gallbladder along the right pericolic gutter and along bilateral peritoneal reflections within the lower abdomen. 3. Wall thickening involving the descending portion of the duodenum adjacent to the gallbladder is likely reactive in the setting of acute cholecystitis. 4. Infrarenal abdominal aortic aneurysm measures 3.7 cm. Recommend follow-up ultrasound every 2 years. This recommendation follows ACR consensus guidelines: White Paper of the ACR Incidental Findings Committee II on Vascular  Findings. J Am Coll Radiol 2013; 10:789-794. 5. Trace pleural fluid bilaterally. 6. Indeterminate exophytic lesion arising off the inferior pole of the left kidney. This may represent a hemorrhagic cyst or enhancing kidney lesion. This does not require emergent attention. When the patient is clinically stable and able to follow directions and hold their breath (preferably as an outpatient) further evaluation with dedicated abdominal MRI should be considered. 7.  Aortic Atherosclerosis (ICD10-I70.0). Electronically Signed   By: Kerby Moors M.D.   On: 09/21/2022 08:15    Procedures .Critical Care  Performed by: Pattricia Boss, MD Authorized by: Pattricia Boss, MD   Critical care provider statement:    Critical care time (minutes):  45   Critical care was time spent personally by me on the following activities:  Development of treatment plan with patient or surrogate, discussions with consultants, evaluation of patient's response to treatment, examination of patient,  ordering and review of laboratory studies, ordering and review of radiographic studies, ordering and performing treatments and interventions, pulse oximetry, re-evaluation of patient's condition and review of old charts     Medications Ordered in ED Medications  piperacillin-tazobactam (ZOSYN) IVPB 3.375 g (3.375 g Intravenous New Bag/Given 09/21/22 0848)  morphine (PF) 2 MG/ML injection 2 mg (has no administration in time range)  iohexol (OMNIPAQUE) 350 MG/ML injection 75 mL (75 mLs Intravenous Contrast Given 09/21/22 0751)  fentaNYL (SUBLIMAZE) injection 50 mcg (50 mcg Intravenous Given 09/21/22 0814)  lactated ringers bolus 500 mL (0 mLs Intravenous Stopped 09/21/22 0848)    ED Course/ Medical Decision Making/ A&P Clinical Course as of 09/21/22 0917  Sun Sep 21, 2022  0914 CBC reviewed with normal white blood cell count of 4500 and anemia with hemoglobin of 9.4 Hemoglobin trending down with hemoglobin of 10 several weeks ago and previously 12 unclear etiology will need further evaluation [DR]    Clinical Course User Index [DR] Pattricia Boss, MD                           Medical Decision Making 76 year old male history of AAA, A-fib, on Eliquis presents today with sudden severe onset of upper abdominal pain with associated nausea.  Pain was severe.  Patient seen and evaluated with differential diagnosis that is broad and includes but is not limited to rupture of abdominal aortic aneurysm, dissection of end of aorta, other diseases of the abdomen including gastritis, peptic ulcer disease, rupture bowel, cholecystitis, small bowel obstruction Patient initiated as code medical and patient taken to scanner without labs returned although creatinine had return from i-STAT.  It was noted to be elevated and on review has continued to be elevated.  Due to potential severity of source of pain, we proceeded with CT with contrast as rupture of his aneurysm would be life-threatening. CT shows no  evidence of rupture or dissection There are gallstones with diffuse gallbladder wall thickening and pericholecystic inflammation consistent with acute cholecystitis There is additional wall thickening in the descending portion of the duodenum adjacent to the gallbladder likely due to reaction from the cholecystitis Infrarenal abdominal aortic aneurysm measures 3.7 cm There is trace pleural fluid bilaterally An exophytic lesion of the inferior pole of the left kidney is noted Care discussed with Dr. Thermon Leyland , on-call for general surgery 1-abdominal pain with changes on CT consistent with cholecystitis Patient treated here with Zosyn and pain medication 2 CKD with elevated creatinine today at 2.6 first elevated that I note was 2.11 in  July 2023 and has been up and down over this fall.  However prior to that there was a large gap in time and is unclear how long he has had elevated creatinine He has been given IV fluids due to patient receiving contrast.  Patient reports that his doctors have been telling him that his kidney function is stage III and has been stable.  He has been followed by Dr. Posey Pronto with [facility] patient has been diagnosed with COVID this past week and is on day 4 of molnupiravir 4 patient has history of A-fib and is on Eliquis with last dose taken last night 5 anemia hemoglobin 9.4 trending down unclear if this is GI in etiology.  Patient is also on Eliquis Will need further evaluation but appears hemodynamically stable here in the ED  Amount and/or Complexity of Data Reviewed Labs: ordered. Decision-making details documented in ED Course. Radiology: ordered and independent interpretation performed. Decision-making details documented in ED Course. ECG/medicine tests: ordered and independent interpretation performed. Decision-making details documented in ED Course.    Details: Patient maintained on monitor has remained hemodynamically stable Discussion of management  or test interpretation with external provider(s): Care discussed with Dr. Ebony Hail from general surgery who will see for evaluation Care discussed with Dr. Lorin Mercy on-call for hospitalist who will see for admission  Risk Prescription drug management. Parenteral controlled substances. Decision regarding hospitalization.           Final Clinical Impression(s) / ED Diagnoses Final diagnoses:  Acute cholecystitis  Chronic kidney disease, unspecified CKD stage  Chronic anticoagulation  COVID    Rx / DC Orders ED Discharge Orders     None         Pattricia Boss, MD 09/21/22 [phone]

## 2022-09-21 NOTE — Consult Note (Signed)
Consulting Physician: Nickola Major Jakiah Bienaime  Referring Provider: Dr. Jeanell Sparrow - ER Physician  Chief Complaint: Abdominal pain  Reason for Consult: Cholecystitis   Subjective   HPI: Erik Perez is an 76 y.o. male who is here for abdominal pain.  He developed severe abdominal pain last night.  He has a history of a small abdominal aortic aneurysm and was concerned his aneurysm ruptured.  He presented to the emergency room.  The pain continues despite multiple pain medications.  CT scan was negative for any aortic issues, but positive for severe acute cholecystitis.  He took his Eliquis last night at 6:30 PM.  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 Taxus stent 2002    Family History  Problem Relation Age of Onset   Heart disease Mother    Heart disease Father    Heart disease Brother     Social:  reports that he quit smoking about 39 years ago. His smoking use included cigarettes. He started smoking about 62 years ago. He has a 7.59 pack-year smoking history. He has never used smokeless tobacco. He reports current alcohol use. He reports that he does not use drugs.  Allergies:  Allergies  Allergen Reactions   Colchicine Other (See Comments)   Diclofenac Sodium     Chest Pain   Gabapentin     Increased sedation    Niaspan [Niacin Er]     Involuntary facial flushing    Medications: Current Outpatient Medications  Medication Instructions   allopurinol (ZYLOPRIM) 100 mg, Oral, 2 times daily   amiodarone (PACERONE) 200 mg, Oral, 2 times daily, Through Dec. 15th and  then goes to once daily   amLODipine (NORVASC) 5 mg, Oral, 2 times daily   apixaban (ELIQUIS) 5 mg, Oral, 2 times daily   atorvastatin (LIPITOR) 40 mg, Oral, Daily   calcium gluconate 500 MG tablet 1 tablet, Oral, 3 times daily   Cyanocobalamin (B-12 IJ) Take one (1) injection into the muscle once a month.    diltiazem (CARDIZEM) 30 mg, Oral, 2 times daily PRN   doxazosin (CARDURA) 4 mg, Oral, Daily   fexofenadine (ALLEGRA) 180 mg, Oral, Daily   Fish Oil 1,200 mg, Oral, 2 times daily   furosemide (LASIX) 20 mg, Oral, As needed   hydrochlorothiazide (HYDRODIURIL) 12.5 mg, Oral, Daily   LAGEVRIO 200 MG CAPS capsule 4 capsules, Oral, 2 times daily   lisinopril (ZESTRIL) 40 mg, Oral, Daily   Metoprolol Tartrate 75 mg, Oral, 2 times daily   Multiple Vitamin (MULTIVITAMIN) tablet 1 tablet, Oral, Daily   nitroGLYCERIN (NITROSTAT) 0.4 mg, Sublingual, Every 5 min PRN   SILDENAFIL CITRATE PO 100 mg, Oral, Daily PRN   Vitamin D 2,000 Units, Oral, 2 times daily    ROS - all of the below systems have been reviewed with the patient and positives are indicated with bold text General: chills, fever or night sweats Eyes: blurry vision or double vision ENT: epistaxis or sore throat Allergy/Immunology: itchy/watery eyes or nasal congestion Hematologic/Lymphatic:  bleeding problems, blood clots or swollen lymph nodes Endocrine: temperature intolerance or unexpected weight changes Breast: new or changing breast lumps or nipple discharge Resp: cough, shortness of breath, or wheezing CV: chest pain or dyspnea on exertion GI: as per HPI GU: dysuria, trouble voiding, or hematuria MSK: joint pain or joint stiffness Neuro: TIA or stroke symptoms Derm: pruritus and skin lesion changes Psych: anxiety and depression  Objective   PE Blood pressure (!) 150/57, pulse 83, temperature 97.6 F (36.4 C), temperature source Temporal, resp. rate (!) 21, SpO2 99 %. Constitutional: NAD; conversant; no  deformities Eyes: Moist conjunctiva; no lid lag; anicteric; PERRL Neck: Trachea midline; no thyromegaly Lungs: Normal respiratory effort; no tactile fremitus CV: RRR; no palpable thrills; no pitting edema GI: Abd severe tenderness right upper quadrant, distended; no palpable hepatosplenomegaly MSK: Normal range of motion of extremities; no clubbing/cyanosis Psychiatric: Appropriate affect; alert and oriented x3 Lymphatic: No palpable cervical or axillary lymphadenopathy  Results for orders placed or performed during the hospital encounter of 09/21/22 (from the past 24 hour(s))  I-stat chem 8, ED (not at [facility], [facility] or [place])     Status: Abnormal   Collection Time: 09/21/22  7:25 AM  Result Value Ref Range   Sodium 135 135 - 145 mmol/L   Potassium 4.2 3.5 - 5.1 mmol/L   Chloride 102 98 - 111 mmol/L   BUN 28 (H) 8 - 23 mg/dL   Creatinine, Ser 2.60 (H) 0.61 - 1.24 mg/dL   Glucose, Bld 143 (H) 70 - 99 mg/dL   Calcium, Ion 1.17 1.15 - 1.40 mmol/L   TCO2 25 22 - 32 mmol/L   Hemoglobin 10.2 (L) 13.0 - 17.0 g/dL   HCT 30.0 (L) 39.0 - 52.0 %  CBC     Status: Abnormal   Collection Time: 09/21/22  8:10 AM  Result Value Ref Range   WBC 4.5 4.0 - 10.5 K/uL   RBC 2.85 (L) 4.22 - 5.81 MIL/uL   Hemoglobin 9.4 (L) 13.0 - 17.0 g/dL   HCT 28.5 (L) 39.0 - 52.0 %   MCV 100.0 80.0 - 100.0 fL   MCH 33.0 26.0 - 34.0 pg   MCHC 33.0 30.0 - 36.0 g/dL   RDW 13.6 11.5 - 15.5 %   Platelets 157 150 - 400 K/uL   nRBC 0.0 0.0 - 0.2 %  Comprehensive metabolic panel     Status: Abnormal   Collection Time: 09/21/22  8:10 AM  Result Value Ref Range   Sodium 132 (L) 135 - 145 mmol/L   Potassium 4.1 3.5 - 5.1 mmol/L   Chloride 99 98 - 111 mmol/L   CO2 21 (L) 22 - 32 mmol/L   Glucose, Bld 145 (H) 70 - 99 mg/dL   BUN 25 (H) 8 - 23 mg/dL   Creatinine, Ser 2.66 (H) 0.61 - 1.24 mg/dL   Calcium 8.2 (L) 8.9 - 10.3 mg/dL   Total Protein 5.7 (L) 6.5 - 8.1 g/dL   Albumin 2.6 (L) 3.5 - 5.0 g/dL   AST 17 15 - 41 U/L    ALT 16 0 - 44 U/L   Alkaline Phosphatase 36 (L) 38 - 126 U/L   Total Bilirubin 1.2 0.3 - 1.2 mg/dL   GFR, Estimated 24 (L) >60 mL/min   Anion gap 12 5 - 15  Lipase, blood     Status: None   Collection Time: 09/21/22  8:10 AM  Result Value Ref Range   Lipase 50 11 - 51 U/L  Protime-INR     Status: Abnormal   Collection Time: 09/21/22  8:10 AM  Result Value Ref Range   Prothrombin Time 21.9 (H) 11.4 - 15.2 seconds   INR 1.9 (H) 0.8 - 1.2  Type and screen [place]     Status: None   Collection Time: 09/21/22  8:10 AM  Result Value Ref Range   ABO/RH(D) A POS    Antibody Screen NEG    Sample Expiration      09/24/2022,2359 Performed at [facility], [place] [address] [place], [place] [address]      Imaging Orders         CT Angio Chest/Abd/Pel for Dissection W and/or Wo Contrast      Assessment and Plan   Erik Perez is an 76 y.o. male with acute cholecystitis.  I recommended laparoscopic cholecystectomy.  The patient took Eliquis last night, so I recommend waiting 48 hours prior to surgery.  We will tentatively plan on surgery on Tuesday morning.  We discussed the procedure of laparoscopic cholecystectomy.  We discussed the procedure itself as well as its risk, benefits, and alternatives.  After full discussion all questions answered the patient granted consent to proceed.  I discussed this with his eldest son on the phone and his wife in the room.  The patient is being admitted to the medicine service who can perform a preoperative medical evaluation to ensure there is no other medical issues in need of optimization prior to general anesthesia.    ICD-10-CM   1. Acute cholecystitis  K81.0     2. Chronic kidney disease, unspecified CKD stage  N18.9     3. Chronic anticoagulation  Z79.01     4. COVID  U07.1        Felicie Morn, [facility] Use [email or web address] to contact on call provider  New Patient Billing: [phone]  - High MDM

## 2022-09-21 NOTE — H&P (Signed)
History and Physical    Patient: Erik Perez PTW:[id number] DOB: 22-May-1946 DOA: 09/21/2022 DOS: the patient was seen and examined on 09/21/2022 PCP: Lurline Del, DO  Patient coming from: Home - lives with wife; NOK: Wife, Theotis Gerdeman, [phone]   Chief Complaint: Abdominal pain  HPI: Erik Perez is a 76 y.o. male with medical history significant of CAD, AAA, CKD, afib on Eliquis (last dose night), carotid stenosis, HTN, HLD, and OSA on CPAP presenting with abdominal pain. Patient reports that he has had mild SOB and fatigue since diagnosed with afib in October.  He became acutely ill with cough, congestion and was diagnosed with COVID on 12/20, started treatment with molnupiravir that day (completing 5 days today).  He also had a known AAA.  He started about 0600 with acute R-sided abdominal pain that radiated to his R shoulder.  The pain later spread across his mid abdomen to both flanks.  He has been having some abdominal discomfort after eating recently but thought it may be related to above.  His last dose of Eliquis was last night.  He does not wear home O2 but is on qhs CPAP.    ER Course:  AAA, severe abdominal pain, acute cholecystitis.  Creatinine 2.6, followed by nephrology, stable?  Also with COVID, symptoms last Sunday on day 4 of molnupiravir.  Given Zosyn, LR x 500 cc.  Surgery will see.  Eliquis, last dose last night.       Review of Systems: As mentioned in the history of present illness. All other systems reviewed and are negative. Past Medical History:  Diagnosis Date   Carotid artery obstruction    Bilateral moderate carotid obstruction 2013 doppler   Coronary artery disease    Hyperlipidemia    Hypertension    OSA (obstructive sleep apnea)    Split 02-08-07 AHI total 32/hr; REM 60/hr O2 sat min NREM 88% REM 88%   Past Surgical History:  Procedure Laterality Date   CERVICAL DISC SURGERY  2009   CORONARY ARTERY BYPASS GRAFT     [place], to  LAD, RIMA to RCA 2000,Cfx. Taxus stent 2002   Social History:  reports that he quit smoking about 39 years ago. His smoking use included cigarettes. He started smoking about 62 years ago. He has a 7.59 pack-year smoking history. He has never used smokeless tobacco. He reports current alcohol use. He reports that he does not use drugs.  Allergies  Allergen Reactions   Colchicine Other (See Comments)   Diclofenac Sodium     Chest Pain   Gabapentin     Increased sedation    Niaspan [Niacin Er]     Involuntary facial flushing    Family History  Problem Relation Age of Onset   Heart disease Mother    Heart disease Father    Heart disease Brother     Prior to Admission medications   Medication Sig Start Date End Date Taking? Authorizing Provider  allopurinol (ZYLOPRIM) 100 MG tablet Take 200 mg by mouth daily.    [provider]  amiodarone (PACERONE) 200 MG tablet Take 200 mg by mouth 2 (two) times daily. Through Dec. 15th and then goes to once daily    [provider]  amLODipine (NORVASC) 5 MG tablet Take 5 mg by mouth 2 (two) times daily.    [provider]  apixaban (ELIQUIS) 5 MG TABS tablet Take 1 tablet (5 mg total) by mouth 2 (two) times daily. 07/17/22   Fenton,  Clint R, PA  atorvastatin (LIPITOR) 40 MG tablet Take 40 mg by mouth daily.    [provider]  calcium gluconate 500 MG tablet Take 1 tablet by mouth 3 (three) times daily.    [provider]  Cholecalciferol (VITAMIN D) 2000 UNITS tablet Take 2,000 Units by mouth 2 (two) times daily.     [provider]  Cyanocobalamin (B-12 IJ) Take one (1) injection into the muscle once a month.    [provider]  diltiazem (CARDIZEM) 30 MG tablet Take 1 tablet (30 mg total) by mouth 2 (two) times daily as needed (symptomatic atrial fibrillation lasting longer than 5 minutes). 09/12/22   Mesner, Corene Cornea, MD  doxazosin (CARDURA) 4 MG tablet Take 4 mg by mouth daily. 03/27/20    [provider]  fexofenadine (ALLEGRA) 180 MG tablet Take 180 mg by mouth daily.    [provider]  furosemide (LASIX) 20 MG tablet Take 20 mg by mouth as needed for fluid or edema.    [provider]  hydrochlorothiazide (HYDRODIURIL) 25 MG tablet Take 25 mg by mouth daily.    [provider]  lisinopril (PRINIVIL,ZESTRIL) 40 MG tablet Take 40 mg by mouth daily.    [provider]  metoprolol tartrate 75 MG TABS Take 75 mg by mouth 2 (two) times daily. 08/14/22   Marylu Lund., NP  Multiple Vitamin (MULTIVITAMIN) tablet Take 1 tablet by mouth daily.    [provider]  nitroGLYCERIN (NITROSTAT) 0.4 MG SL tablet Place 1 tablet (0.4 mg total) under the tongue every 5 (five) minutes as needed for chest pain. 07/18/22   Belva Crome, MD  Omega-3 Fatty Acids (FISH OIL) 1200 MG CAPS Take 1,200 mg by mouth 2 (two) times daily.    [provider]  SILDENAFIL CITRATE PO Take 100 mg by mouth daily as needed for erectile dysfunction.    [provider]    Physical Exam: Vitals:   09/21/22 0815 09/21/22 0830 09/21/22 0846 09/21/22 1022  BP: (!) 160/57 (!) 150/57  (!) 147/57  Pulse: 82 83  85  Resp: (!) 21 (!) 21  17  Temp:   97.6 F (36.4 C) 98.3 F (36.8 C)  TempSrc:   Temporal Oral  SpO2: 99% 99%  95%   General:  Appears ill, uncomfortable, SOB with persistent conversation despite 2L [place] O2 Eyes:   EOMI, normal lids, iris ENT:  grossly normal hearing, lips & tongue, mildly dry mm; appropriate dentition Neck:  no LAD, masses or thyromegaly Cardiovascular:  RRR, no m/r/g. 1+ LE edema.  Respiratory:   Scattered rhonchi.  Mildly increased respiratory effort with conversational dyspnea despite 2L [place] O2. Abdomen:  diffusely TTP without frank peritoneal signs Skin:  no rash or induration seen on limited exam Musculoskeletal:  grossly normal tone BUE/BLE, good ROM, no bony abnormality Psychiatric:  blunted mood and  affect, speech fluent and appropriate, AOx3 Neurologic:  CN 2-12 grossly intact, moves all extremities in coordinated fashion   Radiological Exams on Admission: Independently reviewed - see discussion in A/P where applicable  CT Angio Chest/Abd/Pel for Dissection W and/or Wo Contrast  Result Date: 09/21/2022 CLINICAL DATA:  Rule out acute aortic syndrome. Severe abdominal pain EXAM: CT ANGIOGRAPHY CHEST, ABDOMEN AND PELVIS TECHNIQUE: Non-contrast CT of the chest was initially obtained. Multidetector CT imaging through the chest, abdomen and pelvis was performed using the standard protocol during bolus administration of intravenous contrast. Multiplanar reconstructed images and MIPs were obtained  and reviewed to evaluate the vascular anatomy. RADIATION DOSE REDUCTION: This exam was performed according to the departmental dose-optimization program which includes automated exposure control, adjustment of the mA and/or kV according to patient size and/or use of iterative reconstruction technique. CONTRAST:  65m OMNIPAQUE IOHEXOL 350 MG/ML SOLN COMPARISON:  Abdominal aortic ultrasound 07/28/2018 FINDINGS: CTA CHEST FINDINGS Cardiovascular: Preferential opacification of the thoracic aorta. No evidence of thoracic aortic aneurysm or dissection. Normal heart size. No pericardial effusion. Aortic atherosclerosis. Previous CABG procedure. Mediastinum/Nodes: Thyroid gland, trachea appear normal. There is mild diffuse circumferential wall thickening of the esophagus which may reflect underlying esophagitis. No enlarged axillary, supraclavicular, mediastinal, or hilar lymph nodes. Lungs/Pleura: Trace pleural fluid identified bilaterally. Mild atelectasis noted in the posterior right base. No suspicious pulmonary nodule or mass. Musculoskeletal: No acute abnormality.  Previous median sternotomy. Review of the MIP images confirms the above findings. CTA ABDOMEN AND PELVIS FINDINGS VASCULAR Aorta: Infrarenal abdominal  aortic aneurysm measures 3.7 cm, image 199/6. Aortic atherosclerotic calcifications. No signs of dissection. No significant stenosis. Celiac: Patent. 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 Renals: Both renal arteries are patent without evidence of aneurysm, dissection, vasculitis, fibromuscular dysplasia or significant stenosis. IMA: Patent without evidence of aneurysm, dissection, vasculitis or significant stenosis. Inflow: Patent without evidence of aneurysm, dissection, vasculitis or significant stenosis. Veins: No obvious venous abnormality within the limitations of this arterial phase study. Review of the MIP images confirms the above findings. NON-VASCULAR Hepatobiliary: Liver cysts noted.  No suspicious liver lesion. There are multiple stones within the gallbladder which measure up to 1.5 cm. There is diffuse gallbladder wall thickening and pericholecystic inflammation. Pericholecystic fluid is identified and there is fluid extending along the right pericolic gutter. Fluid measures scratch set the fluid is mildly complex measuring 19 Hounsfield units, image 275/6. No signs of bile duct dilatation. Pancreas: Unremarkable. No pancreatic ductal dilatation or surrounding inflammatory changes. Spleen: Normal in size without focal abnormality. Adrenals/Urinary Tract: There is a nodule in the right adrenal gland measuring 2 cm and 1.5 Hounsfield units. This is compatible with a benign adenoma. No follow-up imaging recommended. Bilateral kidney cysts are identified. Arising off the inferior pole of the left kidney is an exophytic lesion measuring 1.7 cm and 85 Hounsfield units. This does not meet CT criteria for benign kidney lesion. No signs of nephrolithiasis or hydronephrosis. Urinary bladder is unremarkable. Stomach/Bowel: Stomach is  normal. There is wall thickening involving the descending portion of the duodenum adjacent to the gallbladder. The appendix is visualized and appears normal. No pathologic dilatation of the large or small bowel loops. Lymphatic: No enlarged abdominopelvic adenopathy. Reproductive: Moderate prostate gland enlargement. Other: No discrete fluid collection identified. No signs of pneumoperitoneum. Musculoskeletal: No acute or significant osseous findings. Review of the MIP images confirms the above findings. IMPRESSION: 1. No evidence for thoracic or abdominal aortic dissection. 2. Gallstones with diffuse gallbladder wall thickening and pericholecystic inflammation. Findings are concerning for acute cholecystitis. There is small volume of free fluid which appears mildly complex measuring 19 Hounsfield units extending from the gallbladder along the right pericolic gutter and along bilateral peritoneal reflections within the lower abdomen. 3. Wall thickening involving the descending portion of the duodenum adjacent to the gallbladder is likely reactive in the setting of acute cholecystitis. 4. Infrarenal abdominal aortic aneurysm measures 3.7 cm. Recommend follow-up ultrasound every 2 years. This  recommendation follows ACR consensus guidelines: White Paper of the ACR Incidental Findings Committee II on Vascular Findings. J Am Coll Radiol 2013; 10:789-794. 5. Trace pleural fluid bilaterally. 6. Indeterminate exophytic lesion arising off the inferior pole of the left kidney. This may represent a hemorrhagic cyst or enhancing kidney lesion. This does not require emergent attention. When the patient is clinically stable and able to follow directions and hold their breath (preferably as an outpatient) further evaluation with dedicated abdominal MRI should be considered. 7.  Aortic Atherosclerosis (ICD10-I70.0). Electronically Signed   By: Kerby Moors M.D.   On: 09/21/2022 08:15    EKG: Independently reviewed.  NSR with  rate 81; nonspecific ST changes with no evidence of acute ischemia   Labs on Admission: I have personally reviewed the available labs and imaging studies at the time of the admission.  Pertinent labs:    Na++ 132 Glucose 145 BUN 25/Creatinine 2.66/GFR 24 - stable Albumin 2.6 WBC 4.5 Hgb 9.4; 10.4 on 12/14 INR 1.9   Assessment and Plan: Principal Problem:   Acute cholecystitis Active Problems:   Essential hypertension, benign   Mixed hyperlipidemia   Paroxysmal atrial fibrillation (HCC)   Coronary artery disease of bypass graft of native heart with stable angina pectoris ([place])   COVID-19 virus infection   CKD (chronic kidney disease) stage 4, GFR 15-29 ml/min (HCC)   AAA (abdominal aortic aneurysm) without rupture (HCC)   OSA on CPAP    Acute cholecystitis -Patient with recent post-prandial symptoms presenting with severe overnight RUQ/midepigastric pain with radiation to R shoulder -CTA with gallstones with diffuse GB wall thickening and pericholecystic inflammation, concerning for acute cholecystitis; also with a small amount of free fluid and wall thickening along the descending duodenum -Clear liquids for now -Morphine did not work for pain, will change to Dilaudid  -Zofran for nausea -Surgery is consulting -Empiric coverage with Zosyn for now   COVID -To complete last dose of molnupiravir today -Still with respiratory symptoms of cough, SOB with conversational dyspnea -He was started on [place] O2 in the ER but this appears to be for comfort only, as his lowest O2 sat was documented as 95% -He will need stabilization from a COVID standpoint prior to surgery if possible, given the likelihood of increased all-cause morbidity/mortality associated with peri-COVID operative procedures -Will defer to surgery but appears likely to have surgery on 12/26  Afib -Hold Eliquis in anticipation of surgery -Rate controlled with amiodarone, diltiazem, metoprolol  Stage 4  CKD -Appears to be stable at this time, but with advanced CKD and likely needs outpatient referral to nephrology -Attempt to avoid nephrotoxic medications -Recheck BMP in AM  -CTA with an indeterminate exophytic lesion along the inferior pole of L kidney that needs further evaluation as an outpatient with abdominal MRI -Mildly worsened anemia, likely related to CKD but will recheck CBC in AM  CAD/PVD including AAA -AAA is 3.7 cm with no evidence of dissection; plan for f/u US in 2 years -No report of CP at this time, s/p CABG  HTN -Stop amlodipine - should not be on 2 CCBs and needs diltiazem more -Continue doxazsin, metoprolol -Hold HCTZ, Lasix, and lisinopril given advanced CKD -Will cover with prn IV hydralazine  HLD -Continue atorvastatin  OSA -Continue CPAP  Gout -Continue allopurinol   Advance Care Planning:   Code Status: Full Code - Code status was discussed with the patient and/or family at the time of admission.  The patient would want to receive full resuscitative  measures at this time.   Consults: Surgery  DVT Prophylaxis: SCDs  Family Communication: Wife was present throughout evaluation and sons were present telephonically  Severity of Illness: The appropriate patient status for this patient is INPATIENT. Inpatient status is judged to be reasonable and necessary in order to provide the required intensity of service to ensure the patient's safety. The patient's presenting symptoms, physical exam findings, and initial radiographic and laboratory data in the context of their chronic comorbidities is felt to place them at high risk for further clinical deterioration. Furthermore, it is not anticipated that the patient will be medically stable for discharge from the hospital within 2 midnights of admission.   * I certify that at the point of admission it is my clinical judgment that the patient will require inpatient hospital care spanning beyond 2 midnights from the  point of admission due to high intensity of service, high risk for further deterioration and high frequency of surveillance required.*  Author: Karmen Bongo, MD 09/21/2022 11:21 AM  For on call review [email or web address].

## 2022-09-21 NOTE — ED Triage Notes (Addendum)
Patient BIB [facility] from home w/ c/o severe abdominal pain that woke him up this AM. Pain radiating to R shoulder. Hx of abdominal aneurysm monitored by Dr. Tamala Julian w/ cardiology. Diaphoretic on arrival. Patient activated as a code medical.   VSS w EMS.  100 mcg of fentanyl and 4 mg of zofran administered PTA.  Patient test positive for COVID 19 on Tuesday 12/19.

## 2022-09-22 ENCOUNTER — Inpatient Hospital Stay (HOSPITAL_COMMUNITY): Payer: Medicare PPO

## 2022-09-22 DIAGNOSIS — K810 Acute cholecystitis: Secondary | ICD-10-CM | POA: Diagnosis not present

## 2022-09-22 LAB — COMPREHENSIVE METABOLIC PANEL
ALT: 16 U/L (ref 0–44)
AST: 16 U/L (ref 15–41)
Albumin: 2.3 g/dL — ABNORMAL LOW (ref 3.5–5.0)
Alkaline Phosphatase: 35 U/L — ABNORMAL LOW (ref 38–126)
Anion gap: 13 (ref 5–15)
BUN: 38 mg/dL — ABNORMAL HIGH (ref 8–23)
CO2: 21 mmol/L — ABNORMAL LOW (ref 22–32)
Calcium: 8 mg/dL — ABNORMAL LOW (ref 8.9–10.3)
Chloride: 98 mmol/L (ref 98–111)
Creatinine, Ser: 3.79 mg/dL — ABNORMAL HIGH (ref 0.61–1.24)
GFR, Estimated: 16 mL/min — ABNORMAL LOW (ref >60)
Glucose, Bld: 98 mg/dL (ref 70–99)
Potassium: 4.6 mmol/L (ref 3.5–5.1)
Sodium: 132 mmol/L — ABNORMAL LOW (ref 135–145)
Total Bilirubin: 1.1 mg/dL (ref 0.3–1.2)
Total Protein: 5.6 g/dL — ABNORMAL LOW (ref 6.5–8.1)

## 2022-09-22 LAB — CBC
HCT: 25.5 % — ABNORMAL LOW (ref 39.0–52.0)
Hemoglobin: 8.9 g/dL — ABNORMAL LOW (ref 13.0–17.0)
MCH: 34.2 pg — ABNORMAL HIGH (ref 26.0–34.0)
MCHC: 34.9 g/dL (ref 30.0–36.0)
MCV: 98.1 fL (ref 80.0–100.0)
Platelets: 146 10*3/uL — ABNORMAL LOW (ref 150–400)
RBC: 2.6 MIL/uL — ABNORMAL LOW (ref 4.22–5.81)
RDW: 13.8 % (ref 11.5–15.5)
WBC: 6.7 10*3/uL (ref 4.0–10.5)
nRBC: 0 % (ref 0.0–0.2)

## 2022-09-22 LAB — MRSA NEXT GEN BY PCR, NASAL: MRSA by PCR Next Gen: DETECTED — AB

## 2022-09-22 LAB — PROCALCITONIN: Procalcitonin: 7.1 ng/mL

## 2022-09-22 LAB — PROTIME-INR
INR: 1.7 — ABNORMAL HIGH (ref 0.8–1.2)
Prothrombin Time: 19.4 seconds — ABNORMAL HIGH (ref 11.4–15.2)

## 2022-09-22 LAB — GLUCOSE, CAPILLARY: Glucose-Capillary: 115 mg/dL — ABNORMAL HIGH (ref 70–99)

## 2022-09-22 LAB — BRAIN NATRIURETIC PEPTIDE: B Natriuretic Peptide: 293.1 pg/mL — ABNORMAL HIGH (ref 0.0–100.0)

## 2022-09-22 LAB — MAGNESIUM: Magnesium: 1.7 mg/dL (ref 1.7–2.4)

## 2022-09-22 MED ORDER — ALBUTEROL SULFATE (2.5 MG/3ML) 0.083% IN NEBU
2.5000 mg | INHALATION_SOLUTION | Freq: Four times a day (QID) | RESPIRATORY_TRACT | Status: DC | PRN
  Administered 2022-09-23: 2.5 mg via RESPIRATORY_TRACT
  Filled 2022-09-22 (×3): qty 3

## 2022-09-22 MED ORDER — DILTIAZEM HCL 25 MG/5ML IV SOLN: 10.0000 mg | Freq: Four times a day (QID) | INTRAVENOUS | Status: DC | PRN

## 2022-09-22 MED ORDER — PANTOPRAZOLE SODIUM 40 MG PO TBEC
40.0000 mg | DELAYED_RELEASE_TABLET | Freq: Every day | ORAL | Status: DC
  Administered 2022-09-22 – 2022-09-29 (×8): 40 mg via ORAL
  Filled 2022-09-22 (×8): qty 1

## 2022-09-22 MED ORDER — FUROSEMIDE 10 MG/ML IJ SOLN
40.0000 mg | Freq: Once | INTRAMUSCULAR | Status: AC
  Administered 2022-09-22: 40 mg via INTRAVENOUS
  Filled 2022-09-22: qty 4

## 2022-09-22 MED ORDER — NALOXONE HCL 0.4 MG/ML IJ SOLN
0.4000 mg | INTRAMUSCULAR | Status: DC | PRN
  Administered 2022-09-22: 0.4 mg via INTRAVENOUS
  Filled 2022-09-22: qty 1

## 2022-09-22 MED ORDER — LACTATED RINGERS IV SOLN: INTRAVENOUS | Status: DC

## 2022-09-22 MED ORDER — FUROSEMIDE 10 MG/ML IJ SOLN
20.0000 mg | Freq: Once | INTRAMUSCULAR | Status: DC
  Filled 2022-09-22: qty 2

## 2022-09-22 MED ORDER — NITROGLYCERIN 0.4 MG SL SUBL: 0.4000 mg | SUBLINGUAL_TABLET | SUBLINGUAL | Status: DC | PRN

## 2022-09-22 NOTE — Progress Notes (Signed)
Overnight event  In brief, 76 year old male with history of CAD, AAA, CKD stage IV, A-fib on Eliquis, carotid stenosis, hypertension, hyperlipidemia, OSA on CPAP, chronic diastolic CHF admitted yesterday for acute cholecystitis, COVID-19 pneumonia, and acute on chronic diastolic CHF.  He is on molnupiravir and received Lasix.  General surgery planning on taking him to the OR tomorrow for lap chole.  This evening I was notified by RN that patient has been receiving oxycodone for pain and has become increasingly more confused and lethargic with pinpoint pupils.  Expiratory wheezing on exam.  Vital signs: Temperature 99.8 F, heart rate 84, respiratory rate 14, blood pressure 133/57, SpO2 97% on 2 L.  CBG 115.  Medication record reviewed, last received oxycodone 10 mg at 5:48 PM.  He also has an order for IV Dilaudid PRN but has not received any doses today.    Patient was given a dose of Narcan and responded immediately, became awake and alert.  Currently AAO x 4.  Albuterol neb ordered for wheezing.  No history of asthma or COPD.  Will continue to monitor very closely.  Holding opiates as patient is not having any pain at present.

## 2022-09-22 NOTE — Progress Notes (Signed)
Patient ID: Erik Perez, male   DOB: [birth year], 76 y.o.   MRN: [id number]      Subjective: Some RUQ pain ROS negative except as listed above. Objective: Vital signs in last 24 hours: Temp:  [98.2 F (36.8 C)-99.1 F (37.3 C)] 98.7 F (37.1 C) (12/25 0823) Pulse Rate:  [76-85] 82 (12/25 0823) Resp:  [17-20] 18 (12/25 0823) BP: (120-147)/(49-65) 127/49 (12/25 0823) SpO2:  [93 %-97 %] 93 % (12/25 0823) Last BM Date : 09/18/22  Intake/Output from previous day: 12/24 0701 - 12/25 0700 In: 50 [IV Piggyback:50] Out: 550 [Urine:550] Intake/Output this shift: No intake/output data recorded.  General appearance: alert and cooperative Cardio: irregularly irregular rhythm GI: soft, tender RUQ Lungs some rhonchi  Lab Results: CBC  Recent Labs    09/21/22 0810 09/22/22 0552  WBC 4.5 6.7  HGB 9.4* 8.9*  HCT 28.5* 25.5*  PLT 157 146*   BMET Recent Labs    09/21/22 0810 09/22/22 0552  NA 132* 132*  K 4.1 4.6  CL 99 98  CO2 21* 21*  GLUCOSE 145* 98  BUN 25* 38*  CREATININE 2.66* 3.79*  CALCIUM 8.2* 8.0*   PT/INR Recent Labs    09/21/22 0810  LABPROT 21.9*  INR 1.9*   ABG No results for input(s): "PHART", "HCO3" in the last 72 hours.  Invalid input(s): "PCO2", "PO2"  Studies/Results:   Anti-infectives: Anti-infectives (From admission, onward)    Start     Dose/Rate Route Frequency Ordered Stop   09/21/22 1400  piperacillin-tazobactam (ZOSYN) IVPB 3.375 g        3.375 g 12.5 mL/hr over 240 Minutes Intravenous Every 8 hours 09/21/22 1031     09/21/22 1015  molnupiravir EUA (LAGEVRIO) capsule 800 mg        4 capsule Oral 2 times daily 09/21/22 1000 09/26/22 0959   09/21/22 0830  piperacillin-tazobactam (ZOSYN) IVPB 3.375 g        3.375 g 100 mL/hr over 30 Minutes Intravenous  Once 09/21/22 3419 09/21/22 3790       Assessment/Plan: Cholecystitis - for laparoscopic cholecystectomy tomorrow by Dr. Thermon Leyland allowing Eliquis to wear off. I  discussed the procedure, risks, and benefits with him and his wife as well as one of his sons on the phone. He agrees.  ID - Zosyn IV, COVID per primary team   LOS: 1 day    Georganna Skeans, MD, MPH, FACS Trauma & General Surgery Use [email or web address] to contact on call provider  09/22/2022

## 2022-09-22 NOTE — Evaluation (Signed)
Physical Therapy Evaluation Patient Details Name: Erik Perez MRN: [id number] DOB: Sep 09, 1946 Today's Date: 09/22/2022  History of Present Illness  Pt is a 76 y.o. male admitted 09/21/22 with abdominal pain. Workup revealed acute cholecystitis. Plan for lap chole 12/26. Known (+) COVID-19 dx 09/17/22. Other PMH includes CAD, AAA, CKD, CKD, afibn on Eliquis, HTN, HLD, OSA on CPAP.   Clinical Impression  Pt presents with an overall decrease in functional mobility secondary to above. PTA, pt independent without DME, lives with wife, retired from work. Today, pt able to transfer and ambulate without DME, intermittent bouts of self-corrected instability and notable SOB with activity. Pt would benefit from continued acute PT services to maximize functional mobility and independence prior to d/c with HHPT services.     SpO2 91-92% on RA, HR 90s    Recommendations for follow up therapy are one component of a multi-disciplinary discharge planning process, led by the attending physician.  Recommendations may be updated based on patient status, additional functional criteria and insurance authorization.  Follow Up Recommendations Home health PT      Assistance Recommended at Discharge Intermittent Supervision/Assistance  Patient can return home with the following  A little help with bathing/dressing/bathroom;Assistance with cooking/housework    Equipment Recommendations None recommended by PT  Recommendations for Other Services   Mobility Specialist    Functional Status Assessment Patient has had a recent decline in their functional status and demonstrates the ability to make significant improvements in function in a reasonable and predictable amount of time.     Precautions / Restrictions Precautions Precautions: Fall;Other (comment) Precaution Comments: watch SpO2 (does not wear baseline) Restrictions Weight Bearing Restrictions: No      Mobility  Bed Mobility Overal bed  mobility: Needs Assistance Bed Mobility: Supine to Sit     Supine to sit: Min assist     General bed mobility comments: minA for HHA to elevate trunk; SOB once sitting EOB requiring seated rest break, cues for pursed lip breathing    Transfers Overall transfer level: Needs assistance Equipment used: None Transfers: Sit to/from Stand Sit to Stand: Min guard                Ambulation/Gait Ambulation/Gait assistance: Min guard Gait Distance (Feet): 80 Feet Assistive device: None Gait Pattern/deviations: Step-through pattern, Decreased stride length, Staggering right, Staggering left Gait velocity: Decreased     General Gait Details: slow, mildly unsteady gait without DME, pt walking laps in room with min guard for balance due to self-corrected bouts of instability; notable SOB, SpO2 91% on RA  Stairs            Wheelchair Mobility    Modified Rankin (Stroke Patients Only)       Balance Overall balance assessment: Needs assistance Sitting-balance support: No upper extremity supported, Feet supported Sitting balance-Leahy Scale: Good     Standing balance support: No upper extremity supported, During functional activity Standing balance-Leahy Scale: Fair Standing balance comment: ambulatory without DME though self-corrected instability                             Pertinent Vitals/Pain Pain Assessment Pain Assessment: Faces Faces Pain Scale: Hurts a little bit Pain Location: abdomen Pain Descriptors / Indicators: Discomfort Pain Intervention(s): Monitored during session    Home Living Family/patient expects to be discharged to:: Private residence Living Arrangements: Spouse/significant other Available Help at Discharge: Family;Available 24 hours/day Type of Home: House Home Access: Stairs  to enter Entrance Stairs-Rails: Right Entrance Stairs-Number of Steps: flight   Home Layout: Two level;Able to live on main level with  bedroom/bathroom Home Equipment: Rolling Walker (2 wheels);Shower seat;Grab bars - toilet;Grab bars - tub/shower      Prior Function Prior Level of Function : Independent/Modified Independent;Driving             Mobility Comments: independent without DME; has had some challenges with stairs due to SOB. drives. retired from work ADLs Comments: indep with ADLs, stands to shower (but has seat if needed)     Hand Dominance        Extremity/Trunk Assessment   Upper Extremity Assessment Upper Extremity Assessment: Overall WFL for tasks assessed    Lower Extremity Assessment Lower Extremity Assessment: Generalized weakness       Communication   Communication: No difficulties  Cognition Arousal/Alertness: Awake/alert Behavior During Therapy: WFL for tasks assessed/performed Overall Cognitive Status: Within Functional Limits for tasks assessed                                 General Comments: WFL for simple tasks, not formally assessed        General Comments General comments (skin integrity, edema, etc.): SpO2 91-92% on RA, HR 90s    Exercises     Assessment/Plan    PT Assessment Patient needs continued PT services  PT Problem List Decreased strength;Decreased activity tolerance;Decreased balance;Decreased mobility;Cardiopulmonary status limiting activity       PT Treatment Interventions DME instruction;Gait training;Stair training;Functional mobility training;Therapeutic activities;Therapeutic exercise;Balance training;Patient/family education    PT Goals (Current goals can be found in the Care Plan section)  Acute Rehab PT Goals Patient Stated Goal: interested in HHPT services PT Goal Formulation: With patient Time For Goal Achievement: 10/06/22 Potential to Achieve Goals: Good    Frequency Min 3X/week     Co-evaluation               AM-PAC PT "6 Clicks" Mobility  Outcome Measure Help needed turning from your back to your side while  in a flat bed without using bedrails?: A Little Help needed moving from lying on your back to sitting on the side of a flat bed without using bedrails?: A Little Help needed moving to and from a bed to a chair (including a wheelchair)?: A Little Help needed standing up from a chair using your arms (e.g., wheelchair or bedside chair)?: A Little Help needed to walk in hospital room?: A Little Help needed climbing 3-5 steps with a railing? : A Little 6 Click Score: 18    End of Session Equipment Utilized During Treatment: Gait belt Activity Tolerance: Patient tolerated treatment well Patient left: in bed;with call bell/phone within reach;with family/visitor present (sitting EOB to eat lunch) Nurse Communication: Mobility status PT Visit Diagnosis: Other abnormalities of gait and mobility (R26.89);Muscle weakness (generalized) (M62.81)    Time: 1050-1105 PT Time Calculation (min) (ACUTE ONLY): 15 min   Charges:   PT Evaluation $PT Eval Moderate Complexity: [place], PT, DPT Acute Rehabilitation Services  Personal: [facility] Office: [place] 09/22/2022, 11:44 AM

## 2022-09-22 NOTE — Progress Notes (Signed)
PROGRESS NOTE                                                                                                                                                                                                             Patient Demographics:    Erik Perez, is a 76 y.o. male, DOB - 31-Aug-1946, EBR:[id number]  Outpatient Primary MD for the patient is Lurline Del, DO    LOS - 1  Admit date - 09/21/2022    Chief Complaint  Patient presents with   Abdominal Pain       Brief Narrative (HPI from H&P)    76 y.o. male with medical history significant of CAD, AAA, CKD, afib on Eliquis (last dose night), carotid stenosis, HTN, HLD, and OSA on CPAP presenting with abdominal pain. Patient reports that he has had mild SOB and fatigue since diagnosed with afib in October.  He became acutely ill with cough, congestion and was diagnosed with COVID on 12/20, started treatment with molnupiravir that day (completing 5 days today).  He also had a known AAA.  He started about 0600 with acute R-sided abdominal pain that radiated to his R shoulder.  The pain later spread across his mid abdomen to both flanks, him to the ER where workup was suggestive of acute cholecystitis, he was seen by general surgery and admitted by [facility].   Subjective:    Erik Perez today has, No headache, No chest pain, No abdominal pain - No Nausea, No new weakness tingling or numbness, mild SOB.   Assessment  & Plan :    Acute cholecystitis  - Patient in by [place] due for surgery 09/23/2022 as he was taking Eliquis with last dose being 09/20/2022, continue supportive care and antibiotics.  Bowel rest gentle hydration.   COVID 19 pneumonia present on admission.  Finished his antiviral treatment yesterday, continue supportive care.  Total 10-day of isolation which is ending 09/27/2022.   Paroxysmal A-fib with [place] vas 2 score of greater than 4.  Eliquis on hold for  surgery last dose 09/20/2022, continue beta-blocker and amiodarone, diltiazem as needed.  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  Lasix, continue beta-blocker, be careful with IV fluids.  Stage 4 CKD  -Appears to be stable at this time, but with advanced CKD and likely needs outpatient referral to  nephrology  HTN  - currently on beta-blocker and stable as needed IV hydralazine.  Monitor.  Discontinue Norvasc upon discharge as he is on Cardizem as well.   HLD  -Continue atorvastatin   OSA  -Continue CPAP   Gout  -Continue allopurinol  Infrarenal abdominal aortic aneurysm measures 3.7 cm - incidental, defer to PCP for monitoring closely.  Beta-blocker if he can tolerate.  CTA with an indeterminate exophytic lesion along the inferior pole of L kidney - needs further evaluation as an outpatient with abdominal MRI, defer to PCP.       Condition - Extremely Guarded  Family Communication  :  wife bedside 09/22/22  Code Status :  Full  Consults  :  CCS  PUD Prophylaxis : PPI   Procedures  :     CT - 1. No evidence for thoracic or abdominal aortic dissection. 2. Gallstones with diffuse gallbladder wall thickening and pericholecystic inflammation. Findings are concerning for acute cholecystitis. There is small volume of free fluid which appears mildly complex measuring 19 Hounsfield units extending from the gallbladder along the right pericolic gutter and along bilateral peritoneal reflections within the lower abdomen. 3. Wall thickening involving the descending portion of the duodenum adjacent to the gallbladder is likely reactive in the setting of acute cholecystitis. 4. Infrarenal abdominal aortic aneurysm measures 3.7 cm. Recommend follow-up ultrasound every 2 years. This recommendation follows ACR consensus guidelines: White Paper of the ACR Incidental Findings Committee II on Vascular Findings. J Am Coll Radiol 2013; 10:789-794. 5. Trace pleural fluid bilaterally.  6. Indeterminate exophytic lesion arising off the inferior pole of the left kidney. This may represent a hemorrhagic cyst or enhancing kidney lesion. This does not require emergent attention. When the patient is clinically stable and able to follow directions and hold their breath (preferably as an outpatient) further evaluation with dedicated abdominal MRI should be considered. 7.  Aortic Atherosclerosis      Disposition Plan  :    Status is: Inpatient  DVT Prophylaxis  :    SCDs Start: 09/21/22 0954   Lab Results  Component Value Date   PLT 146 (L) 09/22/2022    Diet :  Diet Order             Diet NPO time specified  Diet effective midnight           Diet clear liquid Room service appropriate? Yes; Fluid consistency: Thin  Diet effective now                    Inpatient Medications  Scheduled Meds:  allopurinol  100 mg Oral BID   amiodarone  200 mg Oral Q0600   atorvastatin  40 mg Oral Daily   doxazosin  4 mg Oral Daily   metoprolol tartrate  75 mg Oral BID   molnupiravir EUA  4 capsule Oral BID   Continuous Infusions:  [START ON 09/23/2022] lactated ringers     piperacillin-tazobactam (ZOSYN)  IV 3.375 g (09/22/22 0523)   PRN Meds:.acetaminophen **OR** acetaminophen, albuterol, camphor-menthol **AND** hydrOXYzine, diltiazem, hydrALAZINE, HYDROmorphone (DILAUDID) injection, ondansetron **OR** ondansetron (ZOFRAN) IV, oxyCODONE     Objective:   Vitals:   09/21/22 1921 09/21/22 2155 09/22/22 0519 09/22/22 0823  BP: (!) 122/55 (!) 132/50 (!) 120/53 (!) 127/49  Pulse: 78 79 79 82  Resp:   18 18  Temp: 99.1 F (37.3 C)  98.2 F (36.8 C) 98.7 F (37.1 C)  TempSrc: Oral  Oral Oral  SpO2:  97%   93%    Wt Readings from Last 3 Encounters:  09/12/22 104.3 kg  09/05/22 103.4 kg  09/04/22 107 kg     Intake/Output Summary (Last 24 hours) at 09/22/2022 0930 Last data filed at 09/21/2022 2011 Gross per 24 hour  Intake 50 ml  Output 550 ml  Net -500 ml      Physical Exam  Awake Alert, No new F.N deficits, Normal affect .AT,PERRAL Supple Neck, No JVD,   Symmetrical Chest wall movement, Good air movement bilaterally, + rales RRR,No Gallops,Rubs or new Murmurs,  +ve B.Sounds, Abd mildly distended with some right-sided tenderness mostly in the upper quadrant No Cyanosis, Clubbing or edema       Data Review:    Recent Labs  Lab 09/21/22 0725 09/21/22 0810 09/22/22 0552  WBC  --  4.5 6.7  HGB 10.2* 9.4* 8.9*  HCT 30.0* 28.5* 25.5*  PLT  --  157 146*  MCV  --  100.0 98.1  MCH  --  33.0 34.2*  MCHC  --  33.0 34.9  RDW  --  13.6 13.8    Recent Labs  Lab 09/21/22 0725 09/21/22 0810 09/22/22 0552  NA 135 132* 132*  K 4.2 4.1 4.6  CL 102 99 98  CO2  --  21* 21*  ANIONGAP  --  12 13  GLUCOSE 143* 145* 98  BUN 28* 25* 38*  CREATININE 2.60* 2.66* 3.79*  AST  --  17 16  ALT  --  16 16  ALKPHOS  --  36* 35*  BILITOT  --  1.2 1.1  ALBUMIN  --  2.6* 2.3*  PROCALCITON  --   --  7.10  INR  --  1.9*  --   BNP  --   --  293.1*  MG  --   --  1.7  CALCIUM  --  8.2* 8.0*        Micro Results No results found for this or any previous visit (from the past 240 hour(s)).  Radiology Reports CT Angio Chest/Abd/Pel for Dissection W and/or Wo Contrast  Result Date: 09/21/2022 CLINICAL DATA:  Rule out acute aortic syndrome. Severe abdominal pain EXAM: CT ANGIOGRAPHY CHEST, ABDOMEN AND PELVIS TECHNIQUE: Non-contrast CT of the chest was initially obtained. Multidetector CT imaging through the chest, abdomen and pelvis was performed using the standard protocol during bolus administration of intravenous contrast. Multiplanar reconstructed images and MIPs were obtained and reviewed to evaluate the vascular anatomy. RADIATION DOSE REDUCTION: This exam was performed according to the departmental dose-optimization program which includes automated exposure control, adjustment of the mA and/or kV according to patient size and/or use of  iterative reconstruction technique. CONTRAST:  58m OMNIPAQUE IOHEXOL 350 MG/ML SOLN COMPARISON:  Abdominal aortic ultrasound 07/28/2018 FINDINGS: CTA CHEST FINDINGS Cardiovascular: Preferential opacification of the thoracic aorta. No evidence of thoracic aortic aneurysm or dissection. Normal heart size. No pericardial effusion. Aortic atherosclerosis. Previous CABG procedure. Mediastinum/Nodes: Thyroid gland, trachea appear normal. There is mild diffuse circumferential wall thickening of the esophagus which may reflect underlying esophagitis. No enlarged axillary, supraclavicular, mediastinal, or hilar lymph nodes. Lungs/Pleura: Trace pleural fluid identified bilaterally. Mild atelectasis noted in the posterior right base. No suspicious pulmonary nodule or mass. Musculoskeletal: No acute abnormality.  Previous median sternotomy. Review of the MIP images confirms the above findings. CTA ABDOMEN AND PELVIS FINDINGS VASCULAR Aorta: Infrarenal abdominal aortic aneurysm measures 3.7 cm, image 199/6. Aortic atherosclerotic calcifications. No signs of dissection. No significant stenosis. Celiac: Patent.  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 Renals: Both renal arteries are patent without evidence of aneurysm, dissection, vasculitis, fibromuscular dysplasia or significant stenosis. IMA: Patent without evidence of aneurysm, dissection, vasculitis or significant stenosis. Inflow: Patent without evidence of aneurysm, dissection, vasculitis or significant stenosis. Veins: No obvious venous abnormality within the limitations of this arterial phase study. Review of the MIP images confirms the above findings. NON-VASCULAR Hepatobiliary: Liver cysts noted.  No suspicious liver lesion. There are multiple stones within the gallbladder which measure up to  1.5 cm. There is diffuse gallbladder wall thickening and pericholecystic inflammation. Pericholecystic fluid is identified and there is fluid extending along the right pericolic gutter. Fluid measures scratch set the fluid is mildly complex measuring 19 Hounsfield units, image 275/6. No signs of bile duct dilatation. Pancreas: Unremarkable. No pancreatic ductal dilatation or surrounding inflammatory changes. Spleen: Normal in size without focal abnormality. Adrenals/Urinary Tract: There is a nodule in the right adrenal gland measuring 2 cm and 1.5 Hounsfield units. This is compatible with a benign adenoma. No follow-up imaging recommended. Bilateral kidney cysts are identified. Arising off the inferior pole of the left kidney is an exophytic lesion measuring 1.7 cm and 85 Hounsfield units. This does not meet CT criteria for benign kidney lesion. No signs of nephrolithiasis or hydronephrosis. Urinary bladder is unremarkable. Stomach/Bowel: Stomach is normal. There is wall thickening involving the descending portion of the duodenum adjacent to the gallbladder. The appendix is visualized and appears normal. No pathologic dilatation of the large or small bowel loops. Lymphatic: No enlarged abdominopelvic adenopathy. Reproductive: Moderate prostate gland enlargement. Other: No discrete fluid collection identified. No signs of pneumoperitoneum. Musculoskeletal: No acute or significant osseous findings. Review of the MIP images confirms the above findings. IMPRESSION: 1. No evidence for thoracic or abdominal aortic dissection. 2. Gallstones with diffuse gallbladder wall thickening and pericholecystic inflammation. Findings are concerning for acute cholecystitis. There is small volume of free fluid which appears mildly complex measuring 19 Hounsfield units extending from the gallbladder along the right pericolic gutter and along bilateral peritoneal reflections within the lower abdomen. 3. Wall thickening involving the  descending portion of the duodenum adjacent to the gallbladder is likely reactive in the setting of acute cholecystitis. 4. Infrarenal abdominal aortic aneurysm measures 3.7 cm. Recommend follow-up ultrasound every 2 years. This recommendation follows ACR consensus guidelines: White Paper of the ACR Incidental Findings Committee II on Vascular Findings. J Am Coll Radiol 2013; 10:789-794. 5. Trace pleural fluid bilaterally. 6. Indeterminate exophytic lesion arising off the inferior pole of the left kidney. This may represent a hemorrhagic cyst or enhancing kidney lesion. This does not require emergent attention. When the patient is clinically stable and able to follow directions and hold their breath (preferably as an outpatient) further evaluation with dedicated abdominal MRI should be considered. 7.  Aortic Atherosclerosis (ICD10-I70.0). Electronically Signed   By: Kerby Moors M.D.   On: 09/21/2022 08:15      Signature  -   Lala Lund M.D on 09/22/2022 at 9:30 AM   -  To page go to [email or web address]

## 2022-09-23 ENCOUNTER — Other Ambulatory Visit: Payer: Self-pay

## 2022-09-23 ENCOUNTER — Encounter (HOSPITAL_COMMUNITY)
Admission: EM | Disposition: A | Discharge status: Home Health | Payer: Self-pay | Source: Home / Self Care | Attending: Internal Medicine

## 2022-09-23 ENCOUNTER — Inpatient Hospital Stay (HOSPITAL_COMMUNITY): Payer: Medicare PPO | Admitting: Certified Registered"

## 2022-09-23 ENCOUNTER — Inpatient Hospital Stay (HOSPITAL_COMMUNITY): Payer: Medicare PPO

## 2022-09-23 DIAGNOSIS — K82A2 Perforation of gallbladder in cholecystitis: Secondary | ICD-10-CM | POA: Diagnosis not present

## 2022-09-23 DIAGNOSIS — K810 Acute cholecystitis: Secondary | ICD-10-CM

## 2022-09-23 DIAGNOSIS — K650 Generalized (acute) peritonitis: Secondary | ICD-10-CM | POA: Diagnosis not present

## 2022-09-23 DIAGNOSIS — Z9989 Dependence on other enabling machines and devices: Secondary | ICD-10-CM

## 2022-09-23 DIAGNOSIS — G4733 Obstructive sleep apnea (adult) (pediatric): Secondary | ICD-10-CM | POA: Diagnosis not present

## 2022-09-23 HISTORY — PX: CHOLECYSTECTOMY: SHX55

## 2022-09-23 LAB — CBC WITH DIFFERENTIAL/PLATELET
Abs Immature Granulocytes: 0.06 10*3/uL (ref 0.00–0.07)
Basophils Absolute: 0 10*3/uL (ref 0.0–0.1)
Basophils Relative: 0 %
Eosinophils Absolute: 0 10*3/uL (ref 0.0–0.5)
Eosinophils Relative: 0 %
HCT: 25 % — ABNORMAL LOW (ref 39.0–52.0)
Hemoglobin: 8.7 g/dL — ABNORMAL LOW (ref 13.0–17.0)
Immature Granulocytes: 1 %
Lymphocytes Relative: 8 %
Lymphs Abs: 0.7 10*3/uL (ref 0.7–4.0)
MCH: 33.9 pg (ref 26.0–34.0)
MCHC: 34.8 g/dL (ref 30.0–36.0)
MCV: 97.3 fL (ref 80.0–100.0)
Monocytes Absolute: 0.7 10*3/uL (ref 0.1–1.0)
Monocytes Relative: 8 %
Neutro Abs: 7.3 10*3/uL (ref 1.7–7.7)
Neutrophils Relative %: 83 %
Platelets: 150 10*3/uL (ref 150–400)
RBC: 2.57 MIL/uL — ABNORMAL LOW (ref 4.22–5.81)
RDW: 13.8 % (ref 11.5–15.5)
WBC: 8.6 10*3/uL (ref 4.0–10.5)
nRBC: 0 % (ref 0.0–0.2)

## 2022-09-23 LAB — COMPREHENSIVE METABOLIC PANEL
ALT: 18 U/L (ref 0–44)
AST: 19 U/L (ref 15–41)
Albumin: 2.3 g/dL — ABNORMAL LOW (ref 3.5–5.0)
Alkaline Phosphatase: 40 U/L (ref 38–126)
Anion gap: 14 (ref 5–15)
BUN: 49 mg/dL — ABNORMAL HIGH (ref 8–23)
CO2: 19 mmol/L — ABNORMAL LOW (ref 22–32)
Calcium: 7.9 mg/dL — ABNORMAL LOW (ref 8.9–10.3)
Chloride: 97 mmol/L — ABNORMAL LOW (ref 98–111)
Creatinine, Ser: 5 mg/dL — ABNORMAL HIGH (ref 0.61–1.24)
GFR, Estimated: 11 mL/min — ABNORMAL LOW (ref >60)
Glucose, Bld: 90 mg/dL (ref 70–99)
Potassium: 4.6 mmol/L (ref 3.5–5.1)
Sodium: 130 mmol/L — ABNORMAL LOW (ref 135–145)
Total Bilirubin: 1 mg/dL (ref 0.3–1.2)
Total Protein: 5.9 g/dL — ABNORMAL LOW (ref 6.5–8.1)

## 2022-09-23 LAB — BRAIN NATRIURETIC PEPTIDE: B Natriuretic Peptide: 370.4 pg/mL — ABNORMAL HIGH (ref 0.0–100.0)

## 2022-09-23 LAB — URIC ACID: Uric Acid, Serum: 5.8 mg/dL (ref 3.7–8.6)

## 2022-09-23 LAB — MAGNESIUM: Magnesium: 1.8 mg/dL (ref 1.7–2.4)

## 2022-09-23 LAB — PROCALCITONIN: Procalcitonin: 8.25 ng/mL

## 2022-09-23 LAB — C-REACTIVE PROTEIN: CRP: 36.8 mg/dL — ABNORMAL HIGH (ref <1.0)

## 2022-09-23 SURGERY — LAPAROSCOPIC CHOLECYSTECTOMY
Anesthesia: General | Site: Abdomen

## 2022-09-23 MED ORDER — OXYCODONE HCL 5 MG PO TABS: 10.0000 mg | ORAL_TABLET | ORAL | Status: DC | PRN

## 2022-09-23 MED ORDER — ROCURONIUM BROMIDE 10 MG/ML (PF) SYRINGE
PREFILLED_SYRINGE | INTRAVENOUS | Status: AC
  Filled 2022-09-23: qty 10

## 2022-09-23 MED ORDER — BUPIVACAINE HCL 0.25 % IJ SOLN
INTRAMUSCULAR | Status: DC | PRN
  Administered 2022-09-23: 30 mL

## 2022-09-23 MED ORDER — PIPERACILLIN-TAZOBACTAM IN DEX 2-0.25 GM/50ML IV SOLN
2.2500 g | Freq: Four times a day (QID) | INTRAVENOUS | Status: DC
  Administered 2022-09-23 – 2022-09-27 (×16): 2.25 g via INTRAVENOUS
  Filled 2022-09-23 (×18): qty 50

## 2022-09-23 MED ORDER — SUGAMMADEX SODIUM 200 MG/2ML IV SOLN
INTRAVENOUS | Status: DC | PRN
  Administered 2022-09-23: 200 mg via INTRAVENOUS

## 2022-09-23 MED ORDER — HEMOSTATIC AGENTS (NO CHARGE) OPTIME
TOPICAL | Status: DC | PRN
  Administered 2022-09-23: 1

## 2022-09-23 MED ORDER — LACTATED RINGERS IV SOLN: INTRAVENOUS | Status: DC

## 2022-09-23 MED ORDER — DEXAMETHASONE SODIUM PHOSPHATE 10 MG/ML IJ SOLN
INTRAMUSCULAR | Status: DC | PRN
  Administered 2022-09-23: 10 mg via INTRAVENOUS

## 2022-09-23 MED ORDER — SODIUM CHLORIDE 0.9 % IR SOLN
Status: DC | PRN
  Administered 2022-09-23: 1000 mL

## 2022-09-23 MED ORDER — SIMETHICONE 80 MG PO CHEW: 80.0000 mg | CHEWABLE_TABLET | Freq: Four times a day (QID) | ORAL | Status: DC | PRN

## 2022-09-23 MED ORDER — LIDOCAINE 2% (20 MG/ML) 5 ML SYRINGE
INTRAMUSCULAR | Status: DC | PRN
  Administered 2022-09-23: 60 mg via INTRAVENOUS

## 2022-09-23 MED ORDER — PROCHLORPERAZINE EDISYLATE 10 MG/2ML IJ SOLN: 10.0000 mg | INTRAMUSCULAR | Status: DC | PRN

## 2022-09-23 MED ORDER — VASOPRESSIN 20 UNIT/ML IV SOLN
INTRAVENOUS | Status: DC | PRN
  Administered 2022-09-23: 1 [IU] via INTRAVENOUS

## 2022-09-23 MED ORDER — BUPIVACAINE HCL (PF) 0.25 % IJ SOLN
INTRAMUSCULAR | Status: AC
  Filled 2022-09-23: qty 30

## 2022-09-23 MED ORDER — ONDANSETRON HCL 4 MG/2ML IJ SOLN: 4.0000 mg | Freq: Four times a day (QID) | INTRAMUSCULAR | Status: DC | PRN

## 2022-09-23 MED ORDER — IPRATROPIUM-ALBUTEROL 0.5-2.5 (3) MG/3ML IN SOLN
RESPIRATORY_TRACT | Status: AC
  Filled 2022-09-23: qty 3

## 2022-09-23 MED ORDER — ROCURONIUM BROMIDE 10 MG/ML (PF) SYRINGE
PREFILLED_SYRINGE | INTRAVENOUS | Status: DC | PRN
  Administered 2022-09-23: 10 mg via INTRAVENOUS
  Administered 2022-09-23: 50 mg via INTRAVENOUS

## 2022-09-23 MED ORDER — ALBUMIN HUMAN 5 % IV SOLN: INTRAVENOUS | Status: DC | PRN

## 2022-09-23 MED ORDER — 0.9 % SODIUM CHLORIDE (POUR BTL) OPTIME
TOPICAL | Status: DC | PRN
  Administered 2022-09-23: 1000 mL

## 2022-09-23 MED ORDER — OXYCODONE HCL 5 MG PO TABS: 5.0000 mg | ORAL_TABLET | ORAL | Status: DC | PRN

## 2022-09-23 MED ORDER — PHENYLEPHRINE 80 MCG/ML (10ML) SYRINGE FOR IV PUSH (FOR BLOOD PRESSURE SUPPORT)
PREFILLED_SYRINGE | INTRAVENOUS | Status: DC | PRN
  Administered 2022-09-23 (×2): 160 ug via INTRAVENOUS
  Administered 2022-09-23: 200 ug via INTRAVENOUS
  Administered 2022-09-23: 160 ug via INTRAVENOUS

## 2022-09-23 MED ORDER — IPRATROPIUM-ALBUTEROL 0.5-2.5 (3) MG/3ML IN SOLN
3.0000 mL | Freq: Once | RESPIRATORY_TRACT | Status: AC
  Administered 2022-09-23: 3 mL via RESPIRATORY_TRACT

## 2022-09-23 MED ORDER — PHENYLEPHRINE HCL-NACL 20-0.9 MG/250ML-% IV SOLN
INTRAVENOUS | Status: DC | PRN
  Administered 2022-09-23: 50 ug/min via INTRAVENOUS

## 2022-09-23 MED ORDER — LIDOCAINE 2% (20 MG/ML) 5 ML SYRINGE
INTRAMUSCULAR | Status: AC
  Filled 2022-09-23: qty 5

## 2022-09-23 MED ORDER — HYDROMORPHONE HCL 1 MG/ML IJ SOLN: 0.5000 mg | INTRAMUSCULAR | Status: DC | PRN

## 2022-09-23 MED ORDER — SODIUM ZIRCONIUM CYCLOSILICATE 10 G PO PACK: 10.0000 g | PACK | Freq: Once | ORAL | Status: DC

## 2022-09-23 MED ORDER — PROPOFOL 10 MG/ML IV BOLUS
INTRAVENOUS | Status: AC
  Filled 2022-09-23: qty 20

## 2022-09-23 MED ORDER — CALCIUM CHLORIDE 10 % IV SOLN
INTRAVENOUS | Status: DC | PRN
  Administered 2022-09-23: 1 g via INTRAVENOUS

## 2022-09-23 MED ORDER — SUCCINYLCHOLINE CHLORIDE 200 MG/10ML IV SOSY
PREFILLED_SYRINGE | INTRAVENOUS | Status: DC | PRN
  Administered 2022-09-23: 120 mg via INTRAVENOUS

## 2022-09-23 MED ORDER — ACETAMINOPHEN 325 MG PO TABS
650.0000 mg | ORAL_TABLET | Freq: Four times a day (QID) | ORAL | Status: DC
  Administered 2022-09-23 – 2022-09-29 (×21): 650 mg via ORAL
  Filled 2022-09-23 (×22): qty 2

## 2022-09-23 MED ORDER — ONDANSETRON HCL 4 MG/2ML IJ SOLN
INTRAMUSCULAR | Status: AC
  Filled 2022-09-23: qty 2

## 2022-09-23 MED ORDER — ONDANSETRON HCL 4 MG/2ML IJ SOLN
INTRAMUSCULAR | Status: DC | PRN
  Administered 2022-09-23: 4 mg via INTRAVENOUS

## 2022-09-23 MED ORDER — CALCIUM CHLORIDE 10 % IV SOLN
INTRAVENOUS | Status: AC
  Filled 2022-09-23: qty 10

## 2022-09-23 MED ORDER — SODIUM CHLORIDE 0.9 % IV SOLN: INTRAVENOUS | Status: DC | PRN

## 2022-09-23 MED ORDER — FENTANYL CITRATE (PF) 250 MCG/5ML IJ SOLN
INTRAMUSCULAR | Status: AC
  Filled 2022-09-23: qty 5

## 2022-09-23 MED ORDER — SODIUM ZIRCONIUM CYCLOSILICATE 10 G PO PACK
10.0000 g | PACK | Freq: Once | ORAL | Status: AC
  Administered 2022-09-23: 10 g via ORAL
  Filled 2022-09-23: qty 1

## 2022-09-23 MED ORDER — PROPOFOL 10 MG/ML IV BOLUS
INTRAVENOUS | Status: DC | PRN
  Administered 2022-09-23: 150 mg via INTRAVENOUS

## 2022-09-23 MED ORDER — FENTANYL CITRATE (PF) 250 MCG/5ML IJ SOLN
INTRAMUSCULAR | Status: DC | PRN
  Administered 2022-09-23: 100 ug via INTRAVENOUS
  Administered 2022-09-23: 50 ug via INTRAVENOUS

## 2022-09-23 MED ORDER — DEXAMETHASONE SODIUM PHOSPHATE 10 MG/ML IJ SOLN
INTRAMUSCULAR | Status: AC
  Filled 2022-09-23: qty 1

## 2022-09-23 SURGICAL SUPPLY — 53 items
APPLIER CLIP 5 13 M/L LIGAMAX5 (MISCELLANEOUS) ×2
BAG COUNTER SPONGE SURGICOUNT (BAG) ×2 IMPLANT
BIOPATCH RED 1 DISK 7.0 (GAUZE/BANDAGES/DRESSINGS) ×1 IMPLANT
CANISTER SUCT 3000ML PPV (MISCELLANEOUS) ×2 IMPLANT
CHLORAPREP W/TINT 26 (MISCELLANEOUS) ×2 IMPLANT
CLIP APPLIE 5 13 M/L LIGAMAX5 (MISCELLANEOUS) ×2 IMPLANT
COVER SURGICAL LIGHT HANDLE (MISCELLANEOUS) ×2 IMPLANT
DERMABOND ADVANCED .7 DNX12 (GAUZE/BANDAGES/DRESSINGS) ×2 IMPLANT
DRAIN CHANNEL 19F RND (DRAIN) ×1 IMPLANT
DRSG TEGADERM 2-3/8X2-3/4 SM (GAUZE/BANDAGES/DRESSINGS) ×1 IMPLANT
ELECT REM PT RETURN 9FT ADLT (ELECTROSURGICAL) ×2
ELECTRODE REM PT RTRN 9FT ADLT (ELECTROSURGICAL) ×2 IMPLANT
ENDOLOOP SUT PDS II  0 18 (SUTURE) ×2
ENDOLOOP SUT PDS II 0 18 (SUTURE) ×1 IMPLANT
EVACUATOR SILICONE 100CC (DRAIN) ×1 IMPLANT
GLOVE BIO SURGEON STRL SZ7.5 (GLOVE) ×2 IMPLANT
GLOVE BIOGEL PI IND STRL 8 (GLOVE) ×2 IMPLANT
GOWN STRL REUS W/ TWL LRG LVL3 (GOWN DISPOSABLE) ×4 IMPLANT
GOWN STRL REUS W/ TWL XL LVL3 (GOWN DISPOSABLE) ×2 IMPLANT
GOWN STRL REUS W/TWL LRG LVL3 (GOWN DISPOSABLE) ×4
GOWN STRL REUS W/TWL XL LVL3 (GOWN DISPOSABLE) ×2
GRASPER SUT TROCAR 14GX15 (MISCELLANEOUS) ×2 IMPLANT
HEMOSTAT SNOW SURGICEL 2X4 (HEMOSTASIS) ×1 IMPLANT
KIT BASIN OR (CUSTOM PROCEDURE TRAY) ×2 IMPLANT
KIT TURNOVER KIT B (KITS) ×2 IMPLANT
NDL 22X1.5 STRL (OR ONLY) (MISCELLANEOUS) ×1 IMPLANT
NDL INSUFFLATION 14GA 120MM (NEEDLE) ×1 IMPLANT
NEEDLE 22X1.5 STRL (OR ONLY) (MISCELLANEOUS) ×2 IMPLANT
NEEDLE INSUFFLATION 14GA 120MM (NEEDLE) ×2 IMPLANT
NS IRRIG 1000ML POUR BTL (IV SOLUTION) ×2 IMPLANT
PAD ARMBOARD 7.5X6 YLW CONV (MISCELLANEOUS) ×2 IMPLANT
POUCH RETRIEVAL ECOSAC 10 (ENDOMECHANICALS) ×2 IMPLANT
POUCH RETRIEVAL ECOSAC 10MM (ENDOMECHANICALS) ×2
SCISSORS LAP 5X35 DISP (ENDOMECHANICALS) ×2 IMPLANT
SET IRRIG TUBING LAPAROSCOPIC (IRRIGATION / IRRIGATOR) ×2 IMPLANT
SET TUBE SMOKE EVAC HIGH FLOW (TUBING) ×2 IMPLANT
SLEEVE ENDOPATH XCEL 5M (ENDOMECHANICALS) ×2 IMPLANT
SLEEVE Z-THREAD 5X100MM (TROCAR) ×2 IMPLANT
SPECIMEN JAR SMALL (MISCELLANEOUS) ×2 IMPLANT
SUT ETHILON 2 0 FS 18 (SUTURE) ×1 IMPLANT
SUT MNCRL AB 4-0 PS2 18 (SUTURE) ×2 IMPLANT
SWAB COLLECTION DEVICE MRSA (MISCELLANEOUS) ×1 IMPLANT
SWAB CULTURE ESWAB REG 1ML (MISCELLANEOUS) ×1 IMPLANT
TOWEL GREEN STERILE (TOWEL DISPOSABLE) ×2 IMPLANT
TOWEL GREEN STERILE FF (TOWEL DISPOSABLE) ×2 IMPLANT
TRAY LAPAROSCOPIC MC (CUSTOM PROCEDURE TRAY) ×2 IMPLANT
TROCAR 11X100 Z THREAD (TROCAR) ×1 IMPLANT
TROCAR XCEL NON-BLD 11X100MML (ENDOMECHANICALS) ×1 IMPLANT
TROCAR XCEL NON-BLD 5MMX100MML (ENDOMECHANICALS) ×1 IMPLANT
TROCAR Z-THREAD OPTICAL 5X100M (TROCAR) ×1 IMPLANT
TUBE CONNECTING 12X1/4 (SUCTIONS) ×1 IMPLANT
WARMER LAPAROSCOPE (MISCELLANEOUS) ×2 IMPLANT
WATER STERILE IRR 1000ML POUR (IV SOLUTION) ×2 IMPLANT

## 2022-09-23 NOTE — Progress Notes (Signed)
2146: Patient lethargic on assessment. Disoriented to situation and place. Patient falling asleep during conversation. Wheezing up assessment. Minimal air flow noted to lower lungs. Albuterol administered. Pupil pinpoint. Vitals stable on 2L [place]. Glucose 115 on check.   2200Marlowe Sax MD paged and  2245: Page returned, patient remains stable in same condition. New orders obtained.  2257: Patient given Narcan with good affect. Patient now A&Ox4. Work of breathing improved. Educated on use of IS. New orders obtained for prn nebs and narcan.   0000: Patient NPO at this time. Education provided.

## 2022-09-23 NOTE — Transfer of Care (Signed)
Immediate Anesthesia Transfer of Care Note  Patient: Erik Perez  Procedure(s) Performed: LAPAROSCOPIC CHOLECYSTECTOMY (Abdomen)  Patient Location: PACU  Anesthesia Type:General  Level of Consciousness: drowsy and patient cooperative  Airway & Oxygen Therapy: Patient Spontanous Breathing and Patient connected to nasal cannula oxygen  Post-op Assessment: Report given to RN and Post -op Vital signs reviewed and stable  Post vital signs: Reviewed and stable  Last Vitals:  Vitals Value Taken Time  BP 105/59 09/23/22 1330  Temp 37.3 C 09/23/22 1305  Pulse 106 09/23/22 1334  Resp 15 09/23/22 1334  SpO2 99 % 09/23/22 1334  Vitals shown include unvalidated device data.  Last Pain:  Vitals:   09/23/22 1305  TempSrc:   PainSc: 0-No pain         Complications: No notable events documented.

## 2022-09-23 NOTE — Op Note (Signed)
Patient: Erik Perez (07/28/1946, [id number])  Date of Surgery: 09/23/2022   Preoperative Diagnosis: Acute Cholecystitis   Postoperative Diagnosis: Acute Perforated Cholecystitis   Surgical Procedure: LAPAROSCOPIC CHOLECYSTECTOMY:    Operative Team Members:  Surgeon(s) and Role:    * Carri Spillers, Nickola Major, MD - Primary   Anesthesiologist: Josephine Igo, MD CRNA: Lance Coon, CRNA; Hessie Dibble T, CRNA   Anesthesia: * No anesthesia type entered *   Fluids:  Total I/O In: 1500 [I.V.:1000; IV Piggyback:500] Out: 200 [YYTKP:546]  Complications: * No complications entered in OR log *  Drains:  (19 Fr) Jackson-Pratt drain(s) with closed bulb suction in the Gallbladder fossa    Specimen:  ID Type Source Tests Collected by Time Destination  1 : gallbladder Tissue PATH Gallbladder SURGICAL PATHOLOGY Conchita Truxillo, Nickola Major, MD 09/23/2022 1148   A : ascites Body Fluid PATH Cytology Peritoneal fluid AEROBIC/ANAEROBIC CULTURE W GRAM STAIN (SURGICAL/DEEP WOUND) Lourdez Mcgahan, Nickola Major, MD 09/23/2022 1150      Disposition:  PACU - hemodynamically stable.  Plan of Care:  Continue inpatient care    Indications for Procedure: JAMEE PACHOLSKI is a 76 y.o. male who presented with abdominal pain.  History, physical and imaging was concerning for cholecystitis.  Laparoscopic cholecystectomy was recommended for the patient.  The procedure itself, as well as the risks, benefits and alternatives were discussed with the patient.  Risks discussed included but were not limited to the risk of infection, bleeding, damage to nearby structures, need to convert to open procedure, incisional hernia, bile leak, common bile duct injury and the need for additional procedures or surgeries.  With this discussion complete and all questions answered the patient granted consent to proceed.  Findings: perforated cholecystitis with purulent peritonitis  Infection status: Patient: Zacarias Pontes  Emergency General Surgery Service Patient Case: Urgent Infection Present At Time Of Surgery (PATOS):  Perforated cholecystitis with purulent ascites sent for sample from above liver   Description of Procedure:   On the date stated above, the patient was taken to the operating room suite and placed in supine positioning.  Sequential compression devices were placed on the lower extremities to prevent blood clots.  General endotracheal anesthesia was induced. Preoperative antibiotics were given.  The patient's abdomen was prepped and draped in the usual sterile fashion.  A time-out was completed verifying the correct patient, procedure, positioning and equipment needed for the case.  We began by anesthetizing the skin with local anesthetic and then making a 5 mm incision just below the umbilicus.  We dissected through the subcutaneous tissues to the fascia.  The fascia was grasped and elevated using a Kocher clamp.  A Veress needle was inserted into the abdomen and the abdomen was insufflated to 15 mmHg.  A 5 mm trocar was inserted in this position under optical guidance and then the abdomen was inspected.  There was no trauma to the underlying viscera with initial trocar placement.  Any abnormal findings, other than inflammation in the right upper quadrant, are listed above in the findings section.  Three additional trocars were placed, one 12 mm trocar in the subxiphoid position, one 5 mm trocar in the midline epigastric area and one 16m trocar in the right upper quadrant subcostally.  These were placed under direct vision without any trauma to the underlying viscera.    Purulent ascites was encountered above the liver and sent for culture and then suctioned clean.  The patient was then placed in head up, left side down positioning.  Gallbladder was severely inflamed.  It was connected omentum.  Was difficult to uncover the gallbladder.  The omentum was dissected off the gallbladder using blunt  dissection with the suction irrigator and electrocautery with the hook.  The gallbladder was finally able to be dissected free from its attachments to the omentum allowing the duodenum to fall away.  There was a perforation of the gallbladder with a gallstone eroding through the wall of the gallbladder near the infundibulum on the inferior aspect of the gallbladder wall, eroding into the omentum.  The infundibulum of the gallbladder was dissected free working laterally to medially.  The cystic duct and cystic artery were dissected free from surrounding connective tissue.  The infundibulum of the gallbladder was dissected off the cystic plate.  There was severe inflammation as I worked with the cystic duct and cystic artery.  I peeled the gallbladder off the gallbladder fossa to ensure there were only 2 structures entering the gallbladder.  These structures were clipped and divided.  A PDS Endoloop was placed on the structures.  The gallbladder was then removed from the gallbladder fossa.  This was done mostly bluntly, and with electrocautery.   The gallbladder was dissected off the cystic plate completely, placed in an endocatch bag and removed from the 12 mm subxiphoid port site.  The clips were inspected and appeared effective.  The cystic plate was inspected and hemostasis was obtained using electrocautery.  A suction irrigator was used to clean the operative field.  The stone scoop was used to remove large stones eroded out of the gallbladder.  Snow topical hemostatic agent was placed in the gallbladder fossa.  A 19 [place] JP drain was brought through the right upper quadrant port site and positioned in the gallbladder fossa continuing up and over the edge of the liver.  It was fixed to the skin with nylon suture.  Attention was turned to closure.  The 12 mm subxiphoid port site was closed using a 0-vicryl suture on a fascial suture passer.  The abdomen was desufflated.  The skin was closed using 4-0 monocryl  and dermabond.  All sponge and needle counts were correct at the conclusion of the case.    Louanna Raw, MD General, Bariatric, & Minimally Invasive Surgery [facility], [place]

## 2022-09-23 NOTE — Anesthesia Preprocedure Evaluation (Addendum)
Anesthesia Evaluation  Patient identified by MRN, date of birth, ID band Patient awake    Reviewed: Allergy & Precautions, NPO status , Patient's Chart, lab work & pertinent test results, reviewed documented beta blocker date and time   Airway Mallampati: II       Dental no notable dental hx. (+) Teeth Intact, Dental Advisory Given   Pulmonary sleep apnea and Continuous Positive Airway Pressure Ventilation , pneumonia, unresolved, former smoker Covid +   breath sounds clear to auscultation + decreased breath sounds      Cardiovascular hypertension, Pt. 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                Hemodynamically significant plaque >50% visualized in the  CCA.                The ECA appears >50% stenosed.   Left Carotid: Velocities in the left ICA are consistent with a 1-39%  stenosis.   Vertebrals: Bilateral vertebral arteries demonstrate antegrade flow.  Subclavians: Right subclavian artery flow was disturbed. Normal flow               hemodynamics were seen in the left subclavian artery.   Echo 04/11/22 1. Left ventricular ejection fraction, by estimation, is 60 to 65%. Left  ventricular ejection fraction by 3D volume is 60 %. The left ventricle has  normal function. The left ventricle has no regional wall motion  abnormalities. Left ventricular diastolic   parameters were normal.   2. Right ventricular systolic function is normal. The right ventricular  size is normal. Tricuspid regurgitation signal is inadequate for assessing  PA pressure.   3. The mitral valve is normal in structure. No evidence of mitral  valve  regurgitation. No evidence of mitral stenosis.   4. The aortic valve is tricuspid. Aortic valve regurgitation is not  visualized. Aortic valve sclerosis/calcification is present, without any  evidence of aortic stenosis.   5. The inferior vena cava is normal in size with greater than 50%  respiratory variability, suggesting right atrial pressure of 3 mmHg.      Neuro/Psych negative neurological ROS  negative psych ROS   GI/Hepatic Neg liver ROS,,,Cholelithiasis with Acute cholecystitis CT abdomen  09/21/22 There are multiple stones within the gallbladder which measure up to 1.5 cm. There is diffuse gallbladder wall thickening and pericholecystic inflammation. Pericholecystic fluid is identified and there is fluid extending along the right pericolic gutter. Fluid measures scratch set the fluid is mildly complex measuring 19 Hounsfield units, image 275/6. No signs of bile duct dilatation.    Endo/Other  Hyperlipidemia Gout  Renal/GU Renal Insufficiency and ARFRenal disease  negative genitourinary   Musculoskeletal  (+) Arthritis ,    Abdominal   Peds  Hematology  (+) Blood dyscrasia, anemia Eliquis therapy- last dose yesterday pm PT 19.4 INR 1.7   Anesthesia Other Findings Unintentional narcotic overdose yesterday resolved with Narcan  Reproductive/Obstetrics                              Anesthesia Physical Anesthesia Plan  ASA: 3 and emergent  Anesthesia Plan: General   Post-op Pain Management: Precedex,  Ofirmev IV (intra-op)* and Dilaudid IV   Induction: Intravenous, Rapid sequence and Cricoid pressure planned  PONV Risk Score and Plan: 4 or greater and Treatment may vary due to age or medical condition, Ondansetron and Dexamethasone  Airway Management Planned: Oral ETT  Additional Equipment: None  Intra-op Plan:   Post-operative Plan: Extubation in OR  Informed Consent: I have reviewed the patients History and  Physical, chart, labs and discussed the procedure including the risks, benefits and alternatives for the proposed anesthesia with the patient or authorized representative who has indicated his/her understanding and acceptance.     Dental advisory given  Plan Discussed with: CRNA and Anesthesiologist  Anesthesia Plan Comments:          Anesthesia Quick Evaluation

## 2022-09-23 NOTE — Anesthesia Postprocedure Evaluation (Deleted)
Anesthesia Post Note  Patient: Erik Perez  Procedure(s) Performed: LAPAROSCOPIC CHOLECYSTECTOMY (Abdomen)     Patient location during evaluation: PACU Anesthesia Type: General Level of consciousness: awake and alert and oriented Pain management: pain level controlled Vital Signs Assessment: post-procedure vital signs reviewed and stable Respiratory status: spontaneous breathing, nonlabored ventilation, respiratory function stable and patient connected to nasal cannula oxygen Cardiovascular status: blood pressure returned to baseline and stable Postop Assessment: no apparent nausea or vomiting Anesthetic complications: no   No notable events documented.  Last Vitals:  Vitals:   09/23/22 1305 09/23/22 1320  BP: 103/62 106/64  Pulse: (!) 105 (!) 104  Resp: 14 14  Temp: 37.3 C   SpO2: 94% 93%    Last Pain:  Vitals:   09/23/22 1305  TempSrc:   PainSc: 0-No pain                 Artesha Wemhoff A.

## 2022-09-23 NOTE — Progress Notes (Signed)
Patient received to unit post-op alert and oriented x4. Physical reassessment complete. Surgical sites and JP drain noted to abdomen-clean, dry, and intact. Patient c/o 5/10 abdominal pain. Scheduled Tylenol given. Patient repositioned in bed for comfort. Patient does not voice any questions or concerns at this time. Patient's sons at bedside. Bed lowered and call bell within reach.

## 2022-09-23 NOTE — Progress Notes (Signed)
Patient ID: Erik Perez, male   DOB: [birth year], 76 y.o.   MRN: [id number] Day of Surgery    Subjective: Some RUQ pain ROS negative except as listed above. Objective: Vital signs in last 24 hours: Temp:  [98.4 F (36.9 C)-99.8 F (37.7 C)] 98.6 F (37 C) (12/26 0716) Pulse Rate:  [71-100] 100 (12/26 0716) Resp:  [13-20] 18 (12/26 0716) BP: (120-148)/(54-83) 130/83 (12/26 0716) SpO2:  [90 %-98 %] 90 % (12/26 0716) Last BM Date : 09/18/22  Intake/Output from previous day: 12/25 0701 - 12/26 0700 In: 191.1 [IV Piggyback:191.1] Out: 150 [Urine:150] Intake/Output this shift: No intake/output data recorded.  General appearance: alert and cooperative Cardio: irregularly irregular rhythm GI: soft, tender RUQ Lungs some rhonchi  Lab Results: CBC  Recent Labs    09/22/22 0552 09/23/22 0338  WBC 6.7 8.6  HGB 8.9* 8.7*  HCT 25.5* 25.0*  PLT 146* 150    BMET Recent Labs    09/22/22 0552 09/23/22 0338  NA 132* 130*  K 4.6 4.6  CL 98 97*  CO2 21* 19*  GLUCOSE 98 90  BUN 38* 49*  CREATININE 3.79* 5.00*  CALCIUM 8.0* 7.9*    PT/INR Recent Labs    09/21/22 0810 09/22/22 0918  LABPROT 21.9* 19.4*  INR 1.9* 1.7*    ABG No results for input(s): "PHART", "HCO3" in the last 72 hours.  Invalid input(s): "PCO2", "PO2"  Studies/Results:   Anti-infectives: Anti-infectives (From admission, onward)    Start     Dose/Rate Route Frequency Ordered Stop   09/21/22 1400  piperacillin-tazobactam (ZOSYN) IVPB 3.375 g        3.375 g 12.5 mL/hr over 240 Minutes Intravenous Every 8 hours 09/21/22 1031     09/21/22 1015  molnupiravir EUA (LAGEVRIO) capsule 800 mg        4 capsule Oral 2 times daily 09/21/22 1000 09/26/22 0959   09/21/22 0830  piperacillin-tazobactam (ZOSYN) IVPB 3.375 g        3.375 g 100 mL/hr over 30 Minutes Intravenous  Once 09/21/22 4496 09/21/22 7591       Assessment/Plan: Cholecystitis -I recommended laparoscopic cholecystectomy.  We  have waited for the Eliquis to wear off.  It should be safe to proceed with surgery today.  We discussed the procedure of laparoscopic cholecystectomy.  We discussed the risk, benefits, and alternatives.  After full discussion all questions answered the patient granted consent to proceed.  We will proceed as scheduled.     ID - Zosyn IV, COVID per primary team   LOS: 2 days    Felicie Morn, MD   09/23/2022

## 2022-09-23 NOTE — Progress Notes (Signed)
Placed pt on cpap,

## 2022-09-23 NOTE — Progress Notes (Signed)
PHARMACY NOTE:  ANTIMICROBIAL RENAL DOSAGE ADJUSTMENT  Current antimicrobial regimen includes a mismatch between antimicrobial dosage and estimated renal function.  As per policy approved by the Pharmacy & Therapeutics and Medical Executive Committees, the antimicrobial dosage will be adjusted accordingly.  Current antimicrobial dosage:  Zosyn 3.375g IV q8 4h infusion  Indication: abd infection  Renal Function:  Estimated Creatinine Clearance: 15.2 mL/min (A) (by C-G formula based on SCr of 5 mg/dL (H)). '[]'$      On intermittent HD, scheduled: '[]'$      On CRRT    Antimicrobial dosage has been changed to:  zosyn 2.25g IV q6  Additional comments:   Onnie Boer, PharmD, BCIDP, AAHIVP, CPP Infectious Disease Pharmacist 09/23/2022 2:44 PM

## 2022-09-23 NOTE — Progress Notes (Signed)
PROGRESS NOTE                                                                                                                                                                                                             Patient Demographics:    Erik Perez, is a 76 y.o. male, DOB - 05/18/1946, UKG:[id number]  Outpatient Primary MD for the patient is Lurline Del, DO    LOS - 2  Admit date - 09/21/2022    Chief Complaint  Patient presents with   Abdominal Pain       Brief Narrative (HPI from H&P)    76 y.o. male with medical history significant of CAD, AAA, CKD, afib on Eliquis (last dose night), carotid stenosis, HTN, HLD, and OSA on CPAP presenting with abdominal pain. Patient reports that he has had mild SOB and fatigue since diagnosed with afib in October.  He became acutely ill with cough, congestion and was diagnosed with COVID on 12/20, started treatment with molnupiravir that day (completing 5 days today).  He also had a known AAA.  He started about 0600 with acute R-sided abdominal pain that radiated to his R shoulder.  The pain later spread across his mid abdomen to both flanks, him to the ER where workup was suggestive of acute cholecystitis, he was seen by general surgery and admitted by [facility].   Subjective:   Seen in bed denies any headache chest or abdominal pain, no shortness of breath, no orthopnea.   Assessment  & Plan :    Acute cholecystitis  - Patient in by [place] due for surgery 09/23/2022 as he was taking Eliquis with last dose being 09/20/2022, continue supportive care and antibiotics.  Bowel rest gentle hydration.   Unintentional narcotic overdose night of 09/22/2022.  Resolved after Narcan.  As needed Narcan on board.  Cautious use of narcotics.    COVID 19 pneumonia present on admission.  Finished his antiviral treatment yesterday, continue supportive care.  Total 10-day of isolation which is ending  09/27/2022.   Paroxysmal A-fib with [place] vas 2 score of greater than 4.  Eliquis on hold for surgery last dose 09/20/2022, continue beta-blocker and amiodarone, diltiazem as needed.  Acute on chronic diastolic CHF EF 76% on recent echocardiogram.  Lasix, continue beta-blocker, be careful with IV fluids.  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  Morning of 09/23/2022 renal failure significantly worse, obtaining urine electrolytes, since he is n.p.o. gentle IV fluids were started 5 AM on 09/23/2022, renal ultrasound bladder scan also ordered.  Nephrology consult requested.  HTN  - currently on beta-blocker and stable as needed IV hydralazine.  Monitor.  Discontinue Norvasc upon discharge as he is on Cardizem as well.   HLD  -Continue atorvastatin   OSA  -Continue CPAP nightly   Gout  -Continue allopurinol  Infrarenal abdominal aortic aneurysm measures 3.7 cm - incidental, defer to PCP for monitoring closely.  Beta-blocker if he can tolerate.  CTA with an indeterminate exophytic lesion along the inferior pole of L kidney - needs further evaluation as an outpatient with abdominal MRI, defer to PCP.       Condition - Extremely Guarded  Family Communication  :  wife bedside 09/22/22  Code Status :  Full  Consults  :  CCS, nephrology  PUD Prophylaxis : PPI   Procedures  :     Renal ultrasound ordered 09/23/2022   CT - 1. No evidence for thoracic or abdominal aortic dissection. 2. Gallstones with diffuse gallbladder wall thickening and pericholecystic inflammation. Findings are concerning for acute cholecystitis. There is small volume of free fluid which appears mildly complex measuring 19 Hounsfield units extending from the gallbladder along the right pericolic gutter and along  bilateral peritoneal reflections within the lower abdomen. 3. Wall thickening involving the descending portion of the duodenum adjacent to the gallbladder is likely reactive in the setting of acute cholecystitis. 4. Infrarenal abdominal aortic aneurysm measures 3.7 cm. Recommend follow-up ultrasound every 2 years. This recommendation follows ACR consensus guidelines: White Paper of the ACR Incidental Findings Committee II on Vascular Findings. J Am Coll Radiol 2013; 10:789-794. 5. Trace pleural fluid bilaterally. 6. Indeterminate exophytic lesion arising off the inferior pole of the left kidney. This may represent a hemorrhagic cyst or enhancing kidney lesion. This does not require emergent attention. When the patient is clinically stable and able to follow directions and hold their breath (preferably as an outpatient) further evaluation with dedicated abdominal MRI should be considered. 7.  Aortic Atherosclerosis      Disposition Plan  :    Status is: Inpatient  DVT Prophylaxis  :    SCDs Start: 09/21/22 0954   Lab Results  Component Value Date   PLT 150 09/23/2022    Diet :  Diet Order             Diet NPO time specified  Diet effective midnight                    Inpatient Medications  Scheduled Meds:  allopurinol  100 mg Oral BID   amiodarone  200 mg Oral Q0600   atorvastatin  40 mg Oral Daily   doxazosin  4 mg Oral Daily   metoprolol tartrate  75 mg Oral BID   molnupiravir EUA  4 capsule Oral BID   pantoprazole  40 mg Oral Daily   sodium zirconium cyclosilicate  10 g Oral Once   Continuous Infusions:  lactated ringers     piperacillin-tazobactam (ZOSYN)  IV 3.375 g (09/23/22 0625)   PRN Meds:.acetaminophen **OR** acetaminophen, albuterol, camphor-menthol **AND** hydrOXYzine,  diltiazem, hydrALAZINE, naLOXone (NARCAN)  injection, nitroGLYCERIN, ondansetron **OR** ondansetron (ZOFRAN) IV     Objective:   Vitals:   09/22/22 2048 09/22/22 2313 09/23/22 0600  09/23/22 0716  BP: (!) 133/57 (!) 126/54 120/61 130/83  Pulse: 90 86 71 100  Resp: '17 18 13 18  '$ Temp: 99.8 F (37.7 C) 99.1 F (37.3 C) 99 F (37.2 C) 98.6 F (37 C)  TempSrc: Oral Oral  Oral  SpO2: 95% 98% 95% 90%    Wt Readings from Last 3 Encounters:  09/12/22 104.3 kg  09/05/22 103.4 kg  09/04/22 107 kg     Intake/Output Summary (Last 24 hours) at 09/23/2022 0954 Last data filed at 09/23/2022 0057 Gross per 24 hour  Intake 191.13 ml  Output 150 ml  Net 41.13 ml     Physical Exam  Awake Alert, No new F.N deficits, Normal affect [place].AT,PERRAL Supple Neck, No JVD,   Symmetrical Chest wall movement, Good air movement bilaterally, no Rales this morning RRR,No Gallops, Rubs or new Murmurs,  +ve B.Sounds, abdomen mildly distended with some right upper quadrant tenderness No Cyanosis, Clubbing or edema        Data Review:    Recent Labs  Lab 09/21/22 0725 09/21/22 0810 09/22/22 0552 09/23/22 0338  WBC  --  4.5 6.7 8.6  HGB 10.2* 9.4* 8.9* 8.7*  HCT 30.0* 28.5* 25.5* 25.0*  PLT  --  157 146* 150  MCV  --  100.0 98.1 97.3  MCH  --  33.0 34.2* 33.9  MCHC  --  33.0 34.9 34.8  RDW  --  13.6 13.8 13.8  LYMPHSABS  --   --   --  0.7  MONOABS  --   --   --  0.7  EOSABS  --   --   --  0.0  BASOSABS  --   --   --  0.0    Recent Labs  Lab 09/21/22 0725 09/21/22 0810 09/22/22 0552 09/22/22 0918 09/23/22 0338  NA 135 132* 132*  --  130*  K 4.2 4.1 4.6  --  4.6  CL 102 99 98  --  97*  CO2  --  21* 21*  --  19*  ANIONGAP  --  12 13  --  14  GLUCOSE 143* 145* 98  --  90  BUN 28* 25* 38*  --  49*  CREATININE 2.60* 2.66* 3.79*  --  5.00*  AST  --  17 16  --  19  ALT  --  16 16  --  18  ALKPHOS  --  36* 35*  --  40  BILITOT  --  1.2 1.1  --  1.0  ALBUMIN  --  2.6* 2.3*  --  2.3*  CRP  --   --   --   --  36.8*  PROCALCITON  --   --  7.10  --  8.25  INR  --  1.9*  --  1.7*  --   BNP  --   --  293.1*  --  370.4*  MG  --   --  1.7  --  1.8  CALCIUM  --   8.2* 8.0*  --  7.9*        Micro Results Recent Results (from the past 240 hour(s))  MRSA Next Gen by PCR, Nasal     Status: Abnormal   Collection Time: 09/22/22  6:00 AM   Specimen: Nasal Mucosa; Nasal Swab  Result Value Ref  Range Status   MRSA by PCR Next Gen DETECTED (A) NOT DETECTED Final    Comment: RVB NOTIFIED ADELIDE BUTLER ON 12.25.23 AT 0944 BY EM (NOTE) The GeneXpert MRSA Assay (FDA approved for NASAL specimens only), is one component of a comprehensive MRSA colonization surveillance program. It is not intended to diagnose MRSA infection nor to guide or monitor treatment for MRSA infections. Test performance is not FDA approved in patients less than 35 years old. Performed at [facility], [place] [address] [place], [place] [address]     Radiology Reports DG Chest [place] 1 View  Result Date: 09/22/2022 CLINICAL DATA:  Shortness of breath EXAM: PORTABLE CHEST 1 VIEW COMPARISON:  Radiograph 09/11/2022, CT 09/21/2022 FINDINGS: Unchanged cardiomediastinal silhouette with median sternotomy and postsurgical changes of CABG. There are right medial basilar airspace opacities. No evidence of pneumothorax or large effusion. No acute osseous abnormality. Cervical spine fusion hardware noted. IMPRESSION: Right medial basilar airspace opacities, could be atelectasis or infection. Recommend continued follow-up. Electronically Signed   By: Maurine Simmering M.D.   On: 09/22/2022 09:45   CT Angio Chest/Abd/Pel for Dissection W and/or Wo Contrast  Result Date: 09/21/2022 CLINICAL DATA:  Rule out acute aortic syndrome. Severe abdominal pain EXAM: CT ANGIOGRAPHY CHEST, ABDOMEN AND PELVIS TECHNIQUE: Non-contrast CT of the chest was initially obtained. Multidetector CT imaging through the chest, abdomen and pelvis was performed using the standard protocol during bolus administration of intravenous contrast. Multiplanar reconstructed images and MIPs were obtained and reviewed to evaluate the  vascular anatomy. RADIATION DOSE REDUCTION: This exam was performed according to the departmental dose-optimization program which includes automated exposure control, adjustment of the mA and/or kV according to patient size and/or use of iterative reconstruction technique. CONTRAST:  105m OMNIPAQUE IOHEXOL 350 MG/ML SOLN COMPARISON:  Abdominal aortic ultrasound 07/28/2018 FINDINGS: CTA CHEST FINDINGS Cardiovascular: Preferential opacification of the thoracic aorta. No evidence of thoracic aortic aneurysm or dissection. Normal heart size. No pericardial effusion. Aortic atherosclerosis. Previous CABG procedure. Mediastinum/Nodes: Thyroid gland, trachea appear normal. There is mild diffuse circumferential wall thickening of the esophagus which may reflect underlying esophagitis. No enlarged axillary, supraclavicular, mediastinal, or hilar lymph nodes. Lungs/Pleura: Trace pleural fluid identified bilaterally. Mild atelectasis noted in the posterior right base. No suspicious pulmonary nodule or mass. Musculoskeletal: No acute abnormality.  Previous median sternotomy. Review of the MIP images confirms the above findings. CTA ABDOMEN AND PELVIS FINDINGS VASCULAR Aorta: Infrarenal abdominal aortic aneurysm measures 3.7 cm, image 199/6. Aortic atherosclerotic calcifications. No signs of dissection. No significant stenosis. Celiac: Patent. 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 Renals: Both renal arteries are patent without evidence of aneurysm, dissection, vasculitis, fibromuscular dysplasia or significant stenosis. IMA: Patent without evidence of aneurysm, dissection, vasculitis or significant stenosis. Inflow: Patent without evidence of aneurysm, dissection, vasculitis or significant stenosis. Veins: No obvious venous abnormality within the  limitations of this arterial phase study. Review of the MIP images confirms the above findings. NON-VASCULAR Hepatobiliary: Liver cysts noted.  No suspicious liver lesion. There are multiple stones within the gallbladder which measure up to 1.5 cm. There is diffuse gallbladder wall thickening and pericholecystic inflammation. Pericholecystic fluid is identified and there is fluid extending along the right pericolic gutter. Fluid measures scratch set the fluid is mildly complex measuring 19 Hounsfield units, image 275/6. No signs of bile duct dilatation. Pancreas: Unremarkable. No  pancreatic ductal dilatation or surrounding inflammatory changes. Spleen: Normal in size without focal abnormality. Adrenals/Urinary Tract: There is a nodule in the right adrenal gland measuring 2 cm and 1.5 Hounsfield units. This is compatible with a benign adenoma. No follow-up imaging recommended. Bilateral kidney cysts are identified. Arising off the inferior pole of the left kidney is an exophytic lesion measuring 1.7 cm and 85 Hounsfield units. This does not meet CT criteria for benign kidney lesion. No signs of nephrolithiasis or hydronephrosis. Urinary bladder is unremarkable. Stomach/Bowel: Stomach is normal. There is wall thickening involving the descending portion of the duodenum adjacent to the gallbladder. The appendix is visualized and appears normal. No pathologic dilatation of the large or small bowel loops. Lymphatic: No enlarged abdominopelvic adenopathy. Reproductive: Moderate prostate gland enlargement. Other: No discrete fluid collection identified. No signs of pneumoperitoneum. Musculoskeletal: No acute or significant osseous findings. Review of the MIP images confirms the above findings. IMPRESSION: 1. No evidence for thoracic or abdominal aortic dissection. 2. Gallstones with diffuse gallbladder wall thickening and pericholecystic inflammation. Findings are concerning for acute cholecystitis. There is small volume of  free fluid which appears mildly complex measuring 19 Hounsfield units extending from the gallbladder along the right pericolic gutter and along bilateral peritoneal reflections within the lower abdomen. 3. Wall thickening involving the descending portion of the duodenum adjacent to the gallbladder is likely reactive in the setting of acute cholecystitis. 4. Infrarenal abdominal aortic aneurysm measures 3.7 cm. Recommend follow-up ultrasound every 2 years. This recommendation follows ACR consensus guidelines: White Paper of the ACR Incidental Findings Committee II on Vascular Findings. J Am Coll Radiol 2013; 10:789-794. 5. Trace pleural fluid bilaterally. 6. Indeterminate exophytic lesion arising off the inferior pole of the left kidney. This may represent a hemorrhagic cyst or enhancing kidney lesion. This does not require emergent attention. When the patient is clinically stable and able to follow directions and hold their breath (preferably as an outpatient) further evaluation with dedicated abdominal MRI should be considered. 7.  Aortic Atherosclerosis (ICD10-I70.0). Electronically Signed   By: Kerby Moors M.D.   On: 09/21/2022 08:15      Signature  -   Lala Lund M.D on 09/23/2022 at 9:54 AM   -  To page go to [email or web address]

## 2022-09-23 NOTE — Evaluation (Signed)
Occupational Therapy Evaluation Patient Details Name: Erik Perez MRN: [id number] DOB: [birth year] Today's Date: 09/23/2022   History of Present Illness Pt is a 76 y.o. male admitted 09/21/22 with abdominal pain. Workup revealed acute cholecystitis. Plan for lap chole 12/26. Known (+) COVID-19 dx 09/17/22. Other PMH includes CAD, AAA, CKD, CKD, afibn on Eliquis, HTN, HLD, OSA on CPAP.   Clinical Impression   PTA, pt ind with ADLs, reports use of shower seat as needed, lives with spouse in 2 story home with a flight of steps to enter. Pt currenlty needing min guard-mod A for ADLs, min guard for bed mobility, and min guard for transfers with RW. Pt with mild dizziness initially with bed mobility and standing, SpO2 above 90% throughout session on RA. Pt presenting with impairments listed below, will follow acutely. Recommend HHOT at d/c      Recommendations for follow up therapy are one component of a multi-disciplinary discharge planning process, led by the attending physician.  Recommendations may be updated based on patient status, additional functional criteria and insurance authorization.   Follow Up Recommendations  Home health OT     Assistance Recommended at Discharge Set up Supervision/Assistance  Patient can return home with the following A little help with walking and/or transfers;A little help with bathing/dressing/bathroom;Assistance with cooking/housework    Functional Status Assessment  Patient has had a recent decline in their functional status and demonstrates the ability to make significant improvements in function in a reasonable and predictable amount of time.  Equipment Recommendations  None recommended by OT (pt has all needed DME)    Recommendations for Other Services PT consult     Precautions / Restrictions Precautions Precautions: Fall;Other (comment) Precaution Comments: watch SpO2 (does not wear baseline) Restrictions Weight Bearing Restrictions: No       Mobility Bed Mobility Overal bed mobility: Needs Assistance Bed Mobility: Sit to Supine     Supine to sit: Min guard Sit to supine: Min guard        Transfers Overall transfer level: Needs assistance Equipment used: Rolling walker (2 wheels) Transfers: Sit to/from Stand Sit to Stand: Min guard                  Balance Overall balance assessment: Needs assistance Sitting-balance support: No upper extremity supported, Feet supported Sitting balance-Leahy Scale: Good     Standing balance support: No upper extremity supported, During functional activity Standing balance-Leahy Scale: Fair                             ADL either performed or assessed with clinical judgement   ADL Overall ADL's : Needs assistance/impaired Eating/Feeding: NPO   Grooming: Min guard   Upper Body Bathing: Minimal assistance   Lower Body Bathing: Moderate assistance;Minimal assistance   Upper Body Dressing : Minimal assistance   Lower Body Dressing: Minimal assistance   Toilet Transfer: Min guard;Rolling walker (2 wheels);Ambulation;Regular Museum/gallery exhibitions officer and Hygiene: Min guard       Functional mobility during ADLs: Min guard;Rolling walker (2 wheels)       Vision   Vision Assessment?: No apparent visual deficits     Perception Perception Perception Tested?: No   Praxis Praxis Praxis tested?: Not tested    Pertinent Vitals/Pain Pain Assessment Pain Assessment: Faces Pain Score: 4  Faces Pain Scale: Hurts little more Pain Location: abdomen Pain Descriptors / Indicators: Discomfort Pain Intervention(s): Limited activity within patient's  tolerance, Monitored during session, Repositioned     Hand Dominance     Extremity/Trunk Assessment Upper Extremity Assessment Upper Extremity Assessment: Overall WFL for tasks assessed   Lower Extremity Assessment Lower Extremity Assessment: Defer to PT evaluation   Cervical /  Trunk Assessment Cervical / Trunk Assessment: Normal   Communication Communication Communication: No difficulties   Cognition Arousal/Alertness: Awake/alert Behavior During Therapy: WFL for tasks assessed/performed Overall Cognitive Status: Within Functional Limits for tasks assessed                                 General Comments: some slow processing noted, cues for task initiation     General Comments  SpO2 >90% on RA during session, spouse present    Exercises     Shoulder Instructions      Home Living Family/patient expects to be discharged to:: Private residence Living Arrangements: Spouse/significant other Available Help at Discharge: Family;Available 24 hours/day Type of Home: House Home Access: Stairs to enter [employer] of Steps: flight Entrance Stairs-Rails: Right Home Layout: Two level;Able to live on main level with bedroom/bathroom     Bathroom Shower/Tub: Tub/shower unit;Walk-in shower   Bathroom Toilet: Handicapped height     Home Equipment: Conservation officer, nature (2 wheels);Shower seat;Grab bars - toilet;Grab bars - tub/shower          Prior Functioning/Environment Prior Level of Function : Independent/Modified Independent;Driving             Mobility Comments: independent without DME; has had some challenges with stairs due to SOB. drives. retired from work ADLs Comments: indep with ADLs, stands to shower (but has seat if needed)        OT Problem List: Decreased strength;Decreased range of motion;Decreased activity tolerance;Impaired balance (sitting and/or standing)      OT Treatment/Interventions: Self-care/ADL training;Energy conservation;Therapeutic exercise;DME and/or AE instruction;Therapeutic activities;Patient/family education;Balance training    OT Goals(Current goals can be found in the care plan section) Acute Rehab OT Goals Patient Stated Goal: none stated OT Goal Formulation: With patient Time For Goal  Achievement: 10/07/22 Potential to Achieve Goals: Good ADL Goals Pt Will Perform Lower Body Dressing: with supervision;sitting/lateral leans;sit to/from stand Pt Will Transfer to Toilet: with supervision;ambulating;regular height toilet Pt Will Perform Tub/Shower Transfer: Shower transfer;with supervision;ambulating;shower seat  OT Frequency: Min 2X/week    Co-evaluation              AM-PAC OT "6 Clicks" Daily Activity     Outcome Measure Help from another person eating meals?: None Help from another person taking care of personal grooming?: A Little Help from another person toileting, which includes using toliet, bedpan, or urinal?: A Little Help from another person bathing (including washing, rinsing, drying)?: A Lot Help from another person to put on and taking off regular upper body clothing?: A Little Help from another person to put on and taking off regular lower body clothing?: A Little 6 Click Score: 18   End of Session Equipment Utilized During Treatment: Rolling walker (2 wheels);Gait belt Nurse Communication: Mobility status  Activity Tolerance: Patient tolerated treatment well Patient left: in bed;with call bell/phone within reach;with bed alarm set;with family/visitor present  OT Visit Diagnosis: Unsteadiness on feet (R26.81);Other abnormalities of gait and mobility (R26.89);Muscle weakness (generalized) (M62.81)                Time: 9563-8756 OT Time Calculation (min): 28 min Charges:  OT General Charges $OT Visit:  1 Visit OT Evaluation $OT Eval Moderate Complexity: 1 Mod OT Treatments $Self Care/Home Management : 8-22 mins  Renaye Rakers, OTD, OTR/L SecureChat Preferred Acute Rehab [phone]  Ulla Gallo 09/23/2022, 8:56 AM

## 2022-09-23 NOTE — Anesthesia Postprocedure Evaluation (Signed)
Anesthesia Post Note  Patient: Erik Perez  Procedure(s) Performed: LAPAROSCOPIC CHOLECYSTECTOMY (Abdomen)     Patient location during evaluation: PACU Anesthesia Type: General Level of consciousness: awake and alert and oriented Pain management: pain level controlled Vital Signs Assessment: post-procedure vital signs reviewed and stable Respiratory status: spontaneous breathing, nonlabored ventilation, respiratory function stable and patient connected to nasal cannula oxygen Cardiovascular status: blood pressure returned to baseline and stable Postop Assessment: no apparent nausea or vomiting Anesthetic complications: no   No notable events documented.  Last Vitals:  Vitals:   09/23/22 1320 09/23/22 1335  BP: 106/64 (!) 105/59  Pulse: (!) 104 (!) 106  Resp: 14 15  Temp:  37.2 C  SpO2: 93% 99%    Last Pain:  Vitals:   09/23/22 1335  TempSrc:   PainSc: 0-No pain                 Junetta Hearn A.

## 2022-09-23 NOTE — Consult Note (Signed)
[facility]  INPATIENT CONSULTATION  Reason for Consultation: AKI on CKD  Requesting Provider: Dr. Lala Lund  HPI: Erik Perez is an 76 y.o. male with CAD, AAA, A fib on eliquis, HTN, HLD, OSA on CPAP currently admitted with acute cholecystitis who is seen for AKI on CKD.   Presented 12/24 - had COVID since prior Sunday, tx with molnupiravir.  Developed severe abd pain and CTA done r/o dissection but showed acute cholecystitis.  Tx with pip/tazo. Underwent lap chole today - was perforated.  No hypotension in OR record.   12/26 renal US R 10.4, L 10.6, no obstruction; mildly thin cortices. No UA yet.  UOP 162m yesterday, today appears none.  Does not have foley.   2 sons bedside; pt groggy from anesthesia and not providing much history. Sounds like he was pushing fluids at home PTA and not thought to be dehydrated.    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  Medications Prior to Admission  Medication Sig Dispense Refill   allopurinol (ZYLOPRIM) 100 MG tablet Take 100 mg by mouth 2 (two) times daily.     amiodarone (PACERONE) 200 MG tablet Take 200 mg by mouth 2 (two)  times daily. Through Dec. 15th and then goes to once daily     amLODipine (NORVASC) 5 MG tablet Take 5 mg by mouth 2 (two) times daily.     apixaban (ELIQUIS) 5 MG TABS tablet Take 1 tablet (5 mg total) by mouth 2 (two) times daily. 60 tablet 3   atorvastatin (LIPITOR) 40 MG tablet Take 40 mg by mouth daily.     calcium gluconate 500 MG tablet Take 1 tablet by mouth 3 (three) times daily.     Cholecalciferol (VITAMIN D) 2000 UNITS tablet Take 2,000 Units by mouth 2 (two) times daily.      diltiazem (CARDIZEM) 30 MG tablet Take 1 tablet (30 mg total) by mouth 2 (two) times daily as needed (symptomatic atrial fibrillation lasting longer than 5 minutes). 30 tablet 0   doxazosin (CARDURA) 4 MG tablet Take 4 mg by mouth daily.     fexofenadine (ALLEGRA) 180 MG tablet Take 180 mg by mouth daily.     furosemide (LASIX) 20 MG tablet Take 20 mg by mouth as needed for fluid or edema.     hydrochlorothiazide (HYDRODIURIL) 25 MG tablet Take 12.5 mg by mouth daily.     LAGEVRIO 200 MG CAPS capsule Take 4 capsules by mouth 2 (two) times daily.     lisinopril (PRINIVIL,ZESTRIL) 40 MG tablet Take 40 mg  by mouth daily.     metoprolol tartrate 75 MG TABS Take 75 mg by mouth 2 (two) times daily. 30 tablet 2   Multiple Vitamin (MULTIVITAMIN) tablet Take 1 tablet by mouth daily.     nitroGLYCERIN (NITROSTAT) 0.4 MG SL tablet Place 1 tablet (0.4 mg total) under the tongue every 5 (five) minutes as needed for chest pain. 25 tablet 1   Omega-3 Fatty Acids (FISH OIL) 1200 MG CAPS Take 1,200 mg by mouth 2 (two) times daily.     SILDENAFIL CITRATE PO Take 100 mg by mouth daily as needed for erectile dysfunction.     Cyanocobalamin (B-12 IJ) Take one (1) injection into the muscle once a month.      ALLERGIES:   Allergies  Allergen Reactions   Colchicine Other (See Comments)   Diclofenac Sodium     Chest Pain   Gabapentin     Increased sedation    Niaspan [Niacin Er]     Involuntary facial flushing    FAM  HX: Family History  Problem Relation Age of Onset   Heart disease Mother    Heart disease Father    Heart disease Brother     Social History:   reports that he quit smoking about 39 years ago. His smoking use included cigarettes. He started smoking about 62 years ago. He has a 7.59 pack-year smoking history. He has never used smokeless tobacco. He reports current alcohol use. He reports that he does not use drugs.  ROS: 12 system ROS neg except per HPI.   Blood pressure 116/63, pulse (!) 110, temperature 98.2 F (36.8 C), temperature source Oral, resp. rate (!) 9, SpO2 95 %. PHYSICAL EXAM: Gen: obese man who is drowsy after anesthesia  Eyes: aniceteric ENT: MM dry CV:  irreg, on my exam in the low 100s Abd:  mild TTP, soft s/p lap chole Lungs: occ dry cough, a few scattered ant wheezes, no rales laterally GU: no foley Extr: no edema Neuro: drowsy but arousable   Results for orders placed or performed during the hospital encounter of 09/21/22 (from the past 48 hour(s))  Brain natriuretic peptide     Status: Abnormal   Collection Time: 09/22/22  5:52 AM  Result Value Ref Range   B Natriuretic Peptide 293.1 (H) 0.0 - 100.0 pg/mL    Comment: Performed at [facility], [address], [place], [place] [address]  CBC     Status: Abnormal   Collection Time: 09/22/22  5:52 AM  Result Value Ref Range   WBC 6.7 4.0 - 10.5 K/uL   RBC 2.60 (L) 4.22 - 5.81 MIL/uL   Hemoglobin 8.9 (L) 13.0 - 17.0 g/dL   HCT 25.5 (L) 39.0 - 52.0 %   MCV 98.1 80.0 - 100.0 fL   MCH 34.2 (H) 26.0 - 34.0 pg   MCHC 34.9 30.0 - 36.0 g/dL   RDW 13.8 11.5 - 15.5 %   Platelets 146 (L) 150 - 400 K/uL   nRBC 0.0 0.0 - 0.2 %    Comment: Performed at [facility], [place] [address] [place], [place] [address]  Comprehensive metabolic panel     Status: Abnormal   Collection Time: 09/22/22  5:52 AM  Result Value Ref Range   Sodium 132 (L) 135 - 145 mmol/L   Potassium 4.6 3.5 - 5.1 mmol/L   Chloride 98  98 - 111 mmol/L   CO2 21 (L) 22 - 32 mmol/L   Glucose, Bld  98 70 - 99 mg/dL    Comment: Glucose reference range applies only to samples taken after fasting for at least 8 hours.   BUN 38 (H) 8 - 23 mg/dL   Creatinine, Ser 3.79 (H) 0.61 - 1.24 mg/dL    Comment: DELTA CHECK NOTED   Calcium 8.0 (L) 8.9 - 10.3 mg/dL   Total Protein 5.6 (L) 6.5 - 8.1 g/dL   Albumin 2.3 (L) 3.5 - 5.0 g/dL   AST 16 15 - 41 U/L   ALT 16 0 - 44 U/L   Alkaline Phosphatase 35 (L) 38 - 126 U/L   Total Bilirubin 1.1 0.3 - 1.2 mg/dL   GFR, Estimated 16 (L) >60 mL/min    Comment: (NOTE) Calculated using the CKD-EPI Creatinine Equation (2021)    Anion gap 13 5 - 15    Comment: Performed at [facility] [address], [place], [place] [address]  Magnesium     Status: None   Collection Time: 09/22/22  5:52 AM  Result Value Ref Range   Magnesium 1.7 1.7 - 2.4 mg/dL    Comment: Performed at [facility] [address] [place],  [address]  Procalcitonin - Baseline     Status: None   Collection Time: 09/22/22  5:52 AM  Result Value Ref Range   Procalcitonin 7.10 ng/mL    Comment:        Interpretation: PCT > 2 ng/mL: Systemic infection (sepsis) is likely, unless other causes are known. (NOTE)       Sepsis PCT Algorithm           Lower Respiratory Tract                                      Infection PCT Algorithm    ----------------------------     ----------------------------         PCT < 0.25 ng/mL                PCT < 0.10 ng/mL          Strongly encourage             Strongly discourage   discontinuation of antibiotics    initiation of antibiotics    ----------------------------     -----------------------------       PCT 0.25 - 0.50 ng/mL            PCT 0.10 - 0.25 ng/mL               OR       >80% decrease in PCT            Discourage initiation of                                            antibiotics      Encourage discontinuation           of antibiotics     ----------------------------     -----------------------------         PCT >= 0.50 ng/mL              PCT 0.26 - 0.50 ng/mL               AND       <80% decrease in  PCT              Encourage initiation of                                             antibiotics       Encourage continuation           of antibiotics    ----------------------------     -----------------------------        PCT >= 0.50 ng/mL                  PCT > 0.50 ng/mL               AND         increase in PCT                  Strongly encourage                                      initiation of antibiotics    Strongly encourage escalation           of antibiotics                                     -----------------------------                                           PCT <= 0.25 ng/mL                                                 OR                                        > 80% decrease in PCT                                      Discontinue / Do not initiate                                             antibiotics  Performed at [facility], [place] [address], [place], [place] [address]   MRSA Next Gen by PCR, Nasal     Status: Abnormal   Collection Time: 09/22/22  6:00 AM   Specimen: Nasal Mucosa; Nasal Swab  Result Value Ref Range   MRSA by PCR Next Gen DETECTED (A) NOT DETECTED    Comment: [facility] NOTIFIED ADELIDE BUTLER ON 12.25.23 AT 0944 BY EM (NOTE) The GeneXpert MRSA Assay (FDA approved for NASAL specimens only), is one component of a comprehensive MRSA colonization surveillance program. It is not intended to diagnose MRSA infection nor to guide or monitor treatment for MRSA infections.  Test performance is not FDA approved in patients less than 66 years old. Performed at [facility], [place] [address], [place], [place] [address]   Protime-INR     Status: Abnormal   Collection Time: 09/22/22  9:18 AM  Result Value Ref Range   Prothrombin Time 19.4 (H) 11.4 - 15.2 seconds   INR 1.7 (H) 0.8 -  1.2    Comment: (NOTE) INR goal varies based on device and disease states. Performed at [facility], [place] [address], [place], [place] [address]   Glucose, capillary     Status: Abnormal   Collection Time: 09/22/22 10:15 PM  Result Value Ref Range   Glucose-Capillary 115 (H) 70 - 99 mg/dL    Comment: Glucose reference range applies only to samples taken after fasting for at least 8 hours.  Magnesium     Status: None   Collection Time: 09/23/22  3:38 AM  Result Value Ref Range   Magnesium 1.8 1.7 - 2.4 mg/dL    Comment: Performed at [facility] [address], [place], [place] [address]  Comprehensive metabolic panel     Status: Abnormal   Collection Time: 09/23/22  3:38 AM  Result Value Ref Range   Sodium 130 (L) 135 - 145 mmol/L   Potassium 4.6 3.5 - 5.1 mmol/L   Chloride 97 (L) 98 - 111 mmol/L   CO2 19 (L) 22 - 32 mmol/L   Glucose, Bld 90 70 - 99 mg/dL    Comment: Glucose reference range applies only to samples taken after fasting for at least 8 hours.   BUN 49 (H) 8 - 23 mg/dL   Creatinine, Ser 5.00 (H) 0.61 - 1.24 mg/dL   Calcium 7.9 (L) 8.9 - 10.3 mg/dL   Total Protein 5.9 (L) 6.5 - 8.1 g/dL   Albumin 2.3 (L) 3.5 - 5.0 g/dL   AST 19 15 - 41 U/L   ALT 18 0 - 44 U/L   Alkaline Phosphatase 40 38 - 126 U/L   Total Bilirubin 1.0 0.3 - 1.2 mg/dL   GFR, Estimated 11 (L) >60 mL/min    Comment: (NOTE) Calculated using the CKD-EPI Creatinine Equation (2021)    Anion gap 14 5 - 15    Comment: Performed at [facility], [place] [address], [place], [place] [address]  CBC with Differential/Platelet     Status: Abnormal   Collection Time: 09/23/22  3:38 AM  Result Value Ref Range   WBC 8.6 4.0 - 10.5 K/uL   RBC 2.57 (L) 4.22 - 5.81 MIL/uL   Hemoglobin 8.7 (L) 13.0 - 17.0 g/dL   HCT 25.0 (L) 39.0 - 52.0 %   MCV 97.3 80.0 - 100.0 fL   MCH 33.9 26.0 - 34.0 pg   MCHC 34.8 30.0 - 36.0 g/dL   RDW 13.8 11.5 - 15.5 %   Platelets 150 150 - 400 K/uL   nRBC 0.0 0.0 -  0.2 %   Neutrophils Relative % 83 %   Neutro Abs 7.3 1.7 - 7.7 K/uL   Lymphocytes Relative 8 %   Lymphs Abs 0.7 0.7 - 4.0 K/uL   Monocytes Relative 8 %   Monocytes Absolute 0.7 0.1 - 1.0 K/uL   Eosinophils Relative 0 %   Eosinophils Absolute 0.0 0.0 - 0.5 K/uL   Basophils Relative 0 %   Basophils Absolute 0.0 0.0 - 0.1 K/uL   Immature Granulocytes 1 %   Abs Immature Granulocytes 0.06 0.00 -  0.07 K/uL    Comment: Performed at [facility], [place] [address], [place], [place] [address]  Brain natriuretic peptide     Status: Abnormal   Collection Time: 09/23/22  3:38 AM  Result Value Ref Range   B Natriuretic Peptide 370.4 (H) 0.0 - 100.0 pg/mL    Comment: Performed at [facility] [address] [place], [place] [address]  C-reactive protein     Status: Abnormal   Collection Time: 09/23/22  3:38 AM  Result Value Ref Range   CRP 36.8 (H) <1.0 mg/dL    Comment: Performed at [facility] [address], [place], [place] [address]  Procalcitonin     Status: None   Collection Time: 09/23/22  3:38 AM  Result Value Ref Range   Procalcitonin 8.25 ng/mL    Comment:        Interpretation: PCT > 2 ng/mL: Systemic infection (sepsis) is likely, unless other causes are known. (NOTE)       Sepsis PCT Algorithm           Lower Respiratory Tract                                      Infection PCT Algorithm    ----------------------------     ----------------------------         PCT < 0.25 ng/mL                PCT < 0.10 ng/mL          Strongly encourage             Strongly discourage   discontinuation of antibiotics    initiation of antibiotics    ----------------------------     -----------------------------       PCT 0.25 - 0.50 ng/mL            PCT 0.10 - 0.25 ng/mL               OR       >80% decrease in PCT            Discourage initiation of                                            antibiotics      Encourage discontinuation           of antibiotics     ----------------------------     -----------------------------         PCT >= 0.50 ng/mL              PCT 0.26 - 0.50 ng/mL               AND       <80% decrease in PCT              Encourage initiation of                                             antibiotics       Encourage continuation           of antibiotics    ----------------------------     -----------------------------  PCT >= 0.50 ng/mL                  PCT > 0.50 ng/mL               AND         increase in PCT                  Strongly encourage                                      initiation of antibiotics    Strongly encourage escalation           of antibiotics                                     -----------------------------                                           PCT <= 0.25 ng/mL                                                 OR                                        > 80% decrease in PCT                                      Discontinue / Do not initiate                                             antibiotics  Performed at [facility], [address], [place], [place] [address]   Uric acid     Status: None   Collection Time: 09/23/22  3:38 AM  Result Value Ref Range   Uric Acid, Serum 5.8 3.7 - 8.6 mg/dL    Comment: Performed at [facility] [address], [place], [place] [address]    US RENAL  Result Date: 09/23/2022 CLINICAL DATA:  Acute kidney injury EXAM: RENAL / URINARY TRACT ULTRASOUND COMPLETE COMPARISON:  09/21/2022 FINDINGS: Right Kidney: Renal measurements: 10.4 x 5.8 x 6.5 cm = volume: 202.6. mL. Echogenicity within normal limits. Mild renal cortical thinning. No solid mass or hydronephrosis visualized. Right interpolar renal cyst measuring 3.5 cm. 5.6 cm right inferior pole renal cyst. Left Kidney: Renal measurements: 10.6 x 5 x 5.3 cm = volume: 146.5 mL. Echogenicity within normal limits. Mild renal cortical thinning. No solid mass or hydronephrosis visualized. 2 cm left  inferior pole renal cyst. Bladder: Appears normal for degree of bladder distention. Other: None. IMPRESSION: 1. No obstructive uropathy. 2. Mild bilateral renal cortical thinning as can be seen with medical renal disease. 3. Bilateral renal cysts. Electronically Signed   By: Kathreen Devoid  M.D.   On: 09/23/2022 11:35   DG Chest Port 1 View  Result Date: 09/22/2022 CLINICAL DATA:  Shortness of breath EXAM: PORTABLE CHEST 1 VIEW COMPARISON:  Radiograph 09/11/2022, CT 09/21/2022 FINDINGS: Unchanged cardiomediastinal silhouette with median sternotomy and postsurgical changes of CABG. There are right medial basilar airspace opacities. No evidence of pneumothorax or large effusion. No acute osseous abnormality. Cervical spine fusion hardware noted. IMPRESSION: Right medial basilar airspace opacities, could be atelectasis or infection. Recommend continued follow-up. Electronically Signed   By: Maurine Simmering M.D.   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 Cira Servant.  Baseline in the mid to high 2s lately now with acute worsening to 5.  Renal US ok.  Rec'd IV contrast - suspect this is contrast nephropathy +/- ATN in setting of acute cholecystitis.  Currently ~ anuric but no clear indications for RRT.  Certainly at risk in the next 24-48h, discussed with family/pt.  Will keep him NPO pMN in case needs catheter placed tomorrow.   Agree with continued hydration at this time: LR 100/hr.  Will add bladder scan. Follow I/Os, daily labs, avoid nephrotoxins.   **acute cholecystitis:  s/p lap chole today, on pip/tazo  **COVID infection: mild course s/p antiviral; isolation ends 12/30.  **A fib:  on outpt amio, BB.  Eliquis on hold s/p surgery.   **HTN: BPs acceptable currently  **OSA on CPAP.   Justin Mend 09/23/2022, 3:18 PM

## 2022-09-23 NOTE — Discharge Instructions (Signed)
[facility] LAPAROSCOPIC SURGERY: POST OP INSTRUCTIONS Always review your discharge instruction sheet given to you by the facility where your surgery was performed. IF YOU HAVE DISABILITY OR FAMILY LEAVE FORMS, YOU MUST BRING THEM TO THE OFFICE FOR PROCESSING.   DO NOT GIVE THEM TO YOUR DOCTOR.  PAIN CONTROL  First take acetaminophen (Tylenol) AND/or ibuprofen (Advil) to control your pain after surgery.  Follow directions on package.  Taking acetaminophen (Tylenol) and/or ibuprofen (Advil) regularly after surgery will help to control your pain and lower the amount of prescription pain medication you may need.  You should not take more than 3,000 mg (3 grams) of acetaminophen (Tylenol) in 24 hours.  You should not take ibuprofen (Advil), aleve, motrin, naprosyn or other NSAIDS if you have a history of stomach ulcers or chronic kidney disease.  A prescription for pain medication may be given to you upon discharge.  Take your pain medication as prescribed, if you still have uncontrolled pain after taking acetaminophen (Tylenol) or ibuprofen (Advil). Use ice packs to help control pain. If you need a refill on your pain medication, please contact your pharmacy.  They will contact our office to request authorization. Prescriptions will not be filled after 5pm or on week-ends.  HOME MEDICATIONS Take your usually prescribed medications unless otherwise directed.  DIET You should follow a light diet the first few days after arrival home.  Be sure to include lots of fluids daily. Avoid fatty, fried foods.   CONSTIPATION It is common to experience some constipation after surgery and if you are taking pain medication.  Increasing fluid intake and taking a stool softener (such as Colace) will usually help or prevent this problem from occurring.  A mild laxative (Milk of Magnesia or Miralax) should be taken according to package instructions if there are no bowel movements after 48  hours.  WOUND/INCISION CARE Most patients will experience some swelling and bruising in the area of the incisions.  Ice packs will help.  Swelling and bruising can take several days to resolve.  Unless discharge instructions indicate otherwise, follow guidelines below  STERI-STRIPS - you may remove your outer bandages 48 hours after surgery, and you may shower at that time.  You have steri-strips (small skin tapes) in place directly over the incision.  These strips should be left on the skin for 7-10 days.   DERMABOND/SKIN GLUE - you may shower in 24 hours.  The glue will flake off over the next 2-3 weeks. Any sutures or staples will be removed at the office during your follow-up visit.  ACTIVITIES You may resume regular (light) daily activities beginning the next day--such as daily self-care, walking, climbing stairs--gradually increasing activities as tolerated.  You may have sexual intercourse when it is comfortable.  Refrain from any heavy lifting or straining until approved by your doctor. You may drive when you are no longer taking prescription pain medication, you can comfortably wear a seatbelt, and you can safely maneuver your car and apply brakes.  FOLLOW-UP You should see your doctor in the office for a follow-up appointment approximately 2-3 weeks after your surgery.  You should have been given your post-op/follow-up appointment when your surgery was scheduled.  If you did not receive a post-op/follow-up appointment, make sure that you call for this appointment within a day or two after you arrive home to insure a convenient appointment time.   WHEN TO CALL YOUR DOCTOR: Fever over 101.0 Inability to urinate Continued bleeding from incision.   Increased pain, redness, or drainage from the incision. Increasing abdominal pain  The clinic staff is available to answer your questions during regular business hours.  Please don't hesitate to call and ask to speak to one of the nurses for  clinical concerns.  If you have a medical emergency, go to the nearest emergency room or call 911.  A surgeon from [facility] is always on call at the hospital. [address], [address], [place], [place]  [address] ? [address] [address], [place], [place]   [address] [phone] ? [phone] ? FAX [phone] Web site: [email or web address]  

## 2022-09-23 NOTE — Anesthesia Procedure Notes (Signed)
Procedure Name: Intubation Date/Time: 09/23/2022 11:20 AM  Performed by: Lance Coon, CRNAPre-anesthesia Checklist: Patient identified, Emergency Drugs available, Suction available, Patient being monitored and Timeout performed Patient Re-evaluated:Patient Re-evaluated prior to induction Oxygen Delivery Method: Circle system utilized Preoxygenation: Pre-oxygenation with 100% oxygen Induction Type: IV induction and Rapid sequence Laryngoscope Size: Miller and 3 Grade View: Grade I Tube type: Oral Tube size: 8.0 mm Number of attempts: 1 Airway Equipment and Method: Stylet Placement Confirmation: ETT inserted through vocal cords under direct vision, positive ETCO2 and breath sounds checked- equal and bilateral Secured at: 22 cm Tube secured with: Tape Dental Injury: Teeth and Oropharynx as per pre-operative assessment

## 2022-09-24 ENCOUNTER — Encounter (HOSPITAL_COMMUNITY): Payer: Self-pay | Admitting: Surgery

## 2022-09-24 DIAGNOSIS — K810 Acute cholecystitis: Secondary | ICD-10-CM | POA: Diagnosis not present

## 2022-09-24 LAB — CBC WITH DIFFERENTIAL/PLATELET
Abs Immature Granulocytes: 0.05 10*3/uL (ref 0.00–0.07)
Basophils Absolute: 0 10*3/uL (ref 0.0–0.1)
Basophils Relative: 0 %
Eosinophils Absolute: 0 10*3/uL (ref 0.0–0.5)
Eosinophils Relative: 0 %
HCT: 20.1 % — ABNORMAL LOW (ref 39.0–52.0)
Hemoglobin: 7.1 g/dL — ABNORMAL LOW (ref 13.0–17.0)
Immature Granulocytes: 1 %
Lymphocytes Relative: 5 %
Lymphs Abs: 0.4 10*3/uL — ABNORMAL LOW (ref 0.7–4.0)
MCH: 34 pg (ref 26.0–34.0)
MCHC: 35.3 g/dL (ref 30.0–36.0)
MCV: 96.2 fL (ref 80.0–100.0)
Monocytes Absolute: 0.3 10*3/uL (ref 0.1–1.0)
Monocytes Relative: 3 %
Neutro Abs: 7.7 10*3/uL (ref 1.7–7.7)
Neutrophils Relative %: 91 %
Platelets: 141 10*3/uL — ABNORMAL LOW (ref 150–400)
RBC: 2.09 MIL/uL — ABNORMAL LOW (ref 4.22–5.81)
RDW: 13.6 % (ref 11.5–15.5)
WBC: 8.5 10*3/uL (ref 4.0–10.5)
nRBC: 0 % (ref 0.0–0.2)

## 2022-09-24 LAB — CBC
HCT: 23.3 % — ABNORMAL LOW (ref 39.0–52.0)
Hemoglobin: 8.1 g/dL — ABNORMAL LOW (ref 13.0–17.0)
MCH: 32.9 pg (ref 26.0–34.0)
MCHC: 34.8 g/dL (ref 30.0–36.0)
MCV: 94.7 fL (ref 80.0–100.0)
Platelets: 168 10*3/uL (ref 150–400)
RBC: 2.46 MIL/uL — ABNORMAL LOW (ref 4.22–5.81)
RDW: 13.4 % (ref 11.5–15.5)
WBC: 11.3 10*3/uL — ABNORMAL HIGH (ref 4.0–10.5)
nRBC: 0 % (ref 0.0–0.2)

## 2022-09-24 LAB — COMPREHENSIVE METABOLIC PANEL
ALT: 44 U/L (ref 0–44)
AST: 65 U/L — ABNORMAL HIGH (ref 15–41)
Albumin: 2.2 g/dL — ABNORMAL LOW (ref 3.5–5.0)
Alkaline Phosphatase: 31 U/L — ABNORMAL LOW (ref 38–126)
Anion gap: 13 (ref 5–15)
BUN: 66 mg/dL — ABNORMAL HIGH (ref 8–23)
CO2: 18 mmol/L — ABNORMAL LOW (ref 22–32)
Calcium: 8 mg/dL — ABNORMAL LOW (ref 8.9–10.3)
Chloride: 99 mmol/L (ref 98–111)
Creatinine, Ser: 6.06 mg/dL — ABNORMAL HIGH (ref 0.61–1.24)
GFR, Estimated: 9 mL/min — ABNORMAL LOW (ref >60)
Glucose, Bld: 177 mg/dL — ABNORMAL HIGH (ref 70–99)
Potassium: 5.3 mmol/L — ABNORMAL HIGH (ref 3.5–5.1)
Sodium: 130 mmol/L — ABNORMAL LOW (ref 135–145)
Total Bilirubin: 0.9 mg/dL (ref 0.3–1.2)
Total Protein: 5.7 g/dL — ABNORMAL LOW (ref 6.5–8.1)

## 2022-09-24 LAB — TYPE AND SCREEN
ABO/RH(D): A POS
Antibody Screen: NEGATIVE

## 2022-09-24 LAB — BRAIN NATRIURETIC PEPTIDE: B Natriuretic Peptide: 463.9 pg/mL — ABNORMAL HIGH (ref 0.0–100.0)

## 2022-09-24 LAB — C-REACTIVE PROTEIN: CRP: 28.8 mg/dL — ABNORMAL HIGH (ref <1.0)

## 2022-09-24 LAB — PREPARE RBC (CROSSMATCH)

## 2022-09-24 LAB — OSMOLALITY: Osmolality: 285 mOsm/kg (ref 275–295)

## 2022-09-24 LAB — SURGICAL PATHOLOGY

## 2022-09-24 LAB — MAGNESIUM: Magnesium: 2 mg/dL (ref 1.7–2.4)

## 2022-09-24 LAB — PROCALCITONIN: Procalcitonin: 6.45 ng/mL

## 2022-09-24 MED ORDER — MUPIROCIN 2 % EX OINT
1.0000 | TOPICAL_OINTMENT | Freq: Two times a day (BID) | CUTANEOUS | Status: AC
  Administered 2022-09-24 – 2022-09-28 (×10): 1 via NASAL
  Filled 2022-09-24 (×5): qty 22

## 2022-09-24 MED ORDER — OXYCODONE HCL 5 MG PO TABS: 5.0000 mg | ORAL_TABLET | Freq: Four times a day (QID) | ORAL | Status: DC | PRN

## 2022-09-24 MED ORDER — SODIUM CHLORIDE 0.9% IV SOLUTION: Freq: Once | INTRAVENOUS | Status: AC

## 2022-09-24 MED ORDER — CHLORHEXIDINE GLUCONATE CLOTH 2 % EX PADS
6.0000 | MEDICATED_PAD | Freq: Every day | CUTANEOUS | Status: AC
  Administered 2022-09-25 – 2022-09-29 (×5): 6 via TOPICAL

## 2022-09-24 MED ORDER — HYDROMORPHONE HCL 1 MG/ML IJ SOLN: 0.5000 mg | INTRAMUSCULAR | Status: DC | PRN

## 2022-09-24 MED ORDER — SODIUM ZIRCONIUM CYCLOSILICATE 10 G PO PACK
10.0000 g | PACK | Freq: Once | ORAL | Status: AC
  Administered 2022-09-24: 10 g via ORAL
  Filled 2022-09-24: qty 1

## 2022-09-24 MED ORDER — IPRATROPIUM-ALBUTEROL 20-100 MCG/ACT IN AERS
1.0000 | INHALATION_SPRAY | Freq: Four times a day (QID) | RESPIRATORY_TRACT | Status: DC
  Administered 2022-09-24 – 2022-09-28 (×18): 1 via RESPIRATORY_TRACT
  Filled 2022-09-24: qty 4

## 2022-09-24 MED ORDER — METOPROLOL TARTRATE 50 MG PO TABS
50.0000 mg | ORAL_TABLET | Freq: Two times a day (BID) | ORAL | Status: DC
  Administered 2022-09-24 – 2022-09-29 (×10): 50 mg via ORAL
  Filled 2022-09-24 (×10): qty 1

## 2022-09-24 NOTE — Progress Notes (Signed)
PROGRESS NOTE                                                                                                                                                                                                             Patient Demographics:    Erik Perez, is a 76 y.o. male, DOB - 08-Nov-1945, IOX:[id number]  Outpatient Primary MD for the patient is Lurline Del, DO    LOS - 3  Admit date - 09/21/2022    Chief Complaint  Patient presents with   Abdominal Pain       Brief Narrative (HPI from H&P)    76 y.o. male with medical history significant of CAD, AAA, CKD, afib on Eliquis (last dose night), carotid stenosis, HTN, HLD, and OSA on CPAP presenting with abdominal pain. Patient reports that he has had mild SOB and fatigue since diagnosed with afib in October.  He became acutely ill with cough, congestion and was diagnosed with COVID on 12/20, started treatment with molnupiravir that day (completing 5 days today).  He also had a known AAA.  He started about 0600 with acute R-sided abdominal pain that radiated to his R shoulder.  The pain later spread across his mid abdomen to both flanks, him to the ER where workup was suggestive of acute cholecystitis, he was seen by general surgery and admitted by [facility].   Subjective:   No acute complaint.  No nausea no vomiting.  No fever no chills.  Abdominal pain still present.  Passing gas occasionally.  No BM so far.   Assessment  & Plan :    Acute cholecystitis  Underwent lap chole on 4/26. Currently diet being advanced. Monitor. On IV antibiotics.  Cultures growing Klebsiella.   Unintentional narcotic overdose night of 09/22/2022.  Resolved after Narcan.  As needed Narcan on board.  Cautious use of narcotics.    COVID 19 pneumonia present on admission.   Completed molnupiravir. Continue on 10-day isolation. isolation which is ending 09/27/2022.   Paroxysmal A-fib with  Mali vas 2 score of greater than 4.  Eliquis on hold for surgery last dose 09/20/2022, continue beta-blocker and amiodarone, diltiazem as needed.  Resume per surgery.  Acute on chronic diastolic CHF EF 26% on recent echocardiogram.  Lasix, continue beta-blocker, be careful with IV fluids.  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  , Nephrology consult appreciated. Will need HD.  HTN  - currently on beta-blocker and stable as needed IV hydralazine.  Monitor.  Discontinue Norvasc upon discharge as he is on Cardizem as well.   HLD  -Continue atorvastatin   OSA  -Continue CPAP nightly   Gout  -Continue allopurinol  Infrarenal abdominal aortic aneurysm measures 3.7 cm - incidental, defer to PCP for monitoring closely.  Beta-blocker if he can tolerate.  CTA with an indeterminate exophytic lesion along the inferior pole of L kidney - needs further evaluation as an outpatient with abdominal MRI, defer to PCP.       Condition - Extremely Guarded  Family Communication  :  wife bedside 09/22/22  Code Status :  Full  Consults  :  CCS, nephrology  PUD Prophylaxis : PPI   Procedures  :     Renal ultrasound ordered 09/23/2022   CT - 1. No evidence for thoracic or abdominal aortic dissection. 2. Gallstones with diffuse gallbladder wall thickening and pericholecystic inflammation. Findings are concerning for acute cholecystitis. There is small volume of free fluid which appears mildly complex measuring 19 Hounsfield units extending from the gallbladder along the right pericolic gutter and along bilateral peritoneal reflections within the lower abdomen. 3. Wall thickening involving the descending portion of the duodenum adjacent to the gallbladder is likely reactive in the setting of acute  cholecystitis. 4. Infrarenal abdominal aortic aneurysm measures 3.7 cm. Recommend follow-up ultrasound every 2 years. This recommendation follows ACR consensus guidelines: White Paper of the ACR Incidental Findings Committee II on Vascular Findings. J Am Coll Radiol 2013; 10:789-794. 5. Trace pleural fluid bilaterally. 6. Indeterminate exophytic lesion arising off the inferior pole of the left kidney. This may represent a hemorrhagic cyst or enhancing kidney lesion. This does not require emergent attention. When the patient is clinically stable and able to follow directions and hold their breath (preferably as an outpatient) further evaluation with dedicated abdominal MRI should be considered. 7.  Aortic Atherosclerosis      Disposition Plan  :    Status is: Inpatient  DVT Prophylaxis  :    SCDs Start: 09/21/22 0954   Lab Results  Component Value Date   PLT 168 09/24/2022    Diet :  Diet Order             Diet NPO time specified  Diet effective midnight           Diet renal/carb modified with fluid restriction Diet-HS Snack? Nothing; Fluid restriction: 1200 mL Fluid; Room service appropriate? Yes; Fluid consistency: Thin  Diet effective now                    Inpatient Medications  Scheduled Meds:  acetaminophen  650 mg Oral Q6H   allopurinol  100 mg Oral BID   amiodarone  200 mg Oral Q0600   atorvastatin  40 mg Oral Daily   [START ON 09/25/2022] Chlorhexidine Gluconate Cloth  6 each Topical Q0600   doxazosin  4 mg Oral Daily   Ipratropium-Albuterol  1 puff Inhalation QID   metoprolol tartrate  50 mg Oral BID   mupirocin ointment  1 Application Nasal BID   pantoprazole  40 mg Oral Daily  Continuous Infusions:  piperacillin-tazobactam (ZOSYN)  IV 2.25 g (09/24/22 1817)   PRN Meds:.acetaminophen **OR** acetaminophen, albuterol, camphor-menthol **AND** hydrOXYzine, hydrALAZINE, HYDROmorphone (DILAUDID) injection, naLOXone (NARCAN)  injection, nitroGLYCERIN, ondansetron  **OR** ondansetron (ZOFRAN) IV, oxyCODONE, prochlorperazine, simethicone     Objective:   Vitals:   09/24/22 1143 09/24/22 1411 09/24/22 1603 09/24/22 1644  BP: (!) 99/48 103/63 95/61 109/63  Pulse: 76 79 (!) 103   Resp: '16 18 18   '$ Temp: 97.9 F (36.6 C) (!) 97.5 F (36.4 C)  97.8 F (36.6 C)  TempSrc: Oral Oral  Oral  SpO2: 93% 92% 93% 93%    Wt Readings from Last 3 Encounters:  09/12/22 104.3 kg  09/05/22 103.4 kg  09/04/22 107 kg     Intake/Output Summary (Last 24 hours) at 09/24/2022 1831 Last data filed at 09/24/2022 1600 Gross per 24 hour  Intake 854.56 ml  Output 1250 ml  Net -395.44 ml      Physical Exam  Clear to auscultation. S1-S2 present Bowel sounds sluggish. Distended. Bilateral edema trace.       Data Review:    Recent Labs  Lab 09/21/22 0810 09/22/22 0552 09/23/22 0338 09/24/22 0329 09/24/22 1548  WBC 4.5 6.7 8.6 8.5 11.3*  HGB 9.4* 8.9* 8.7* 7.1* 8.1*  HCT 28.5* 25.5* 25.0* 20.1* 23.3*  PLT 157 146* 150 141* 168  MCV 100.0 98.1 97.3 96.2 94.7  MCH 33.0 34.2* 33.9 34.0 32.9  MCHC 33.0 34.9 34.8 35.3 34.8  RDW 13.6 13.8 13.8 13.6 13.4  LYMPHSABS  --   --  0.7 0.4*  --   MONOABS  --   --  0.7 0.3  --   EOSABS  --   --  0.0 0.0  --   BASOSABS  --   --  0.0 0.0  --      Recent Labs  Lab 09/21/22 0725 09/21/22 0810 09/22/22 0552 09/22/22 0918 09/23/22 0338 09/24/22 0329  NA 135 132* 132*  --  130* 130*  K 4.2 4.1 4.6  --  4.6 5.3*  CL 102 99 98  --  97* 99  CO2  --  21* 21*  --  19* 18*  ANIONGAP  --  12 13  --  14 13  GLUCOSE 143* 145* 98  --  90 177*  BUN 28* 25* 38*  --  49* 66*  CREATININE 2.60* 2.66* 3.79*  --  5.00* 6.06*  AST  --  17 16  --  19 65*  ALT  --  16 16  --  18 44  ALKPHOS  --  36* 35*  --  40 31*  BILITOT  --  1.2 1.1  --  1.0 0.9  ALBUMIN  --  2.6* 2.3*  --  2.3* 2.2*  CRP  --   --   --   --  36.8* 28.8*  PROCALCITON  --   --  7.10  --  8.25 6.45  INR  --  1.9*  --  1.7*  --   --   BNP  --    --  293.1*  --  370.4* 463.9*  MG  --   --  1.7  --  1.8 2.0  CALCIUM  --  8.2* 8.0*  --  7.9* 8.0*         Micro Results Recent Results (from the past 240 hour(s))  MRSA Next Gen by PCR, Nasal     Status: Abnormal   Collection Time: 09/22/22  6:00 AM   Specimen: Nasal Mucosa; Nasal Swab  Result Value Ref Range Status   MRSA by PCR Next Gen DETECTED (A) NOT DETECTED Final    Comment: [facility] NOTIFIED ADELIDE BUTLER ON 12.25.23 AT 0944 BY EM (NOTE) The GeneXpert MRSA Assay (FDA approved for NASAL specimens only), is one component of a comprehensive MRSA colonization surveillance program. It is not intended to diagnose MRSA infection nor to guide or monitor treatment for MRSA infections. Test performance is not FDA approved in patients less than 32 years old. Performed at [facility], [place] [address] [place], [place] [address]   Aerobic/Anaerobic Culture w Gram Stain (surgical/deep wound)     Status: None (Preliminary result)   Collection Time: 09/23/22 11:50 AM   Specimen: PATH Cytology Peritoneal fluid; Body Fluid  Result Value Ref Range Status   Specimen Description OTHER  Final   Special Requests  ASCITES  Final   Gram Stain   Final    FEW WBC PRESENT, PREDOMINANTLY PMN NO ORGANISMS SEEN    Culture   Final    RARE KLEBSIELLA PNEUMONIAE CULTURE REINCUBATED FOR BETTER GROWTH SUSCEPTIBILITIES TO FOLLOW Performed at [facility], [address] [place], [place] [address]    Report Status PENDING  Incomplete    Radiology Reports US RENAL  Result Date: 09/23/2022 CLINICAL DATA:  Acute kidney injury EXAM: RENAL / URINARY TRACT ULTRASOUND COMPLETE COMPARISON:  09/21/2022 FINDINGS: Right Kidney: Renal measurements: 10.4 x 5.8 x 6.5 cm = volume: 202.6. mL. Echogenicity within normal limits. Mild renal cortical thinning. No solid mass or hydronephrosis visualized. Right interpolar renal cyst measuring 3.5 cm. 5.6 cm right inferior pole renal cyst. Left Kidney: Renal  measurements: 10.6 x 5 x 5.3 cm = volume: 146.5 mL. Echogenicity within normal limits. Mild renal cortical thinning. No solid mass or hydronephrosis visualized. 2 cm left inferior pole renal cyst. Bladder: Appears normal for degree of bladder distention. Other: None. IMPRESSION: 1. No obstructive uropathy. 2. Mild bilateral renal cortical thinning as can be seen with medical renal disease. 3. Bilateral renal cysts. Electronically Signed   By: Kathreen Devoid M.D.   On: 09/23/2022 11:35   DG Chest Port 1 View  Result Date: 09/22/2022 CLINICAL DATA:  Shortness of breath EXAM: PORTABLE CHEST 1 VIEW COMPARISON:  Radiograph 09/11/2022, CT 09/21/2022 FINDINGS: Unchanged cardiomediastinal silhouette with median sternotomy and postsurgical changes of CABG. There are right medial basilar airspace opacities. No evidence of pneumothorax or large effusion. No acute osseous abnormality. Cervical spine fusion hardware noted. IMPRESSION: Right medial basilar airspace opacities, could be atelectasis or infection. Recommend continued follow-up. Electronically Signed   By: Maurine Simmering M.D.   On: 09/22/2022 09:45   CT Angio Chest/Abd/Pel for Dissection W and/or Wo Contrast  Result Date: 09/21/2022 CLINICAL DATA:  Rule out acute aortic syndrome. Severe abdominal pain EXAM: CT ANGIOGRAPHY CHEST, ABDOMEN AND PELVIS TECHNIQUE: Non-contrast CT of the chest was initially obtained. Multidetector CT imaging through the chest, abdomen and pelvis was performed using the standard protocol during bolus administration of intravenous contrast. Multiplanar reconstructed images and MIPs were obtained and reviewed to evaluate the vascular anatomy. RADIATION DOSE REDUCTION: This exam was performed according to the departmental dose-optimization program which includes automated exposure control, adjustment of the mA and/or kV according to patient size and/or use of iterative reconstruction technique. CONTRAST:  35m OMNIPAQUE IOHEXOL 350 MG/ML  SOLN COMPARISON:  Abdominal aortic ultrasound 07/28/2018 FINDINGS: CTA CHEST FINDINGS Cardiovascular: Preferential opacification of the thoracic aorta.  No evidence of thoracic aortic aneurysm or dissection. Normal heart size. No pericardial effusion. Aortic atherosclerosis. Previous CABG procedure. Mediastinum/Nodes: Thyroid gland, trachea appear normal. There is mild diffuse circumferential wall thickening of the esophagus which may reflect underlying esophagitis. No enlarged axillary, supraclavicular, mediastinal, or hilar lymph nodes. Lungs/Pleura: Trace pleural fluid identified bilaterally. Mild atelectasis noted in the posterior right base. No suspicious pulmonary nodule or mass. Musculoskeletal: No acute abnormality.  Previous median sternotomy. Review of the MIP images confirms the above findings. CTA ABDOMEN AND PELVIS FINDINGS VASCULAR Aorta: Infrarenal abdominal aortic aneurysm measures 3.7 cm, image 199/6. Aortic atherosclerotic calcifications. No signs of dissection. No significant stenosis. Celiac: Patent. 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 Renals: Both renal arteries are patent without evidence of aneurysm, dissection, vasculitis, fibromuscular dysplasia or significant stenosis. IMA: Patent without evidence of aneurysm, dissection, vasculitis or significant stenosis. Inflow: Patent without evidence of aneurysm, dissection, vasculitis or significant stenosis. Veins: No obvious venous abnormality within the limitations of this arterial phase study. Review of the MIP images confirms the above findings. NON-VASCULAR Hepatobiliary: Liver cysts noted.  No suspicious liver lesion. There are multiple stones within the gallbladder which measure up to 1.5 cm. There is diffuse gallbladder wall thickening and pericholecystic  inflammation. Pericholecystic fluid is identified and there is fluid extending along the right pericolic gutter. Fluid measures scratch set the fluid is mildly complex measuring 19 Hounsfield units, image 275/6. No signs of bile duct dilatation. Pancreas: Unremarkable. No pancreatic ductal dilatation or surrounding inflammatory changes. Spleen: Normal in size without focal abnormality. Adrenals/Urinary Tract: There is a nodule in the right adrenal gland measuring 2 cm and 1.5 Hounsfield units. This is compatible with a benign adenoma. No follow-up imaging recommended. Bilateral kidney cysts are identified. Arising off the inferior pole of the left kidney is an exophytic lesion measuring 1.7 cm and 85 Hounsfield units. This does not meet CT criteria for benign kidney lesion. No signs of nephrolithiasis or hydronephrosis. Urinary bladder is unremarkable. Stomach/Bowel: Stomach is normal. There is wall thickening involving the descending portion of the duodenum adjacent to the gallbladder. The appendix is visualized and appears normal. No pathologic dilatation of the large or small bowel loops. Lymphatic: No enlarged abdominopelvic adenopathy. Reproductive: Moderate prostate gland enlargement. Other: No discrete fluid collection identified. No signs of pneumoperitoneum. Musculoskeletal: No acute or significant osseous findings. Review of the MIP images confirms the above findings. IMPRESSION: 1. No evidence for thoracic or abdominal aortic dissection. 2. Gallstones with diffuse gallbladder wall thickening and pericholecystic inflammation. Findings are concerning for acute cholecystitis. There is small volume of free fluid which appears mildly complex measuring 19 Hounsfield units extending from the gallbladder along the right pericolic gutter and along bilateral peritoneal reflections within the lower abdomen. 3. Wall thickening involving the descending portion of the duodenum adjacent to the gallbladder is likely  reactive in the setting of acute cholecystitis. 4. Infrarenal abdominal aortic aneurysm measures 3.7 cm. Recommend follow-up ultrasound every 2 years. This recommendation follows ACR consensus guidelines: White Paper of the ACR Incidental Findings Committee II on Vascular Findings. J Am Coll Radiol 2013; 10:789-794. 5. Trace pleural fluid bilaterally. 6. Indeterminate exophytic lesion arising off the inferior pole of the left kidney. This may represent a hemorrhagic cyst or enhancing kidney lesion. This does not require emergent attention. When the patient is clinically stable and able to  follow directions and hold their breath (preferably as an outpatient) further evaluation with dedicated abdominal MRI should be considered. 7.  Aortic Atherosclerosis (ICD10-I70.0). Electronically Signed   By: Kerby Moors M.D.   On: 09/21/2022 08:15      Signature  -   Berle Mull M.D on 09/24/2022 at 6:31 PM   -  To page go to [email or web address]

## 2022-09-24 NOTE — Progress Notes (Signed)
Progress Note  1 Day Post-Op  Subjective: Pt overall doing well. Pain seems fairly well controlled. Drain output bloody. Denies n/v and tolerating CLD. Pt's son at bedside and other son on the phone.   Objective: Vital signs in last 24 hours: Temp:  [97.7 F (36.5 C)-99.1 F (37.3 C)] 98.2 F (36.8 C) (12/27 0800) Pulse Rate:  [94-119] 94 (12/27 0800) Resp:  [9-16] 16 (12/27 0800) BP: (103-125)/(55-75) 111/55 (12/27 0800) SpO2:  [93 %-99 %] 93 % (12/27 0800) Last BM Date : 09/18/22  Intake/Output from previous day: 12/26 0701 - 12/27 0700 In: 3012.8 [I.V.:2262.8; IV Piggyback:750] Out: 755 [Urine:250; Drains:305; Blood:200] Intake/Output this shift: Total I/O In: -  Out: 525 [Urine:500; Drains:25]  PE: General: pleasant, WD, obese male who is laying in bed in NAD HEENT: sclera anicteric  Lungs: Respiratory effort nonlabored Abd: soft, NT, mild distention, drain with bloody fluid, incisions C/D/I Psych: A&Ox3 with an appropriate affect.    Lab Results:  Recent Labs    09/23/22 0338 09/24/22 0329  WBC 8.6 8.5  HGB 8.7* 7.1*  HCT 25.0* 20.1*  PLT 150 141*   BMET Recent Labs    09/23/22 0338 09/24/22 0329  NA 130* 130*  K 4.6 5.3*  CL 97* 99  CO2 19* 18*  GLUCOSE 90 177*  BUN 49* 66*  CREATININE 5.00* 6.06*  CALCIUM 7.9* 8.0*   PT/INR Recent Labs    09/22/22 0918  LABPROT 19.4*  INR 1.7*   CMP     Component Value Date/Time   NA 130 (L) 09/24/2022 0329   NA 143 09/04/2022 1043   K 5.3 (H) 09/24/2022 0329   CL 99 09/24/2022 0329   CO2 18 (L) 09/24/2022 0329   GLUCOSE 177 (H) 09/24/2022 0329   BUN 66 (H) 09/24/2022 0329   BUN 37 (H) 09/04/2022 1043   CREATININE 6.06 (H) 09/24/2022 0329   CALCIUM 8.0 (L) 09/24/2022 0329   PROT 5.7 (L) 09/24/2022 0329   ALBUMIN 2.2 (L) 09/24/2022 0329   AST 65 (H) 09/24/2022 0329   ALT 44 09/24/2022 0329   ALKPHOS 31 (L) 09/24/2022 0329   BILITOT 0.9 09/24/2022 0329   GFRNONAA 9 (L) 09/24/2022 0329    GFRAA  06/07/2008 0500    >60        The eGFR has been calculated using the MDRD equation. This calculation has not been validated in all clinical   Lipase     Component Value Date/Time   LIPASE 50 09/21/2022 0810       Studies/Results: US RENAL  Result Date: 09/23/2022 CLINICAL DATA:  Acute kidney injury EXAM: RENAL / URINARY TRACT ULTRASOUND COMPLETE COMPARISON:  09/21/2022 FINDINGS: Right Kidney: Renal measurements: 10.4 x 5.8 x 6.5 cm = volume: 202.6. mL. Echogenicity within normal limits. Mild renal cortical thinning. No solid mass or hydronephrosis visualized. Right interpolar renal cyst measuring 3.5 cm. 5.6 cm right inferior pole renal cyst. Left Kidney: Renal measurements: 10.6 x 5 x 5.3 cm = volume: 146.5 mL. Echogenicity within normal limits. Mild renal cortical thinning. No solid mass or hydronephrosis visualized. 2 cm left inferior pole renal cyst. Bladder: Appears normal for degree of bladder distention. Other: None. IMPRESSION: 1. No obstructive uropathy. 2. Mild bilateral renal cortical thinning as can be seen with medical renal disease. 3. Bilateral renal cysts. Electronically Signed   By: Kathreen Devoid M.D.   On: 09/23/2022 11:35   DG Chest Port 1 View  Result Date: 09/22/2022 CLINICAL DATA:  Shortness  of breath EXAM: PORTABLE CHEST 1 VIEW COMPARISON:  Radiograph 09/11/2022, CT 09/21/2022 FINDINGS: Unchanged cardiomediastinal silhouette with median sternotomy and postsurgical changes of CABG. There are right medial basilar airspace opacities. No evidence of pneumothorax or large effusion. No acute osseous abnormality. Cervical spine fusion hardware noted. IMPRESSION: Right medial basilar airspace opacities, could be atelectasis or infection. Recommend continued follow-up. Electronically Signed   By: Maurine Simmering M.D.   On: 09/22/2022 09:45    Anti-infectives: Anti-infectives (From admission, onward)    Start     Dose/Rate Route Frequency Ordered Stop   09/23/22 1530   piperacillin-tazobactam (ZOSYN) IVPB 2.25 g        2.25 g 100 mL/hr over 30 Minutes Intravenous Every 6 hours 09/23/22 1443     09/21/22 1400  piperacillin-tazobactam (ZOSYN) IVPB 3.375 g  Status:  Discontinued        3.375 g 12.5 mL/hr over 240 Minutes Intravenous Every 8 hours 09/21/22 1031 09/23/22 1443   09/21/22 1015  molnupiravir EUA (LAGEVRIO) capsule 800 mg  Status:  Discontinued        4 capsule Oral 2 times daily 09/21/22 1000 09/24/22 0759   09/21/22 0830  piperacillin-tazobactam (ZOSYN) IVPB 3.375 g        3.375 g 100 mL/hr over 30 Minutes Intravenous  Once 09/21/22 8366 09/21/22 0925        Assessment/Plan Acute cholecystitis  POD1 S/P laparoscopic cholecystectomy  - drain bloody this AM and hgb 7.1 - monitor drain output and transfuse 1 unit PRBC - tolerating CLD - ok to advance to renal/CM diet - mobilize as tolerated - repeat LFTs in AM - continue abx  - pt will discharge home with drain - will set up follow up for removal in 10-14 days   FEN: CM/renal diet with fluid restriction, IVF per TRH/renal  VTE: SCDs, no chemical prophylaxis in setting of ABL anemia  ID: Zosyn 12/24>>  - below per TRH -  CAD AAA CKD A. Fib on eliquis Carotid stenosis HTN HLD OSA on CPAP COVID+  LOS: 3 days     Norm Parcel, [facility] 09/24/2022, 8:42 AM Please see Amion for pager number during day hours 7:00am-4:30pm

## 2022-09-24 NOTE — Progress Notes (Signed)
Mobility Specialist Progress Note:   09/24/22 1043  Mobility  Activity Ambulated with assistance in room  Level of Assistance Contact guard assist, steadying assist  Assistive Device Front wheel walker  Distance Ambulated (ft) 100 ft  Activity Response Tolerated well  Mobility Referral Yes  $Mobility charge 1 Mobility   Pre-mobility: 104 HR, 94% SpO2 Post-mobility: 121 HR, 92% SpO2  Pt received in bed and agreeable. No complaints of pain or SOB. Pt left in bed with all needs met, call bell in reach, and family in room.   Andrey Campanile Mobility Specialist Please contact via SecureChat or  Rehab office at [phone]

## 2022-09-24 NOTE — Progress Notes (Addendum)
[facility] Progress Note   Subjective:   Feeling ok this AM.  No voiding difficulty.  Was tolerating clears well last PM.  UOP 258m yesterday.   Some hiccups, no dysgeusia or nausea.    Objective Vitals:   09/23/22 1615 09/23/22 2116 09/23/22 2312 09/24/22 0500  BP:  105/75 125/68 122/71  Pulse:  (!) 119 (!) 101 98  Resp: '14 16 16 14  '$ Temp:  98.5 F (36.9 C) 98.5 F (36.9 C) 97.7 F (36.5 C)  TempSrc:  Oral Oral Oral  SpO2:  97% 94% 94%   Physical Exam Gen: obese man who is comfortable in bed Eyes: aniceteric ENT: MM dry CV: reg rate, irreg, no rub  Abd:  mild TTP, soft s/p lap chole Lungs: clear today, no coughing GU: no foley Extr: 1+ generalized edema Neuro: AOx3, nonfocal, no asterixis  Additional Objective Labs: Basic Metabolic Panel: Recent Labs  Lab 09/22/22 0552 09/23/22 0338 09/24/22 0329  NA 132* 130* 130*  K 4.6 4.6 5.3*  CL 98 97* 99  CO2 21* 19* 18*  GLUCOSE 98 90 177*  BUN 38* 49* 66*  CREATININE 3.79* 5.00* 6.06*  CALCIUM 8.0* 7.9* 8.0*   Liver Function Tests: Recent Labs  Lab 09/22/22 0552 09/23/22 0338 09/24/22 0329  AST 16 19 65*  ALT 16 18 44  ALKPHOS 35* 40 31*  BILITOT 1.1 1.0 0.9  PROT 5.6* 5.9* 5.7*  ALBUMIN 2.3* 2.3* 2.2*   Recent Labs  Lab 09/21/22 0810  LIPASE 50   CBC: Recent Labs  Lab 09/21/22 0810 09/22/22 0552 09/23/22 0338 09/24/22 0329  WBC 4.5 6.7 8.6 8.5  NEUTROABS  --   --  7.3 7.7  HGB 9.4* 8.9* 8.7* 7.1*  HCT 28.5* 25.5* 25.0* 20.1*  MCV 100.0 98.1 97.3 96.2  PLT 157 146* 150 141*   Blood Culture    Component Value Date/Time   SDES OTHER 09/23/2022 1150   SPECREQUEST  ASCITES 09/23/2022 1150   CULT PENDING 09/23/2022 1150   REPTSTATUS PENDING 09/23/2022 1150    Cardiac Enzymes: No results for input(s): "CKTOTAL", "CKMB", "CKMBINDEX", "TROPONINI" in the last 168 hours. CBG: Recent Labs  Lab 09/22/22 2215  GLUCAP 115*   Iron Studies: No results for input(s): "IRON",  "TIBC", "TRANSFERRIN", "FERRITIN" in the last 72 hours. '@lablastinr3'$ @ Studies/Results: [place]  Result Date: 09/23/2022 CLINICAL DATA:  Acute kidney injury EXAM: RENAL / URINARY TRACT ULTRASOUND COMPLETE COMPARISON:  09/21/2022 FINDINGS: Right Kidney: Renal measurements: 10.4 x 5.8 x 6.5 cm = volume: 202.6. mL. Echogenicity within normal limits. Mild renal cortical thinning. No solid mass or hydronephrosis visualized. Right interpolar renal cyst measuring 3.5 cm. 5.6 cm right inferior pole renal cyst. Left Kidney: Renal measurements: 10.6 x 5 x 5.3 cm = volume: 146.5 mL. Echogenicity within normal limits. Mild renal cortical thinning. No solid mass or hydronephrosis visualized. 2 cm left inferior pole renal cyst. Bladder: Appears normal for degree of bladder distention. Other: None. IMPRESSION: 1. No obstructive uropathy. 2. Mild bilateral renal cortical thinning as can be seen with medical renal disease. 3. Bilateral renal cysts. Electronically Signed   By: HKathreen DevoidM.D.   On: 09/23/2022 11:35   DG Chest Port 1 View  Result Date: 09/22/2022 CLINICAL DATA:  Shortness of breath EXAM: PORTABLE CHEST 1 VIEW COMPARISON:  Radiograph 09/11/2022, CT 09/21/2022 FINDINGS: Unchanged cardiomediastinal silhouette with median sternotomy and postsurgical changes of CABG. There are right medial basilar airspace opacities. No evidence of pneumothorax or large effusion.  No acute osseous abnormality. Cervical spine fusion hardware noted. IMPRESSION: Right medial basilar airspace opacities, could be atelectasis or infection. Recommend continued follow-up. Electronically Signed   By: Maurine Simmering M.D.   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 Cira Servant.  Baseline in the mid to high 2s lately now with acute worsening to 6.  Renal US ok.  Rec'd IV contrast - suspect this is contrast nephropathy +/- ATN in setting of acute cholecystitis.  Currently oliguric but clear indications for RRT.  Certainly at risk in the next 24-48h, discussed with family/pt.  Will keep him NPO pMN in case needs catheter placed tomorrow.   Hold further hydration at this time given development of mild edema but I don't see a use for diuretics at this point. Continue bladder scans. Follow I/Os, daily labs, avoid nephrotoxins.   **hyperkalemia, mild: lokelma dosed this AM.    **acute cholecystitis:  s/p lap chole today, on pip/tazo   **COVID infection: mild course s/p antiviral; isolation ends 12/30.   **A fib:  on outpt amio, BB.  Eliquis on hold s/p surgery.    **HTN: BPs acceptable currently   **OSA on CPAP.   Jannifer Hick MD 09/24/2022, 7:28 AM  [facility] Pager: [phone]

## 2022-09-24 NOTE — Progress Notes (Signed)
Patient unit of blood is complete and vitals are stable at this time. No reactions

## 2022-09-24 NOTE — TOC Initial Note (Signed)
Transition of Care [facility]) - Initial/Assessment Note    Patient Details  Name: Erik Perez MRN: [id number] Date of Birth: 05-03-1946  Transition of [facility]) CM/SW Contact:    Curlene Labrum, RN Phone Number: 09/24/2022, 4:17 PM  Clinical Narrative:                 CM met with the patient, spouse and both sons at the bedside to discuss transitions of care to home - pending medical stability.  The patient is S/P gallbladder removal and intact JP drain at this time. I provided the patient with choice regarding home health and the patient did not have a preference.  I called and spoke with Tommi Rumps, CM with [facility] and he accepted the patient for [facility] services including RN, PT, OT.  The patient lives at home and plans to return home with his wife.  The patient received PRBC infusion today and patient is being followed for labs due to kidney function.  Patient currently seen by Dr. Posey Pronto, [facility].  The patient will return home by car when medically stable for discharge - no date at this time.  Expected Discharge Plan: [address] Barriers to Discharge: Continued Medical Work up   Patient Goals and CMS Choice Patient states their goals for this hospitalization and ongoing recovery are:: to get better and go home CMS Medicare.gov Compare Post Acute Care list provided to:: Patient Choice offered to / list presented to : Patient      Expected Discharge Plan and Services   Discharge Planning Services: CM Consult Post Acute Care Choice: [place] arrangements for the past 2 months: [place] Arranged: RN, OT, PT [place] Agency: [place] Date [place]: 09/24/22 Time HH Agency Contacted: 70 Representative spoke with at [place]: Meredeth Ide [facility]  Prior Living Arrangements/Services Living arrangements for the past 2 months: [place] with:: Spouse Patient language  and need for interpreter reviewed:: Yes Do you feel safe going back to the place where you live?: Yes      Need for Family Participation in Patient Care: Yes (Comment) Care giver support system in place?: Yes (comment) Current home services: DME Criminal Activity/Legal Involvement Pertinent to Current Situation/Hospitalization: No - Comment as needed  Activities of Daily Living Home Assistive Devices/Equipment: CPAP, Eyeglasses ADL Screening (condition at time of admission) Patient's cognitive ability adequate to safely complete daily activities?: Yes Is the patient deaf or have difficulty hearing?: Yes Does the patient have difficulty seeing, even when wearing glasses/contacts?: No Does the patient have difficulty concentrating, remembering, or making decisions?: No Patient able to express need for assistance with ADLs?: Yes Does the patient have difficulty dressing or bathing?: No Independently performs ADLs?: Yes (appropriate for developmental age) Does the patient have difficulty walking or climbing stairs?: No Weakness of Legs: None Weakness of Arms/Hands: None  Permission Sought/Granted Permission sought to share information with : Case Manager, Family Supports, Chartered certified accountant granted to share information with : Yes, Verbal Permission Granted     Permission granted to share info w AGENCY: home health agency  Permission granted to share info w Relationship: spouse     Emotional Assessment Appearance:: Appears stated age Attitude/Demeanor/Rapport: Gracious Affect (typically observed): Accepting Orientation: : Oriented to Self, Oriented to Place,  Oriented to  Time, Oriented to Situation Alcohol / Substance Use: Not Applicable Psych Involvement: No (comment)  Admission diagnosis:  Acute cholecystitis [K81.0] Chronic anticoagulation [Z79.01] Chronic kidney disease, unspecified CKD stage [N18.9] COVID [U07.1] Patient Active Problem List    Diagnosis Date Noted   Acute cholecystitis 09/21/2022   COVID-19 virus infection 09/21/2022   CKD (chronic kidney disease) stage 4, GFR 15-29 ml/min ([place]) 09/21/2022   AAA (abdominal aortic aneurysm) without rupture ([place]) 09/21/2022   OSA on CPAP 09/21/2022   Coronary artery disease of bypass graft of native heart with stable angina pectoris ([place]) 08/20/2022   Paroxysmal atrial fibrillation ([place]) 07/17/2022   Secondary hypercoagulable state ([place]) 07/17/2022   Coronary atherosclerosis of native coronary artery 01/06/2014   Essential hypertension, benign 01/06/2014   Mixed hyperlipidemia 01/06/2014   Carotid disease, bilateral ([place]) 01/06/2014   PCP:  Lurline Del, DO Pharmacy:   [id number] [place] [id number]: [phone]: [address]Phone: [phone]: [phone]    Social Determinants of Health (SDOH) Social History: SDOH Screenings   Food Insecurity: No Food Insecurity (09/21/2022)  Housing: Low Risk  (09/21/2022)  Transportation Needs: No Transportation Needs (09/21/2022)  Utilities: Not At Risk (09/21/2022)  Tobacco Use: Medium Risk (09/24/2022)   SDOH Interventions:     Readmission Risk Interventions     No data to display

## 2022-09-25 DIAGNOSIS — K810 Acute cholecystitis: Secondary | ICD-10-CM | POA: Diagnosis not present

## 2022-09-25 LAB — COMPREHENSIVE METABOLIC PANEL
ALT: 48 U/L — ABNORMAL HIGH (ref 0–44)
AST: 55 U/L — ABNORMAL HIGH (ref 15–41)
Albumin: 2.1 g/dL — ABNORMAL LOW (ref 3.5–5.0)
Alkaline Phosphatase: 36 U/L — ABNORMAL LOW (ref 38–126)
Anion gap: 13 (ref 5–15)
BUN: 75 mg/dL — ABNORMAL HIGH (ref 8–23)
CO2: 19 mmol/L — ABNORMAL LOW (ref 22–32)
Calcium: 7.7 mg/dL — ABNORMAL LOW (ref 8.9–10.3)
Chloride: 98 mmol/L (ref 98–111)
Creatinine, Ser: 6.22 mg/dL — ABNORMAL HIGH (ref 0.61–1.24)
GFR, Estimated: 9 mL/min — ABNORMAL LOW (ref >60)
Glucose, Bld: 148 mg/dL — ABNORMAL HIGH (ref 70–99)
Potassium: 4.1 mmol/L (ref 3.5–5.1)
Sodium: 130 mmol/L — ABNORMAL LOW (ref 135–145)
Total Bilirubin: 0.8 mg/dL (ref 0.3–1.2)
Total Protein: 5.8 g/dL — ABNORMAL LOW (ref 6.5–8.1)

## 2022-09-25 LAB — CBC WITH DIFFERENTIAL/PLATELET
Abs Immature Granulocytes: 0.08 10*3/uL — ABNORMAL HIGH (ref 0.00–0.07)
Basophils Absolute: 0 10*3/uL (ref 0.0–0.1)
Basophils Relative: 0 %
Eosinophils Absolute: 0 10*3/uL (ref 0.0–0.5)
Eosinophils Relative: 0 %
HCT: 23.3 % — ABNORMAL LOW (ref 39.0–52.0)
Hemoglobin: 8.1 g/dL — ABNORMAL LOW (ref 13.0–17.0)
Immature Granulocytes: 1 %
Lymphocytes Relative: 4 %
Lymphs Abs: 0.5 10*3/uL — ABNORMAL LOW (ref 0.7–4.0)
MCH: 32.9 pg (ref 26.0–34.0)
MCHC: 34.8 g/dL (ref 30.0–36.0)
MCV: 94.7 fL (ref 80.0–100.0)
Monocytes Absolute: 0.5 10*3/uL (ref 0.1–1.0)
Monocytes Relative: 4 %
Neutro Abs: 9.8 10*3/uL — ABNORMAL HIGH (ref 1.7–7.7)
Neutrophils Relative %: 91 %
Platelets: 166 10*3/uL (ref 150–400)
RBC: 2.46 MIL/uL — ABNORMAL LOW (ref 4.22–5.81)
RDW: 13.7 % (ref 11.5–15.5)
WBC: 10.8 10*3/uL — ABNORMAL HIGH (ref 4.0–10.5)
nRBC: 0 % (ref 0.0–0.2)

## 2022-09-25 LAB — MAGNESIUM: Magnesium: 2.1 mg/dL (ref 1.7–2.4)

## 2022-09-25 LAB — TYPE AND SCREEN
ABO/RH(D): A POS
Antibody Screen: NEGATIVE
Unit division: 0

## 2022-09-25 LAB — BPAM RBC
Blood Product Expiration Date: 202401162359
ISSUE DATE / TIME: 202312271048
Unit Type and Rh: 6200

## 2022-09-25 LAB — C-REACTIVE PROTEIN: CRP: 20.6 mg/dL — ABNORMAL HIGH (ref <1.0)

## 2022-09-25 LAB — HEMOGLOBIN AND HEMATOCRIT, BLOOD
HCT: 24 % — ABNORMAL LOW (ref 39.0–52.0)
Hemoglobin: 8.5 g/dL — ABNORMAL LOW (ref 13.0–17.0)

## 2022-09-25 MED ORDER — POLYETHYLENE GLYCOL 3350 17 G PO PACK
17.0000 g | PACK | Freq: Every day | ORAL | Status: DC
  Administered 2022-09-25: 17 g via ORAL
  Filled 2022-09-25: qty 1

## 2022-09-25 NOTE — Progress Notes (Signed)
[facility] Progress Note   Subjective:   Feeling ok this AM.  No voiding difficulty.  Tolerated po intake yesterday.  UOP 871m yesterday up from 2520mday prior. No dysgeusia or nausea.  2 sons and wife in room  Objective Vitals:   09/24/22 1603 09/24/22 1644 09/24/22 1954 09/25/22 0436  BP: 95/61 109/63 109/63 124/65  Pulse: (!) 103  (!) 117 (!) 102  Resp: '18  19 17  '$ Temp:  97.8 F (36.6 C) 98.8 F (37.1 C) 97.6 F (36.4 C)  TempSrc:  Oral Oral Oral  SpO2: 93% 93% 93% 94%   Physical Exam Gen: obese man who is comfortable in bed  Eyes: aniceteric ENT: MM dry CV: RRR no rub  Abd:  mild TTP, soft s/p lap chole Lungs: clear today, no coughing GU: no foley Extr: trace generalized edema Neuro: AOx3, nonfocal, no asterixis  Additional Objective Labs: Basic Metabolic Panel: Recent Labs  Lab 09/23/22 0338 09/24/22 0329 09/25/22 0114  NA 130* 130* 130*  K 4.6 5.3* 4.1  CL 97* 99 98  CO2 19* 18* 19*  GLUCOSE 90 177* 148*  BUN 49* 66* 75*  CREATININE 5.00* 6.06* 6.22*  CALCIUM 7.9* 8.0* 7.7*    Liver Function Tests: Recent Labs  Lab 09/23/22 0338 09/24/22 0329 09/25/22 0114  AST 19 65* 55*  ALT 18 44 48*  ALKPHOS 40 31* 36*  BILITOT 1.0 0.9 0.8  PROT 5.9* 5.7* 5.8*  ALBUMIN 2.3* 2.2* 2.1*    Recent Labs  Lab 09/21/22 0810  LIPASE 50    CBC: Recent Labs  Lab 09/22/22 0552 09/23/22 0338 09/24/22 0329 09/24/22 1548 09/25/22 0114  WBC 6.7 8.6 8.5 11.3* 10.8*  NEUTROABS  --  7.3 7.7  --  9.8*  HGB 8.9* 8.7* 7.1* 8.1* 8.1*  HCT 25.5* 25.0* 20.1* 23.3* 23.3*  MCV 98.1 97.3 96.2 94.7 94.7  PLT 146* 150 141* 168 166    Blood Culture    Component Value Date/Time   SDES OTHER 09/23/2022 1150   SPECREQUEST  ASCITES 09/23/2022 1150   CULT  09/23/2022 1150    RARE KLEBSIELLA PNEUMONIAE CULTURE REINCUBATED FOR BETTER GROWTH SUSCEPTIBILITIES TO FOLLOW Performed [facility] [place] [id number]   REPTSTATUS PENDING 09/23/2022 1150    Cardiac Enzymes: No results for input(s): "CKTOTAL", "CKMB", "CKMBINDEX", "TROPONINI" in the last 168 hours. CBG: Recent Labs  Lab 09/22/22 2215  GLUCAP 115*    Iron Studies: No results for input(s): "IRON", "TIBC", "TRANSFERRIN", "FERRITIN" in the last 72 hours. '@lablastinr3'$ @ Studies/Results: [place]  Result Date: 09/23/2022 CLINICAL DATA:  Acute kidney injury EXAM: RENAL / URINARY TRACT ULTRASOUND COMPLETE COMPARISON:  09/21/2022 FINDINGS: Right Kidney: Renal measurements: 10.4 x 5.8 x 6.5 cm = volume: 202.6. mL. Echogenicity within normal limits. Mild renal cortical thinning. No solid mass or hydronephrosis visualized. Right interpolar renal cyst measuring 3.5 cm. 5.6 cm right inferior pole renal cyst. Left Kidney: Renal measurements: 10.6 x 5 x 5.3 cm = volume: 146.5 mL. Echogenicity within normal limits. Mild renal cortical thinning. No solid mass or hydronephrosis visualized. 2 cm left inferior pole renal cyst. Bladder: Appears normal for degree of bladder distention. Other: None. IMPRESSION: 1. No obstructive uropathy. 2. Mild bilateral renal cortical thinning as can be seen with medical renal disease. 3. Bilateral renal cysts. Electronically Signed   By: HeKathreen Devoid.D.   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 Cira Servant.  Baseline in the mid to high 2s lately now with acute worsening to 6.  Renal US ok.  Rec'd IV contrast - suspect this is contrast nephropathy  +/- ATN in setting of acute cholecystitis.  Currently no indications for RRT and his improving UOP is encouraging.  Will keep him NPO pMN in case needs catheter placed tomorrow but doubt will be the case.   Hold further hydration at this time given development of mild edema but I don't see a use for diuretics at this point. Continue bladder scans. Follow I/Os, daily labs, avoid nephrotoxins.   **hyperkalemia, mild: resolved w lokelma yesterday   **acute cholecystitis:  s/p lap chole today, on pip/tazo   **COVID infection: mild course s/p antiviral; isolation ends 12/30.   **A fib:  on outpt amio, BB.  Eliquis on hold s/p surgery.    **HTN: BPs acceptable currently   **OSA on CPAP.   Jannifer Hick MD 09/25/2022, 7:07 AM  [facility] Pager: [phone]

## 2022-09-25 NOTE — Progress Notes (Signed)
Physical Therapy Treatment Patient Details Name: Erik Perez MRN: [id number] DOB: 11-12-1945 Today's Date: 09/25/2022   History of Present Illness 76 y.o. male admitted 09/21/22 with abdominal pain. Workup revealed acute cholecystitis. Plan for lap chole 12/26. Known (+) COVID-19 dx 09/17/22. Other PMH includes CAD, AAA, CKD, CKD, afibn on Eliquis, HTN, HLD, OSA on CPAP.    PT Comments    Pt was seen for gait in his room and practice for steps to enter house on portable version in the room.  Pt is being visited by his son, who is observing and asking questions to be helpful at home.  Has a gait belt for home, and should be able to manage steps with use of pursed lip breathing.  Resting O2 sat was 92% but after exertion and coughing was 94% on room air.  Will anticipate a successful return to home with pt demonstrating both ability to walk on a sidewalk and to get up steps to enter home from either entrance.  Follow acutely for goals of PT in plan of care.  Recommendations for follow up therapy are one component of a multi-disciplinary discharge planning process, led by the attending physician.  Recommendations may be updated based on patient status, additional functional criteria and insurance authorization.  Follow Up Recommendations  Home health PT     Assistance Recommended at Discharge Intermittent Supervision/Assistance  Patient can return home with the following A little help with bathing/dressing/bathroom;Assistance with cooking/housework   Equipment Recommendations  None recommended by PT    Recommendations for Other Services       Precautions / Restrictions Precautions Precautions: Fall Precaution Comments: watch sats on room air Restrictions Weight Bearing Restrictions: No     Mobility  Bed Mobility               General bed mobility comments: up to recliner when PT arrives    Transfers Overall transfer level: Needs assistance Equipment used: Rolling  walker (2 wheels) Transfers: Sit to/from Stand Sit to Stand: Min guard           General transfer comment: minor cues for hand placement every trial    Ambulation/Gait Ambulation/Gait assistance: Min guard Gait Distance (Feet): 45 Feet Assistive device: Rolling walker (2 wheels) Gait Pattern/deviations: Step-through pattern, Decreased stride length, Wide base of support Gait velocity: Decreased Gait velocity interpretation: <1.31 ft/sec, indicative of household ambulator Pre-gait activities: standing balance ck General Gait Details: took his time and required extra time to turn around as he would walk into the corner and then turn.  RW is a bit bulky for his room space   Stairs Stairs: Yes Stairs assistance: Min guard Stair Management: Backwards, Forwards, Step to pattern, With walker Number of Stairs: 10 General stair comments: pt desaturates unless he uses pursed lip breathing initially then was able to maintain 90% or greater sats   Wheelchair Mobility    Modified Rankin (Stroke Patients Only)       Balance Overall balance assessment: Needs assistance Sitting-balance support: Feet supported Sitting balance-Leahy Scale: Good     Standing balance support: Bilateral upper extremity supported, During functional activity Standing balance-Leahy Scale: Fair Standing balance comment: fair- without walker dynamically                            Cognition Arousal/Alertness: Awake/alert Behavior During Therapy: WFL for tasks assessed/performed Overall Cognitive Status: Within Functional Limits for tasks assessed  Exercises      General Comments General comments (skin integrity, edema, etc.): Pt maintained sats for gait on RW but on steps required a bit of pursed lip breathing to control his sat in 90+% range      Pertinent Vitals/Pain Pain Assessment Pain Assessment: Faces Faces Pain Scale:  Hurts a little bit Pain Location: abdomen Pain Descriptors / Indicators: Guarding Pain Intervention(s): Premedicated before session, Repositioned, Limited activity within patient's tolerance, Monitored during session    Home Living                          Prior Function            PT Goals (current goals can now be found in the care plan section) Progress towards PT goals: Progressing toward goals    Frequency    Min 3X/week      PT Plan Current plan remains appropriate    Co-evaluation              AM-PAC PT "6 Clicks" Mobility   Outcome Measure  Help needed turning from your back to your side while in a flat bed without using bedrails?: A Little Help needed moving from lying on your back to sitting on the side of a flat bed without using bedrails?: A Little Help needed moving to and from a bed to a chair (including a wheelchair)?: A Little Help needed standing up from a chair using your arms (e.g., wheelchair or bedside chair)?: A Little Help needed to walk in hospital room?: A Little Help needed climbing 3-5 steps with a railing? : A Little 6 Click Score: 18    End of Session Equipment Utilized During Treatment: Gait belt Activity Tolerance: Patient tolerated treatment well Patient left: in chair;with call bell/phone within reach;with family/visitor present Nurse Communication: Mobility status PT Visit Diagnosis: Other abnormalities of gait and mobility (R26.89);Muscle weakness (generalized) (M62.81)     Time: 7169-6789 PT Time Calculation (min) (ACUTE ONLY): 26 min  Charges:  $Gait Training: 8-22 mins $Therapeutic Activity: 8-22 mins     Ramond Dial 09/25/2022, 3:45 PM  Mee Hives, PT PhD Acute Rehab Dept. Number: [place] and [place]

## 2022-09-25 NOTE — Progress Notes (Signed)
Progress Note  2 Days Post-Op  Subjective: Pt overall doing well. Tolerating diet and pain well controlled. Drain output remains bloody but trending down. Wife and sons at bedside this AM.   Objective: Vital signs in last 24 hours: Temp:  [97.5 F (36.4 C)-98.8 F (37.1 C)] 97.6 F (36.4 C) (12/28 0902) Pulse Rate:  [76-117] 78 (12/28 0902) Resp:  [16-19] 17 (12/28 0436) BP: (95-136)/(48-65) 136/58 (12/28 0902) SpO2:  [92 %-97 %] 97 % (12/28 0902) Last BM Date : 09/18/22  Intake/Output from previous day: 12/27 0701 - 12/28 0700 In: 367.5 [Blood:367.5] Out: 950 [Urine:800; Drains:150] Intake/Output this shift: Total I/O In: -  Out: 24 [Urine:120]  PE: General: pleasant, WD, obese male who is laying in bed in NAD HEENT: sclera anicteric  Lungs: Respiratory effort nonlabored Abd: soft, NT, mild distention, drain with bloody fluid, incisions C/D/I Psych: A&Ox3 with an appropriate affect.    Lab Results:  Recent Labs    09/24/22 1548 09/25/22 0114  WBC 11.3* 10.8*  HGB 8.1* 8.1*  HCT 23.3* 23.3*  PLT 168 166    BMET Recent Labs    09/24/22 0329 09/25/22 0114  NA 130* 130*  K 5.3* 4.1  CL 99 98  CO2 18* 19*  GLUCOSE 177* 148*  BUN 66* 75*  CREATININE 6.06* 6.22*  CALCIUM 8.0* 7.7*    PT/INR No results for input(s): "LABPROT", "INR" in the last 72 hours.  CMP     Component Value Date/Time   NA 130 (L) 09/25/2022 0114   NA 143 09/04/2022 1043   K 4.1 09/25/2022 0114   CL 98 09/25/2022 0114   CO2 19 (L) 09/25/2022 0114   GLUCOSE 148 (H) 09/25/2022 0114   BUN 75 (H) 09/25/2022 0114   BUN 37 (H) 09/04/2022 1043   CREATININE 6.22 (H) 09/25/2022 0114   CALCIUM 7.7 (L) 09/25/2022 0114   PROT 5.8 (L) 09/25/2022 0114   ALBUMIN 2.1 (L) 09/25/2022 0114   AST 55 (H) 09/25/2022 0114   ALT 48 (H) 09/25/2022 0114   ALKPHOS 36 (L) 09/25/2022 0114   BILITOT 0.8 09/25/2022 0114   GFRNONAA 9 (L) 09/25/2022 0114   GFRAA  06/07/2008 0500    >60         The eGFR has been calculated using the MDRD equation. This calculation has not been validated in all clinical   Lipase     Component Value Date/Time   LIPASE 50 09/21/2022 0810       Studies/Results: US RENAL  Result Date: 09/23/2022 CLINICAL DATA:  Acute kidney injury EXAM: RENAL / URINARY TRACT ULTRASOUND COMPLETE COMPARISON:  09/21/2022 FINDINGS: Right Kidney: Renal measurements: 10.4 x 5.8 x 6.5 cm = volume: 202.6. mL. Echogenicity within normal limits. Mild renal cortical thinning. No solid mass or hydronephrosis visualized. Right interpolar renal cyst measuring 3.5 cm. 5.6 cm right inferior pole renal cyst. Left Kidney: Renal measurements: 10.6 x 5 x 5.3 cm = volume: 146.5 mL. Echogenicity within normal limits. Mild renal cortical thinning. No solid mass or hydronephrosis visualized. 2 cm left inferior pole renal cyst. Bladder: Appears normal for degree of bladder distention. Other: None. IMPRESSION: 1. No obstructive uropathy. 2. Mild bilateral renal cortical thinning as can be seen with medical renal disease. 3. Bilateral renal cysts. Electronically Signed   By: Kathreen Devoid M.D.   On: 09/23/2022 11:35    Anti-infectives: Anti-infectives (From admission, onward)    Start     Dose/Rate Route Frequency Ordered Stop  09/23/22 1530  piperacillin-tazobactam (ZOSYN) IVPB 2.25 g        2.25 g 100 mL/hr over 30 Minutes Intravenous Every 6 hours 09/23/22 1443     09/21/22 1400  piperacillin-tazobactam (ZOSYN) IVPB 3.375 g  Status:  Discontinued        3.375 g 12.5 mL/hr over 240 Minutes Intravenous Every 8 hours 09/21/22 1031 09/23/22 1443   09/21/22 1015  molnupiravir EUA (LAGEVRIO) capsule 800 mg  Status:  Discontinued        4 capsule Oral 2 times daily 09/21/22 1000 09/24/22 0759   09/21/22 0830  piperacillin-tazobactam (ZOSYN) IVPB 3.375 g        3.375 g 100 mL/hr over 30 Minutes Intravenous  Once 09/21/22 9741 09/21/22 6384        Assessment/Plan Acute cholecystitis   POD2 S/P laparoscopic cholecystectomy  - drain remains bloody but hgb stable at 8.1 s/p 1 PRBC - recheck h/h at 1200  - tolerating diet - mobilize as tolerated - continue abx  - pt will discharge home with drain - will set up follow up for removal in 10-14 days   FEN: CM/renal diet with fluid restriction, IVF per TRH/renal  VTE: SCDs, would continue to hold eliquis for now given bloody output ID: Zosyn 12/24>>  - below per TRH -  CAD AAA CKD A. Fib on eliquis Carotid stenosis HTN HLD OSA on CPAP COVID+  LOS: 4 days     Norm Parcel, [facility] 09/25/2022, 10:38 AM Please see Amion for pager number during day hours 7:00am-4:30pm

## 2022-09-25 NOTE — Progress Notes (Signed)
[facility] Progress Note Patient: Erik Perez:[id number] DOB: December 06, 1945 DOA: 09/21/2022  DOS: the patient was seen and examined on 09/25/2022  Brief hospital course: 76 y.o. male with medical history significant of CAD, AAA, CKD, afib on Eliquis (last dose night), carotid stenosis, HTN, HLD, and OSA on CPAP presenting with abdominal pain. Patient reports that he has had mild SOB and fatigue since diagnosed with afib in October.  He became acutely ill with cough, congestion and was diagnosed with COVID on 12/20, started treatment with molnupiravir that day (completing 5 days today).  He also had a known AAA.  He started about 0600 with acute R-sided abdominal pain that radiated to his R shoulder.  The pain later spread across his mid abdomen to both flanks, him to the ER where workup was suggestive of acute cholecystitis, he was seen by general surgery and admitted by [facility].   Assessment and Plan: Acute cholecystitis Underwent lap chole on 12/26. On IV antibiotics.  Cultures growing Klebsiella. Management per general surgery  Postop acute blood loss anemia. H&H relatively stable. Required 1 PRBC transfusion. Currently holding anticoagulation.  Toxic encephalopathy from opioids. Unintentional narcotic overdose night of 09/22/2022.  Resolved after Narcan.  As needed Narcan on board.  Cautious use of narcotics.     COVID 19 pneumonia present on admission.   Completed molnupiravir. Continue on 10-day isolation which is ending 09/27/2022.   Paroxysmal A-fib with [place] vasc 2 score of greater than 4. 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   Treated with Lasix, continue beta-blocker.   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  , Nephrology consult appreciated. Will need HD.   HTN  Current regimen includes Cardura and Lopressor. Blood pressure stable.   HLD   Continue atorvastatin   OSA   Continue CPAP nightly   Gout   Continue allopurinol   Infrarenal abdominal aortic aneurysm measures 3.7 cm - incidental, defer to PCP for monitoring closely.  Beta-blocker if he can tolerate.   CTA with an indeterminate exophytic lesion along the inferior pole of L kidney - needs further evaluation as an outpatient with abdominal MRI, defer to PCP.    Subjective: No acute complaint.  No nausea no vomiting no fever no chills.  Passing gas but no BM.  Physical Exam: General: in Mild distress, No Rash Cardiovascular: S1 and S2 Present, No Murmur Respiratory: Good respiratory effort, Bilateral Air entry present. No Crackles, No wheezes Abdomen: Bowel Sound present but sluggish, No tenderness Extremities: Bilateral trace edema Neuro: Alert and oriented x3, no new focal deficit  Data Reviewed: I have Reviewed nursing notes, Vitals, and Lab results. Since last encounter, pertinent lab results CBC and BMP   . I have ordered test including CBC and BMP  .   Disposition: Status is: Inpatient Remains inpatient appropriate because: Will need HD, also need to have a BM  SCDs Start: 09/21/22 0954   Family Communication: Son at bedside Level of care: Med-Surg Continue MedSurg Vitals:   09/24/22 1954 09/25/22 0436 09/25/22 0902 09/25/22 1537  BP: 109/63 124/65 (!) 136/58 (!) 120/51  Pulse: (!) 117 (!) 102 78 69  Resp: 19 17    Temp: 98.8 F (37.1 C) 97.6 F (36.4 C) 97.6 F (36.4 C) 98.5 F (36.9 C)  TempSrc: Oral Oral Oral Oral  SpO2: 93% 94% 97% 95%     Author: Berle Mull, MD 09/25/2022 7:35 PM  Please look on [email or web address] to find out who is on call.

## 2022-09-25 NOTE — Progress Notes (Signed)
PT Cancellation Note  Patient Details Name: Erik Perez MRN: [id number] DOB: 1946-02-14   Cancelled Treatment:    Reason Eval/Treat Not Completed: Erik (comment) (Pt just worked with mobility specialist and wanted PT to return later this pm. Will check back as able.)   [name] 09/25/2022, 12:33 PM [name],PT Acute Rehab Services [phone]

## 2022-09-25 NOTE — Progress Notes (Signed)
Mobility Specialist Progress Note:   09/25/22 1155  Mobility  Activity Ambulated with assistance in room  Level of Assistance Standby assist, set-up cues, supervision of patient - no hands on  Assistive Device Front wheel walker  Distance Ambulated (ft) 200 ft  Activity Response Tolerated well  Mobility Referral Yes  $Mobility charge 1 Mobility   Pt received in bed and eager. No complaints. Pt left in chair with all needs met, call bell in reach, and sons in room.   Andrey Campanile Mobility Specialist Please contact via SecureChat or  Rehab office at [phone]

## 2022-09-26 DIAGNOSIS — I480 Paroxysmal atrial fibrillation: Secondary | ICD-10-CM

## 2022-09-26 DIAGNOSIS — U071 COVID-19: Secondary | ICD-10-CM

## 2022-09-26 DIAGNOSIS — K810 Acute cholecystitis: Secondary | ICD-10-CM | POA: Diagnosis not present

## 2022-09-26 DIAGNOSIS — N184 Chronic kidney disease, stage 4 (severe): Secondary | ICD-10-CM

## 2022-09-26 LAB — CBC WITH DIFFERENTIAL/PLATELET
Abs Immature Granulocytes: 0.08 10*3/uL — ABNORMAL HIGH (ref 0.00–0.07)
Basophils Absolute: 0 10*3/uL (ref 0.0–0.1)
Basophils Relative: 0 %
Eosinophils Absolute: 0 10*3/uL (ref 0.0–0.5)
Eosinophils Relative: 0 %
HCT: 24.4 % — ABNORMAL LOW (ref 39.0–52.0)
Hemoglobin: 8.7 g/dL — ABNORMAL LOW (ref 13.0–17.0)
Immature Granulocytes: 1 %
Lymphocytes Relative: 6 %
Lymphs Abs: 0.5 10*3/uL — ABNORMAL LOW (ref 0.7–4.0)
MCH: 33.1 pg (ref 26.0–34.0)
MCHC: 35.7 g/dL (ref 30.0–36.0)
MCV: 92.8 fL (ref 80.0–100.0)
Monocytes Absolute: 0.7 10*3/uL (ref 0.1–1.0)
Monocytes Relative: 8 %
Neutro Abs: 7.7 10*3/uL (ref 1.7–7.7)
Neutrophils Relative %: 85 %
Platelets: 186 10*3/uL (ref 150–400)
RBC: 2.63 MIL/uL — ABNORMAL LOW (ref 4.22–5.81)
RDW: 13.8 % (ref 11.5–15.5)
WBC: 9.1 10*3/uL (ref 4.0–10.5)
nRBC: 0 % (ref 0.0–0.2)

## 2022-09-26 LAB — COMPREHENSIVE METABOLIC PANEL
ALT: 48 U/L — ABNORMAL HIGH (ref 0–44)
AST: 47 U/L — ABNORMAL HIGH (ref 15–41)
Albumin: 2.3 g/dL — ABNORMAL LOW (ref 3.5–5.0)
Alkaline Phosphatase: 35 U/L — ABNORMAL LOW (ref 38–126)
Anion gap: 18 — ABNORMAL HIGH (ref 5–15)
BUN: 75 mg/dL — ABNORMAL HIGH (ref 8–23)
CO2: 17 mmol/L — ABNORMAL LOW (ref 22–32)
Calcium: 8 mg/dL — ABNORMAL LOW (ref 8.9–10.3)
Chloride: 101 mmol/L (ref 98–111)
Creatinine, Ser: 5.88 mg/dL — ABNORMAL HIGH (ref 0.61–1.24)
GFR, Estimated: 9 mL/min — ABNORMAL LOW (ref >60)
Glucose, Bld: 108 mg/dL — ABNORMAL HIGH (ref 70–99)
Potassium: 4 mmol/L (ref 3.5–5.1)
Sodium: 136 mmol/L (ref 135–145)
Total Bilirubin: 0.5 mg/dL (ref 0.3–1.2)
Total Protein: 5.9 g/dL — ABNORMAL LOW (ref 6.5–8.1)

## 2022-09-26 LAB — C-REACTIVE PROTEIN: CRP: 13.6 mg/dL — ABNORMAL HIGH (ref <1.0)

## 2022-09-26 LAB — MAGNESIUM: Magnesium: 2.2 mg/dL (ref 1.7–2.4)

## 2022-09-26 MED ORDER — HEPARIN SODIUM (PORCINE) 5000 UNIT/ML IJ SOLN
5000.0000 [IU] | Freq: Three times a day (TID) | INTRAMUSCULAR | Status: DC
  Administered 2022-09-26 – 2022-09-27 (×3): 5000 [IU] via SUBCUTANEOUS
  Filled 2022-09-26 (×3): qty 1

## 2022-09-26 MED ORDER — SODIUM BICARBONATE 650 MG PO TABS
650.0000 mg | ORAL_TABLET | Freq: Three times a day (TID) | ORAL | Status: DC
  Administered 2022-09-26 – 2022-09-29 (×10): 650 mg via ORAL
  Filled 2022-09-26 (×11): qty 1

## 2022-09-26 NOTE — Progress Notes (Signed)
[facility] Progress Note Patient: Erik Perez ZRA:[id number] DOB: 08-May-1946 DOA: 09/21/2022  DOS: the patient was seen and examined on 09/26/2022  Brief hospital course:  76 y.o. male with medical history significant of CAD, AAA, CKD, afib on Eliquis (last dose night), carotid stenosis, HTN, HLD, and OSA on CPAP presenting with abdominal pain. Patient reports that he has had mild SOB and fatigue since diagnosed with afib in October.  He became acutely ill with cough, congestion and was diagnosed with COVID on 12/20, started treatment with molnupiravir that day (completing 5 days today).  He also had a known AAA.  He started about 0600 with acute R-sided abdominal pain that radiated to his R shoulder.  The pain later spread across his mid abdomen to both flanks, him to the ER where workup was suggestive of acute cholecystitis, he was seen by general surgery and admitted by [facility].   Assessment and Plan: Acute cholecystitis Underwent lap chole on 12/26. On IV antibiotics.  Cultures growing Klebsiella. Management per general surgery -He is cleared by general surgery to resume anticoagulation, but I will hold overnight is unsure if he will need HD access placement or not.  Postop acute blood loss anemia. H&H relatively stable. Required 1 PRBC transfusion. Currently holding anticoagulation.  Please see above discussion  Toxic encephalopathy from opioids. Unintentional narcotic overdose night of 09/22/2022.  Resolved after Narcan.  As needed Narcan on board.  Cautious use of narcotics.     COVID 19 pneumonia present on admission.   Completed molnupiravir. Continue on 10-day isolation which is ending 09/27/2022.   Paroxysmal A-fib with [place] vasc 2 score of greater than 4. Eliquis on hold for surgery, last dose 09/20/2022,  continue beta-blocker and amiodarone, -Resume Eliquis in 1 to 2 days if no procedure anticipated regarding HD access   Acute on chronic diastolic CHF EF 54% on  recent echocardiogram.   Treated with Lasix, continue beta-blocker.   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  , Nephrology consult appreciated. -This is likely due to contrast nephropathy+/-ATN in the setting of acute cholecystitis, COVID and acute blood loss anemia. -No final decision yet about HD, but so far will hold on HD access given good urine output and creatinine is trending down. -Will keep holding Eliquis till final decision is made regarding HD access if indicated -Continue with bicarb   HTN  Current regimen includes Cardura and Lopressor. Blood pressure stable.   HLD   Continue atorvastatin   OSA   Continue CPAP nightly   Gout   Continue allopurinol   Infrarenal abdominal aortic aneurysm measures 3.7 cm - incidental, defer to PCP for monitoring closely.  Beta-blocker if he can tolerate.   CTA with an indeterminate exophytic lesion along the inferior pole of L kidney - needs further evaluation as an outpatient with abdominal MRI, defer to PCP.    Subjective: No acute complaint.  No nausea no vomiting no fever no chills.  Passing gas but no BM.  Physical Exam:  Awake Alert, Oriented X 3, No new F.N deficits, frail Symmetrical Chest wall movement, Good air movement bilaterally, CTAB RRR,No Gallops,Rubs or new Murmurs, No Parasternal Heave +ve B.Sounds, Abd Soft, No tenderness, right JP drain present No Cyanosis, Clubbing or edema, No  new Rash or bruise     Data Reviewed: I have Reviewed nursing notes, Vitals, and Lab results. Since last encounter, pertinent lab results CBC and BMP   . I have ordered test including CBC and BMP  .   Disposition: Status is: Inpatient Remains inpatient appropriate because: Will need HD, also need to have a BM  SCDs Start:  09/21/22 0954   Family Communication: both sons at bedside Level of care: Med-Surg Continue MedSurg Vitals:   09/25/22 1537 09/25/22 2024 09/26/22 0512 09/26/22 0907  BP: (!) 120/51 (!) 130/54 (!) 131/56 126/63  Pulse: 69 76 69 75  Resp:  '17 16 16  '$ Temp: 98.5 F (36.9 C) 98.2 F (36.8 C) 98.4 F (36.9 C) 98.3 F (36.8 C)  TempSrc: Oral Oral  Oral  SpO2: 95% 95% 100% 96%     Author: Phillips Climes, MD 09/26/2022 1:43 PM  Please look on [email or web address] to find out who is on call.

## 2022-09-26 NOTE — Progress Notes (Signed)
PT Cancellation Note  Patient Details Name: Erik Perez MRN: [id number] DOB: 1945-12-20   Cancelled Treatment:    Reason Eval/Treat Not Completed: Fatigue/lethargy limiting ability to participate - pt sleeping soundly upon PT arrival, per son at bedside pt has been up and moving multiple times today.   Stacie Glaze, PT DPT Acute Rehabilitation Services Pager [phone]  Office [phone]    [name] 09/26/2022, 4:21 PM

## 2022-09-26 NOTE — Progress Notes (Signed)
[facility] Progress Note   Subjective:  no acute events overnight. Had some diarrhea after receiving miralax. Does report that he is urinating more. Discussed with both sons at the bedside. Not all urine caught for measurements given bowel movements. He does report that compression pumps do help with the swelling in his legs.  Objective Vitals:   09/25/22 1537 09/25/22 2024 09/26/22 0512 09/26/22 0907  BP: (!) 120/51 (!) 130/54 (!) 131/56 126/63  Pulse: 69 76 69 75  Resp:  '17 16 16  '$ Temp: 98.5 F (36.9 C) 98.2 F (36.8 C) 98.4 F (36.9 C) 98.3 F (36.8 C)  TempSrc: Oral Oral  Oral  SpO2: 95% 95% 100% 96%   Physical Exam Gen: obese, NAD CV: RRR no rub  Abd:  soft s/p lap chole Lungs: clear today, no coughing Extr: trace generalized edema Neuro: AOx3, nonfocal, no asterixis  Additional Objective Labs: Basic Metabolic Panel: Recent Labs  Lab 09/24/22 0329 09/25/22 0114 09/26/22 0241  NA 130* 130* 136  K 5.3* 4.1 4.0  CL 99 98 101  CO2 18* 19* 17*  GLUCOSE 177* 148* 108*  BUN 66* 75* 75*  CREATININE 6.06* 6.22* 5.88*  CALCIUM 8.0* 7.7* 8.0*   Liver Function Tests: Recent Labs  Lab 09/24/22 0329 09/25/22 0114 09/26/22 0241  AST 65* 55* 47*  ALT 44 48* 48*  ALKPHOS 31* 36* 35*  BILITOT 0.9 0.8 0.5  PROT 5.7* 5.8* 5.9*  ALBUMIN 2.2* 2.1* 2.3*   Recent Labs  Lab 09/21/22 0810  LIPASE 50   CBC: Recent Labs  Lab 09/23/22 0338 09/24/22 0329 09/24/22 1548 09/25/22 0114 09/25/22 1301 09/26/22 0241  WBC 8.6 8.5 11.3* 10.8*  --  9.1  NEUTROABS 7.3 7.7  --  9.8*  --  7.7  HGB 8.7* 7.1* 8.1* 8.1* 8.5* 8.7*  HCT 25.0* 20.1* 23.3* 23.3* 24.0* 24.4*  MCV 97.3 96.2 94.7 94.7  --  92.8  PLT 150 141* 168 166  --  186   Blood Culture    Component Value Date/Time   SDES OTHER 09/23/2022 1150   SPECREQUEST  ASCITES 09/23/2022 1150   CULT  09/23/2022 1150    RARE KLEBSIELLA PNEUMONIAE NO ANAEROBES ISOLATED; CULTURE IN PROGRESS FOR 5 DAYS     REPTSTATUS PENDING 09/23/2022 1150    Cardiac Enzymes: No results for input(s): "CKTOTAL", "CKMB", "CKMBINDEX", "TROPONINI" in the last 168 hours. CBG: Recent Labs  Lab 09/22/22 2215  GLUCAP 115*   Iron Studies: No results for input(s): "IRON", "TIBC", "TRANSFERRIN", "FERRITIN" in the last 72 hours. '@lablastinr3'$ @ Studies/Results: No results found. 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 Cira Servant.  Baseline in the mid to high 2s lately, had acute worsening to 6.  Renal US ok.  Rec'd IV contrast - suspect this is contrast nephropathy +/- ATN in setting of acute cholecystitis, COVID, ABLA.  Currently no  indications for RRT, not exhibiting any uremic symptoms and Cr is better this AM (down to 5.88). Will d/c NPO status and resume diet, hopeful that his kidney function will continue to improve. Hold further hydration at this time given development of mild edema but I don't see a use for diuretics at this point. Continue bladder scans. Follow I/Os, daily labs, avoid nephrotoxins.   **hyperkalemia, mild: resolved, CTM for now   **acute cholecystitis:  s/p lap chole today, on pip/tazo   **COVID infection: mild course s/p antiviral; isolation ends 12/30.  **Acidosis -secondary to AKI and diarrhea -will start PO bicarb   **A fib:  on outpt amio, BB.  Eliquis on hold s/p surgery.    **HTN: BPs acceptable currently   **OSA on CPAP.   Gean Quint, MD [facility]

## 2022-09-26 NOTE — Progress Notes (Signed)
Mobility Specialist Progress Note:   09/26/22 1257  Mobility  Activity Ambulated with assistance in room  Level of Assistance Standby assist, set-up cues, supervision of patient - no hands on  Assistive Device Front wheel walker  Distance Ambulated (ft) 250 ft  Activity Response Tolerated well  Mobility Referral Yes  $Mobility charge 1 Mobility   Pt received in chair and eager. No complaints of pain or SOB. Independent with pericare. Pt left in chair with all needs met, call bell in reach, and sons in room.   Andrey Campanile Mobility Specialist Please contact via SecureChat or  Rehab office at [phone]

## 2022-09-26 NOTE — Progress Notes (Signed)
Occupational Therapy Treatment Patient Details Name: Erik Perez MRN: [id number] DOB: January 20, 1946 Today's Date: 09/26/2022   History of present illness 76 y.o. male admitted 09/21/22 with abdominal pain. Workup revealed acute cholecystitis. Plan for lap chole 12/26. Known (+) COVID-19 dx 09/17/22. Other PMH includes CAD, AAA, CKD, CKD, afibn on Eliquis, HTN, HLD, OSA on CPAP.   OT comments  Pt. Seen for skilled OT treatment session.  Sons present and very helpful and eager to assist their father as needed.  Pt. Able to to complete LB dressing task eob, simulated shower stall transfer and toileting task in b.room.  education provided on energy conservation and creating a balance of activity and rest throughout the day.  Min guard A for all tasks. Pt. Progressing well.    Recommendations for follow up therapy are one component of a multi-disciplinary discharge planning process, led by the attending physician.  Recommendations may be updated based on patient status, additional functional criteria and insurance authorization.    Follow Up Recommendations  Home health OT     Assistance Recommended at Discharge Set up Supervision/Assistance  Patient can return home with the following  A little help with walking and/or transfers;A little help with bathing/dressing/bathroom;Assistance with cooking/housework   Equipment Recommendations  None recommended by OT    Recommendations for Other Services      Precautions / Restrictions Precautions Precautions: Fall Precaution Comments: watch sats on room air       Mobility Bed Mobility               General bed mobility comments: seated eob at beginning and end of session    Transfers Overall transfer level: Needs assistance Equipment used: Rolling walker (2 wheels) Transfers: Sit to/from Stand, Bed to chair/wheelchair/BSC Sit to Stand: Min guard                 Balance                                            ADL either performed or assessed with clinical judgement   ADL Overall ADL's : Needs assistance/impaired                     Lower Body Dressing: Min guard;Sitting/lateral leans Lower Body Dressing Details (indicate cue type and reason): able demo turning in bed to L/R to reach feet, educated on not bending forward or dressing in standing Toilet Transfer: Min guard;Rolling walker (2 wheels);Ambulation;Regular Toilet   Toileting- Water quality scientist and Hygiene: Min guard;Sitting/lateral lean   Tub/ Shower Transfer: Walk-in shower;Min guard;Cueing for sequencing;Cueing for safety;Shower seat;Rolling walker (2 wheels);Grab bars Tub/Shower Transfer Details (indicate cue type and reason): reports small ledge with 3 grab bars in different locations and a shower chair in place along with hand held shower.  able to return demo over simulated ledge for in/out using rw Functional mobility during ADLs: Min guard;Rolling walker (2 wheels) General ADL Comments: sons present and active in pts. session. very helpful and eager to learn and assist pt.    Extremity/Trunk Assessment              Vision       Perception     Praxis      Cognition Arousal/Alertness: Awake/alert Behavior During Therapy: WFL for tasks assessed/performed Overall Cognitive Status: Within Functional Limits for tasks assessed  Exercises      Shoulder Instructions       General Comments      Pertinent Vitals/ Pain       Pain Assessment Pain Assessment: No/denies pain  Home Living                                          Prior Functioning/Environment              Frequency  Min 2X/week        Progress Toward Goals  OT Goals(current goals can now be found in the care plan section)  Progress towards OT goals: Progressing toward goals     Plan Discharge plan remains appropriate     Co-evaluation                 AM-PAC OT "6 Clicks" Daily Activity     Outcome Measure   Help from another person eating meals?: None Help from another person taking care of personal grooming?: A Little Help from another person toileting, which includes using toliet, bedpan, or urinal?: A Little Help from another person bathing (including washing, rinsing, drying)?: A Lot Help from another person to put on and taking off regular upper body clothing?: A Little Help from another person to put on and taking off regular lower body clothing?: A Little 6 Click Score: 18    End of Session Equipment Utilized During Treatment: Rolling walker (2 wheels);Gait belt  OT Visit Diagnosis: Unsteadiness on feet (R26.81);Other abnormalities of gait and mobility (R26.89);Muscle weakness (generalized) (M62.81)   Activity Tolerance Patient tolerated treatment well   Patient Left in bed;with family/visitor present;Other (comment) (md present at end of session meeting with pt. and sons)   Nurse Communication Other (comment) (alerted rn that sons state they can take him off of o2 monitor and replace as needed for in room tasks and toileting. report at end of session they will put him back on as well)        Time: [phone] OT Time Calculation (min): 23 min  Charges: OT General Charges $OT Visit: 1 Visit OT Treatments $Self Care/Home Management : 23-37 mins  Sonia Baller, COTA/L Acute Rehabilitation [phone]   Clearnce Sorrel Lorraine-COTA/L 09/26/2022, 9:59 AM

## 2022-09-26 NOTE — Progress Notes (Signed)
Progress Note  3 Days Post-Op  Subjective: Pt overall doing well. Tolerating diet and pain well controlled. Drain output starting to look a little less bloody. Had some diarrhea overnight after miralax. Both sons at bedside this AM.   Objective: Vital signs in last 24 hours: Temp:  [98.2 F (36.8 C)-98.5 F (36.9 C)] 98.3 F (36.8 C) (12/29 0907) Pulse Rate:  [69-76] 75 (12/29 0907) Resp:  [16-17] 16 (12/29 0907) BP: (120-131)/(51-63) 126/63 (12/29 0907) SpO2:  [95 %-100 %] 96 % (12/29 0907) Last BM Date : 09/18/22  Intake/Output from previous day: 12/28 0701 - 12/29 0700 In: -  Out: 610 [Urine:570; Drains:40] Intake/Output this shift: No intake/output data recorded.  PE: General: pleasant, WD, obese male who is laying in bed in NAD HEENT: sclera anicteric  Lungs: Respiratory effort nonlabored Abd: soft, NT, mild distention, drain with bloody fluid and clot in bulb, incisions C/D/I Psych: A&Ox3 with an appropriate affect.    Lab Results:  Recent Labs    09/25/22 0114 09/25/22 1301 09/26/22 0241  WBC 10.8*  --  9.1  HGB 8.1* 8.5* 8.7*  HCT 23.3* 24.0* 24.4*  PLT 166  --  186    BMET Recent Labs    09/25/22 0114 09/26/22 0241  NA 130* 136  K 4.1 4.0  CL 98 101  CO2 19* 17*  GLUCOSE 148* 108*  BUN 75* 75*  CREATININE 6.22* 5.88*  CALCIUM 7.7* 8.0*    PT/INR No results for input(s): "LABPROT", "INR" in the last 72 hours.  CMP     Component Value Date/Time   NA 136 09/26/2022 0241   NA 143 09/04/2022 1043   K 4.0 09/26/2022 0241   CL 101 09/26/2022 0241   CO2 17 (L) 09/26/2022 0241   GLUCOSE 108 (H) 09/26/2022 0241   BUN 75 (H) 09/26/2022 0241   BUN 37 (H) 09/04/2022 1043   CREATININE 5.88 (H) 09/26/2022 0241   CALCIUM 8.0 (L) 09/26/2022 0241   PROT 5.9 (L) 09/26/2022 0241   ALBUMIN 2.3 (L) 09/26/2022 0241   AST 47 (H) 09/26/2022 0241   ALT 48 (H) 09/26/2022 0241   ALKPHOS 35 (L) 09/26/2022 0241   BILITOT 0.5 09/26/2022 0241   GFRNONAA  9 (L) 09/26/2022 0241   GFRAA  06/07/2008 0500    >60        The eGFR has been calculated using the MDRD equation. This calculation has not been validated in all clinical   Lipase     Component Value Date/Time   LIPASE 50 09/21/2022 0810       Studies/Results: No results found.  Anti-infectives: Anti-infectives (From admission, onward)    Start     Dose/Rate Route Frequency Ordered Stop   09/23/22 1530  piperacillin-tazobactam (ZOSYN) IVPB 2.25 g        2.25 g 100 mL/hr over 30 Minutes Intravenous Every 6 hours 09/23/22 1443     09/21/22 1400  piperacillin-tazobactam (ZOSYN) IVPB 3.375 g  Status:  Discontinued        3.375 g 12.5 mL/hr over 240 Minutes Intravenous Every 8 hours 09/21/22 1031 09/23/22 1443   09/21/22 1015  molnupiravir EUA (LAGEVRIO) capsule 800 mg  Status:  Discontinued        4 capsule Oral 2 times daily 09/21/22 1000 09/24/22 0759   09/21/22 0830  piperacillin-tazobactam (ZOSYN) IVPB 3.375 g        3.375 g 100 mL/hr over 30 Minutes Intravenous  Once 09/21/22 0822 09/21/22 0925  Assessment/Plan Acute cholecystitis  POD3 S/P laparoscopic cholecystectomy  - drain starting to clear up some and amount of output trending down, hgb stable - ok to restart eliquis from a surgical standpoint  - tolerating diet - mobilize as tolerated - continue abx  - pt will discharge home with drain - follow up in AVS  FEN: CM/renal diet with fluid restriction, IVF per TRH/renal  VTE: SCDs, ok to resume eliquis ID: Zosyn 12/24>>  - below per TRH -  CAD AAA CKD A. Fib on eliquis Carotid stenosis HTN HLD OSA on CPAP COVID+  LOS: 5 days     Norm Parcel, [facility] 09/26/2022, 9:13 AM Please see Amion for pager number during day hours 7:00am-4:30pm

## 2022-09-27 DIAGNOSIS — K810 Acute cholecystitis: Secondary | ICD-10-CM | POA: Diagnosis not present

## 2022-09-27 DIAGNOSIS — U071 COVID-19: Secondary | ICD-10-CM | POA: Diagnosis not present

## 2022-09-27 DIAGNOSIS — N184 Chronic kidney disease, stage 4 (severe): Secondary | ICD-10-CM | POA: Diagnosis not present

## 2022-09-27 LAB — COMPREHENSIVE METABOLIC PANEL
ALT: 38 U/L (ref 0–44)
AST: 31 U/L (ref 15–41)
Albumin: 2.2 g/dL — ABNORMAL LOW (ref 3.5–5.0)
Alkaline Phosphatase: 30 U/L — ABNORMAL LOW (ref 38–126)
Anion gap: 12 (ref 5–15)
BUN: 69 mg/dL — ABNORMAL HIGH (ref 8–23)
CO2: 20 mmol/L — ABNORMAL LOW (ref 22–32)
Calcium: 8.2 mg/dL — ABNORMAL LOW (ref 8.9–10.3)
Chloride: 107 mmol/L (ref 98–111)
Creatinine, Ser: 5.06 mg/dL — ABNORMAL HIGH (ref 0.61–1.24)
GFR, Estimated: 11 mL/min — ABNORMAL LOW (ref >60)
Glucose, Bld: 103 mg/dL — ABNORMAL HIGH (ref 70–99)
Potassium: 4.1 mmol/L (ref 3.5–5.1)
Sodium: 139 mmol/L (ref 135–145)
Total Bilirubin: 1 mg/dL (ref 0.3–1.2)
Total Protein: 5.7 g/dL — ABNORMAL LOW (ref 6.5–8.1)

## 2022-09-27 LAB — CBC WITH DIFFERENTIAL/PLATELET
Abs Immature Granulocytes: 0.05 10*3/uL (ref 0.00–0.07)
Basophils Absolute: 0 10*3/uL (ref 0.0–0.1)
Basophils Relative: 0 %
Eosinophils Absolute: 0.1 10*3/uL (ref 0.0–0.5)
Eosinophils Relative: 1 %
HCT: 24.5 % — ABNORMAL LOW (ref 39.0–52.0)
Hemoglobin: 8.4 g/dL — ABNORMAL LOW (ref 13.0–17.0)
Immature Granulocytes: 1 %
Lymphocytes Relative: 8 %
Lymphs Abs: 0.5 10*3/uL — ABNORMAL LOW (ref 0.7–4.0)
MCH: 32.9 pg (ref 26.0–34.0)
MCHC: 34.3 g/dL (ref 30.0–36.0)
MCV: 96.1 fL (ref 80.0–100.0)
Monocytes Absolute: 0.6 10*3/uL (ref 0.1–1.0)
Monocytes Relative: 9 %
Neutro Abs: 5 10*3/uL (ref 1.7–7.7)
Neutrophils Relative %: 81 %
Platelets: 185 10*3/uL (ref 150–400)
RBC: 2.55 MIL/uL — ABNORMAL LOW (ref 4.22–5.81)
RDW: 14 % (ref 11.5–15.5)
WBC: 6.2 10*3/uL (ref 4.0–10.5)
nRBC: 0 % (ref 0.0–0.2)

## 2022-09-27 MED ORDER — APIXABAN 5 MG PO TABS
5.0000 mg | ORAL_TABLET | Freq: Two times a day (BID) | ORAL | Status: DC
  Administered 2022-09-27 – 2022-09-29 (×5): 5 mg via ORAL
  Filled 2022-09-27 (×5): qty 1

## 2022-09-27 MED ORDER — HYDROMORPHONE HCL 1 MG/ML IJ SOLN: 0.5000 mg | INTRAMUSCULAR | Status: DC | PRN

## 2022-09-27 MED ORDER — PIPERACILLIN-TAZOBACTAM IN DEX 2-0.25 GM/50ML IV SOLN
2.2500 g | Freq: Three times a day (TID) | INTRAVENOUS | Status: AC
  Administered 2022-09-27 – 2022-09-28 (×4): 2.25 g via INTRAVENOUS
  Filled 2022-09-27 (×4): qty 50

## 2022-09-27 MED ORDER — POLYETHYLENE GLYCOL 3350 17 G PO PACK: 17.0000 g | PACK | Freq: Every day | ORAL | Status: DC | PRN

## 2022-09-27 MED ORDER — OXYCODONE HCL 5 MG PO TABS: 5.0000 mg | ORAL_TABLET | Freq: Four times a day (QID) | ORAL | Status: DC | PRN

## 2022-09-27 MED ORDER — BOOST / RESOURCE BREEZE PO LIQD CUSTOM
1.0000 | Freq: Three times a day (TID) | ORAL | Status: DC
  Administered 2022-09-27 – 2022-09-28 (×3): 1 via ORAL
  Filled 2022-09-27 (×2): qty 1

## 2022-09-27 NOTE — Progress Notes (Signed)
[facility] Progress Note  4 Days Post-Op  Subjective: CC-  Family at bedside. Abdomen sore but no severe pain. Denies n/v. Having bowel function and tolerating diet. Hgb 8.4 from 8.7  Objective: Vital signs in last 24 hours: Temp:  [98.2 F (36.8 C)-98.9 F (37.2 C)] 98.2 F (36.8 C) (12/30 0825) Pulse Rate:  [74-81] 81 (12/30 0825) Resp:  [16-18] 16 (12/30 0825) BP: (126-143)/(55-63) 143/57 (12/30 0825) SpO2:  [94 %-96 %] 94 % (12/30 0825) Last BM Date : 09/26/22  Intake/Output from previous day: 12/29 0701 - 12/30 0700 In: 680 [P.O.:680] Out: 67 [Urine:1; Drains:65; Stool:1] Intake/Output this shift: No intake/output data recorded.  PE: General: Alert, NAD Lungs: Respiratory effort nonlabored Abd: soft, NT, mild distention, drain with serosanguinous fluid and clot in bulb, incisions C/D/I Psych: A&Ox3 with an appropriate affect.   Lab Results:  Recent Labs    09/26/22 0241 09/27/22 0341  WBC 9.1 6.2  HGB 8.7* 8.4*  HCT 24.4* 24.5*  PLT 186 185   BMET Recent Labs    09/26/22 0241 09/27/22 0341  NA 136 139  K 4.0 4.1  CL 101 107  CO2 17* 20*  GLUCOSE 108* 103*  BUN 75* 69*  CREATININE 5.88* 5.06*  CALCIUM 8.0* 8.2*   PT/INR No results for input(s): "LABPROT", "INR" in the last 72 hours. CMP     Component Value Date/Time   NA 139 09/27/2022 0341   NA 143 09/04/2022 1043   K 4.1 09/27/2022 0341   CL 107 09/27/2022 0341   CO2 20 (L) 09/27/2022 0341   GLUCOSE 103 (H) 09/27/2022 0341   BUN 69 (H) 09/27/2022 0341   BUN 37 (H) 09/04/2022 1043   CREATININE 5.06 (H) 09/27/2022 0341   CALCIUM 8.2 (L) 09/27/2022 0341   PROT 5.7 (L) 09/27/2022 0341   ALBUMIN 2.2 (L) 09/27/2022 0341   AST 31 09/27/2022 0341   ALT 38 09/27/2022 0341   ALKPHOS 30 (L) 09/27/2022 0341   BILITOT 1.0 09/27/2022 0341   GFRNONAA 11 (L) 09/27/2022 0341   GFRAA  06/07/2008 0500    >60        The eGFR has been calculated using the MDRD equation. This  calculation has not been validated in all clinical   Lipase     Component Value Date/Time   LIPASE 50 09/21/2022 0810       Studies/Results: No results found.  Anti-infectives: Anti-infectives (From admission, onward)    Start     Dose/Rate Route Frequency Ordered Stop   09/23/22 1530  piperacillin-tazobactam (ZOSYN) IVPB 2.25 g        2.25 g 100 mL/hr over 30 Minutes Intravenous Every 6 hours 09/23/22 1443     09/21/22 1400  piperacillin-tazobactam (ZOSYN) IVPB 3.375 g  Status:  Discontinued        3.375 g 12.5 mL/hr over 240 Minutes Intravenous Every 8 hours 09/21/22 1031 09/23/22 1443   09/21/22 1015  molnupiravir EUA (LAGEVRIO) capsule 800 mg  Status:  Discontinued        4 capsule Oral 2 times daily 09/21/22 1000 09/24/22 0759   09/21/22 0830  piperacillin-tazobactam (ZOSYN) IVPB 3.375 g        3.375 g 100 mL/hr over 30 Minutes Intravenous  Once 09/21/22 4970 09/21/22 0925        Assessment/Plan Acute cholecystitis  POD#4 S/P laparoscopic cholecystectomy  - drain starting to clear up some and amount of output trending down, hgb stable - ok to restart eliquis  from a surgical standpoint  - tolerating diet - mobilize as tolerated - continue abx x5 days postop - pt will discharge home with drain - follow up in AVS   FEN: CM/renal diet with fluid restriction, IVF per TRH/renal  VTE: SCDs, on sqh, ok to resume eliquis ID: Zosyn 12/24>>   - below per TRH -  CAD AAA CKD A. Fib on eliquis Carotid stenosis HTN HLD OSA on CPAP COVID+    LOS: 6 days    Wellington Hampshire, [facility] 09/27/2022, 8:58 AM Please see Amion for pager number during day hours 7:00am-4:30pm

## 2022-09-27 NOTE — Progress Notes (Signed)
[facility] Progress Note Patient: Erik Perez UYQ:[id number] DOB: 06/25/1946 DOA: 09/21/2022  DOS: the patient was seen and examined on 09/27/2022  Brief hospital course:  76 y.o. male with medical history significant of CAD, AAA, CKD, afib on Eliquis (last dose night), carotid stenosis, HTN, HLD, and OSA on CPAP presenting with abdominal pain. Patient reports that he has had mild SOB and fatigue since diagnosed with afib in October.  He became acutely ill with cough, congestion and was diagnosed with COVID on 12/20, started treatment with molnupiravir that day (completing 5 days today).  He also had a known AAA.  He started about 0600 with acute R-sided abdominal pain that radiated to his R shoulder.  The pain later spread across his mid abdomen to both flanks, him to the ER where workup was suggestive of acute cholecystitis, he was seen by general surgery and admitted by [facility].   Assessment and Plan:  Acute cholecystitis Underwent lap chole on 12/26. On IV antibiotics.  Cultures growing Klebsiella. Management per general surgery Resume back on Eliquis.  Postop acute blood loss anemia. H&H relatively stable. Required 1 PRBC transfusion. Sinew to monitor closely now he is back on Eliquis and transfuse as needed  Toxic encephalopathy from opioids. Unintentional narcotic overdose night of 09/22/2022.  Resolved after Narcan.  As needed Narcan on board.  Cautious use of narcotics.     COVID 19 pneumonia present on admission.   Completed molnupiravir. 0-day isolation which ended 09/27/2022.   Paroxysmal A-fib with [place] vasc 2 score of greater than 4. Eliquis on hold for surgery, last dose 09/20/2022,  continue beta-blocker and amiodarone, -Resume Eliquis today as unlikely will need HD access as discussed with renal.Acute on chronic diastolic CHF EF 63% on recent echocardiogram.   Treated with Lasix, continue beta-blocker.   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  , Nephrology consult appreciated. -This is likely due to contrast nephropathy+/-ATN in the setting of acute cholecystitis, COVID and acute blood loss anemia. -No final decision yet about HD, but so far will hold on HD access given good urine output and creatinine is trending down. -Continue with bicarb   HTN  Current regimen includes Cardura and Lopressor. Blood pressure stable.   HLD   Continue atorvastatin   OSA   Continue CPAP nightly   Gout   Continue allopurinol   Infrarenal abdominal aortic aneurysm measures 3.7 cm - incidental, defer to PCP for monitoring closely.  Beta-blocker if he can tolerate.   CTA with an indeterminate exophytic lesion along the inferior pole of L kidney - needs further evaluation as an outpatient with abdominal MRI, defer to PCP.    Subjective: No acute complaint.  No nausea no vomiting no fever no chills.  No further diarrhea.   Physical Exam:  Awake Alert, Oriented X 3, No new F.N deficits, Normal affect Symmetrical Chest wall movement, diminished air entry at the bases RRR,No Gallops,Rubs or new Murmurs, No Parasternal Heave +ve B.Sounds, Abd Soft, No tenderness, right JP drain present No Cyanosis, Clubbing , trace edema, No new Rash or bruise     Data Reviewed: I have Reviewed nursing notes, Vitals, and Lab results. Since last encounter, pertinent lab results CBC and BMP   . I have  ordered test including CBC and BMP  .   Disposition: Status is: Inpatient Remains inpatient appropriate because: Will need HD, also need to have a BM  SCDs Start: 09/21/22 0954 apixaban (ELIQUIS) tablet 5 mg   Family Communication: both sons at bedside Level of care: Med-Surg Continue MedSurg Vitals:   09/26/22 2042 09/27/22 0302 09/27/22 0825 09/27/22 1056   BP: (!) 138/55 (!) 141/62 (!) 143/57   Pulse: 74 78 81   Resp: '17 18 16   '$ Temp: 98.2 F (36.8 C) 98.9 F (37.2 C) 98.2 F (36.8 C)   TempSrc: Oral Oral Oral   SpO2: 96% 94% 94%   Weight:    101.8 kg     Author: Phillips Climes, MD 09/27/2022 1:00 PM  Please look on [email or web address] to find out who is on call.

## 2022-09-27 NOTE — Progress Notes (Signed)
[facility] for eliquis Indication: atrial fibrillation  Allergies  Allergen Reactions   Colchicine Other (See Comments)   Diclofenac Sodium     Chest Pain   Gabapentin     Increased sedation    Niaspan [Niacin Er]     Involuntary facial flushing    Patient Measurements:  Asked nurse to get updated weight 09/27/22, last documented weight was 104.3 kg on 09/12/22  Vital Signs: Temp: 98.2 F (36.8 C) (12/30 0825) Temp Source: Oral (12/30 0825) BP: 143/57 (12/30 0825) Pulse Rate: 81 (12/30 0825)  Labs: Recent Labs    09/25/22 0114 09/25/22 1301 09/26/22 0241 09/27/22 0341  HGB 8.1* 8.5* 8.7* 8.4*  HCT 23.3* 24.0* 24.4* 24.5*  PLT 166  --  186 185  CREATININE 6.22*  --  5.88* 5.06*    CrCl cannot be calculated (Unknown ideal weight.).   Medications:  Scheduled:   acetaminophen  650 mg Oral Q6H   allopurinol  100 mg Oral BID   amiodarone  200 mg Oral Q0600   atorvastatin  40 mg Oral Daily   Chlorhexidine Gluconate Cloth  6 each Topical Q0600   doxazosin  4 mg Oral Daily   heparin injection (subcutaneous)  5,000 Units Subcutaneous Q8H   Ipratropium-Albuterol  1 puff Inhalation QID   metoprolol tartrate  50 mg Oral BID   mupirocin ointment  1 Application Nasal BID   pantoprazole  40 mg Oral Daily   sodium bicarbonate  650 mg Oral TID    Assessment: 76 YOM presenting with abdominal pain found to have acute cholecystitis who underwent lap chole on 12/26. He has a history of Afib and was on Eliquis prior to admission. He has been cleared by general surgery to restart anticoagulation. Hgb low but stable at 8.4 today, platelets WNL at 185k. Not a candidate for dose reduction as weight likely greater than 60kg and age less than 77 years old.   Goal of Therapy:  Monitor platelets by anticoagulation protocol: Yes   Plan:  Restart Eliquis '5mg'$  BID for Afib  Monitor for s/sx of bleeding    Vicenta Dunning, PharmD  PGY1 Pharmacy  Resident

## 2022-09-27 NOTE — Progress Notes (Signed)
Mobility Specialist Progress Note:   09/27/22 1223  Mobility  Activity Ambulated with assistance in room  Level of Assistance Standby assist, set-up cues, supervision of patient - no hands on  Assistive Device Front wheel walker  Distance Ambulated (ft) 200 ft  Activity Response Tolerated well  Mobility Referral Yes  $Mobility charge 1 Mobility   Pt received in chair and agreeable. No complaints. Pt had BM and independent with pericare. Pt left in chair with all needs met, call bell in reach, and sons in room.   Andrey Campanile Mobility Specialist Please contact via SecureChat or  Rehab office at [phone]

## 2022-09-27 NOTE — Progress Notes (Signed)
[facility] Progress Note   Subjective:  no acute events overnight. Son in room as well. They feel as if diarrhea is tapering off. No compromise in urine output reported.  Objective Vitals:   09/26/22 2042 09/27/22 0302 09/27/22 0825 09/27/22 1056  BP: (!) 138/55 (!) 141/62 (!) 143/57   Pulse: 74 78 81   Resp: '17 18 16   '$ Temp: 98.2 F (36.8 C) 98.9 F (37.2 C) 98.2 F (36.8 C)   TempSrc: Oral Oral Oral   SpO2: 96% 94% 94%   Weight:    101.8 kg   Physical Exam Gen: obese, NAD CV: RRR no rub  Abd:  soft s/p lap chole Lungs: clear today, no coughing Extr: trace generalized edema Neuro: AOx3, nonfocal, no asterixis  Additional Objective Labs: Basic Metabolic Panel: Recent Labs  Lab 09/25/22 0114 09/26/22 0241 09/27/22 0341  NA 130* 136 139  K 4.1 4.0 4.1  CL 98 101 107  CO2 19* 17* 20*  GLUCOSE 148* 108* 103*  BUN 75* 75* 69*  CREATININE 6.22* 5.88* 5.06*  CALCIUM 7.7* 8.0* 8.2*   Liver Function Tests: Recent Labs  Lab 09/25/22 0114 09/26/22 0241 09/27/22 0341  AST 55* 47* 31  ALT 48* 48* 38  ALKPHOS 36* 35* 30*  BILITOT 0.8 0.5 1.0  PROT 5.8* 5.9* 5.7*  ALBUMIN 2.1* 2.3* 2.2*   Recent Labs  Lab 09/21/22 0810  LIPASE 50   CBC: Recent Labs  Lab 09/24/22 0329 09/24/22 1548 09/25/22 0114 09/25/22 1301 09/26/22 0241 09/27/22 0341  WBC 8.5 11.3* 10.8*  --  9.1 6.2  NEUTROABS 7.7  --  9.8*  --  7.7 5.0  HGB 7.1* 8.1* 8.1* 8.5* 8.7* 8.4*  HCT 20.1* 23.3* 23.3* 24.0* 24.4* 24.5*  MCV 96.2 94.7 94.7  --  92.8 96.1  PLT 141* 168 166  --  186 185   Blood Culture    Component Value Date/Time   SDES OTHER 09/23/2022 1150   SPECREQUEST  ASCITES 09/23/2022 1150   CULT  09/23/2022 1150    RARE KLEBSIELLA PNEUMONIAE NO ANAEROBES ISOLATED; CULTURE IN PROGRESS FOR 5 DAYS    REPTSTATUS PENDING 09/23/2022 1150    Cardiac Enzymes: No results for input(s): "CKTOTAL", "CKMB", "CKMBINDEX", "TROPONINI" in the last 168 hours. CBG: Recent  Labs  Lab 09/22/22 2215  GLUCAP 115*   Iron Studies: No results for input(s): "IRON", "TIBC", "TRANSFERRIN", "FERRITIN" in the last 72 hours. '@lablastinr3'$ @ Studies/Results: No results found. 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 Cira Servant.  Baseline in the mid to high 2s lately, had acute worsening to 6.  Renal US ok.  Rec'd IV contrast - suspect this is contrast nephropathy +/- ATN in setting of acute cholecystitis, COVID, ABLA.  Currently no indications for RRT, not exhibiting any uremic symptoms and Cr continues to improve (  down to 5.06, nonoliguric per patient). Time will tell where is kidney function settles out to. Continue bladder scans. Follow I/Os, daily labs, avoid nephrotoxins.   **hyperkalemia, mild: resolved, CTM for now   **acute cholecystitis:  s/p lap chole today, on pip/tazo   **COVID infection: mild course s/p antiviral; isolation ends 12/30.  **Acidosis -secondary to AKI and diarrhea -on PO bicarb, improving   **A fib:  on outpt amio, BB.  Eliquis resumed   **HTN: BPs acceptable currently   **OSA on CPAP.   Postop ABLA, anemia -hgb stable in the 8's, transfuse prn  Nothing else to add from a nephrology perspective at this time. Will sign off. If Cr continues to improve especially in the next day or so then would be okay with discharge from my perspective. Discussed with primary service. Will arrange for outpatient follow up at the  office.  Gean Quint, MD [facility]

## 2022-09-28 DIAGNOSIS — I480 Paroxysmal atrial fibrillation: Secondary | ICD-10-CM | POA: Diagnosis not present

## 2022-09-28 DIAGNOSIS — K810 Acute cholecystitis: Secondary | ICD-10-CM | POA: Diagnosis not present

## 2022-09-28 DIAGNOSIS — N184 Chronic kidney disease, stage 4 (severe): Secondary | ICD-10-CM | POA: Diagnosis not present

## 2022-09-28 LAB — BASIC METABOLIC PANEL
Anion gap: 11 (ref 5–15)
BUN: 58 mg/dL — ABNORMAL HIGH (ref 8–23)
CO2: 19 mmol/L — ABNORMAL LOW (ref 22–32)
Calcium: 8 mg/dL — ABNORMAL LOW (ref 8.9–10.3)
Chloride: 105 mmol/L (ref 98–111)
Creatinine, Ser: 4.2 mg/dL — ABNORMAL HIGH (ref 0.61–1.24)
GFR, Estimated: 14 mL/min — ABNORMAL LOW (ref >60)
Glucose, Bld: 110 mg/dL — ABNORMAL HIGH (ref 70–99)
Potassium: 3.6 mmol/L (ref 3.5–5.1)
Sodium: 135 mmol/L (ref 135–145)

## 2022-09-28 LAB — AEROBIC/ANAEROBIC CULTURE W GRAM STAIN (SURGICAL/DEEP WOUND)

## 2022-09-28 LAB — CBC
HCT: 23.3 % — ABNORMAL LOW (ref 39.0–52.0)
Hemoglobin: 8.2 g/dL — ABNORMAL LOW (ref 13.0–17.0)
MCH: 33.3 pg (ref 26.0–34.0)
MCHC: 35.2 g/dL (ref 30.0–36.0)
MCV: 94.7 fL (ref 80.0–100.0)
Platelets: 183 10*3/uL (ref 150–400)
RBC: 2.46 MIL/uL — ABNORMAL LOW (ref 4.22–5.81)
RDW: 14 % (ref 11.5–15.5)
WBC: 7 10*3/uL (ref 4.0–10.5)
nRBC: 0 % (ref 0.0–0.2)

## 2022-09-28 NOTE — Progress Notes (Signed)
[facility] Progress Note Patient: Erik Perez [id number]:[id number] DOB: 04/11/1946 DOA: 09/21/2022  DOS: the patient was seen and examined on 09/28/2022  Brief hospital course:  76 y.o. male with medical history significant of CAD, AAA, CKD, afib on Eliquis (last dose night), carotid stenosis, HTN, HLD, and OSA on CPAP presenting with abdominal pain. Patient reports that he has had mild SOB and fatigue since diagnosed with afib in October.  He became acutely ill with cough, congestion and was diagnosed with COVID on 12/20, started treatment with molnupiravir that day (completing 5 days today).  He also had a known AAA.  He started about 0600 with acute R-sided abdominal pain that radiated to his R shoulder.  The pain later spread across his mid abdomen to both flanks, him to the ER where workup was suggestive of acute cholecystitis, he was seen by general surgery and admitted by [facility].   Assessment and Plan:  Acute cholecystitis Underwent lap chole on 12/26. On IV antibiotics.  Cultures growing Klebsiella.  To finish IV antibiotics this evening Management per general surgery back on Eliquis.  Postop acute blood loss anemia. Required 1 unit PRBC Monitor closely now he is back on Eliquis  Toxic encephalopathy from opioids. Unintentional narcotic overdose night of 09/22/2022.  Resolved after Narcan.  As needed Narcan on board.  Cautious use of narcotics.   Mental status back to baseline   COVID 19 pneumonia present on admission.   Completed molnupiravir.  isolation  ended 09/27/2022.   Paroxysmal A-fib with [place] vasc 2 score of greater than 4. Eliquis on hold for surgery, last dose 09/20/2022,  continue beta-blocker and amiodarone, -Resume Eliquis today as unlikely will need HD access as discussed with renal.Acute on chronic diastolic CHF EF 49% on recent echocardiogram.   Treated with Lasix, continue beta-blocker.   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  , Nephrology consult appreciated. -This is likely due to contrast nephropathy+/-ATN in the setting of acute cholecystitis, COVID and acute blood loss anemia. -No final decision yet about HD, but so far will hold on HD access given good urine output and creatinine is trending down. -Continue with bicarb -Creatinine improving at 4.2 this morning  HTN  Current regimen includes Cardura and Lopressor. Blood pressure stable.   HLD   Continue atorvastatin   OSA   Continue CPAP nightly   Gout   Continue allopurinol   Infrarenal abdominal aortic aneurysm measures 3.7 cm - incidental, defer to PCP for monitoring closely.  Beta-blocker if he can tolerate.   CTA with an indeterminate exophytic lesion along the inferior pole of L kidney - needs further evaluation as an outpatient with abdominal MRI, defer to PCP.    Subjective: No acute complaint.  No nausea no vomiting no fever no chills.  No further diarrhea.   Physical Exam:  Awake Alert, Oriented X 3, No new F.N deficits, Normal affect Symmetrical Chest wall movement, Good air movement bilaterally RRR,No Gallops,Rubs or new Murmurs, No Parasternal Heave +ve B.Sounds, Abd Soft, No tenderness, JP drain present No Cyanosis, Clubbing, +1 edema, No new Rash or bruise     Data Reviewed: I have Reviewed nursing notes, Vitals, and Lab results. Since last encounter, pertinent lab results CBC and BMP   .  I have ordered test including CBC and BMP  .   Disposition: Status is: Inpatient Remains inpatient appropriate because: Will need HD, also need to have a BM  SCDs Start: 09/21/22 0954 apixaban (ELIQUIS) tablet 5 mg   Family Communication: both sons at bedside Level of care: Med-Surg Continue MedSurg Vitals:   09/27/22 2020 09/28/22 0500 09/28/22  0700 09/28/22 0911  BP: (!) 148/58 (!) 144/57 (!) 158/61   Pulse: 75 79 78 76  Resp: '16 18 18 16  '$ Temp: 98.5 F (36.9 C) 98.8 F (37.1 C) 98.6 F (37 C)   TempSrc: Oral Oral Oral   SpO2: 95% 95% 91%   Weight:         Author: Phillips Climes, MD 09/28/2022 12:20 PM  Please look on [email or web address] to find out who is on call.

## 2022-09-28 NOTE — Progress Notes (Signed)
Mobility Specialist Progress Note:   09/28/22 1157  Mobility  Activity Ambulated with assistance in hallway  Level of Assistance Standby assist, set-up cues, supervision of patient - no hands on  Assistive Device Front wheel walker  Distance Ambulated (ft) 500 ft  Activity Response Tolerated well  Mobility Referral Yes  $Mobility charge 1 Mobility   Pt received in bathroom and agreeable. No complaints. Pt left in bed with all needs met and call bell in reach.   Andrey Campanile Mobility Specialist Please contact via SecureChat or  Rehab office at [phone]

## 2022-09-28 NOTE — Progress Notes (Signed)
Pt refused to wear CPAP tonight. ?

## 2022-09-29 DIAGNOSIS — K810 Acute cholecystitis: Secondary | ICD-10-CM | POA: Diagnosis not present

## 2022-09-29 DIAGNOSIS — N184 Chronic kidney disease, stage 4 (severe): Secondary | ICD-10-CM | POA: Diagnosis not present

## 2022-09-29 LAB — BASIC METABOLIC PANEL
Anion gap: 14 (ref 5–15)
BUN: 48 mg/dL — ABNORMAL HIGH (ref 8–23)
CO2: 21 mmol/L — ABNORMAL LOW (ref 22–32)
Calcium: 8.4 mg/dL — ABNORMAL LOW (ref 8.9–10.3)
Chloride: 104 mmol/L (ref 98–111)
Creatinine, Ser: 3.52 mg/dL — ABNORMAL HIGH (ref 0.61–1.24)
GFR, Estimated: 17 mL/min — ABNORMAL LOW (ref >60)
Glucose, Bld: 108 mg/dL — ABNORMAL HIGH (ref 70–99)
Potassium: 3.6 mmol/L (ref 3.5–5.1)
Sodium: 139 mmol/L (ref 135–145)

## 2022-09-29 LAB — CBC
HCT: 25 % — ABNORMAL LOW (ref 39.0–52.0)
Hemoglobin: 8.5 g/dL — ABNORMAL LOW (ref 13.0–17.0)
MCH: 32.8 pg (ref 26.0–34.0)
MCHC: 34 g/dL (ref 30.0–36.0)
MCV: 96.5 fL (ref 80.0–100.0)
Platelets: 195 10*3/uL (ref 150–400)
RBC: 2.59 MIL/uL — ABNORMAL LOW (ref 4.22–5.81)
RDW: 14.1 % (ref 11.5–15.5)
WBC: 6.5 10*3/uL (ref 4.0–10.5)
nRBC: 0 % (ref 0.0–0.2)

## 2022-09-29 MED ORDER — AMLODIPINE BESYLATE 5 MG PO TABS: 5.0000 mg | ORAL_TABLET | Freq: Two times a day (BID) | ORAL | Status: DC | PRN

## 2022-09-29 MED ORDER — PANTOPRAZOLE SODIUM 40 MG PO TBEC: 40.0000 mg | DELAYED_RELEASE_TABLET | Freq: Every day | ORAL | 0 refills | Status: DC

## 2022-09-29 MED ORDER — SODIUM BICARBONATE 650 MG PO TABS: 650.0000 mg | ORAL_TABLET | Freq: Two times a day (BID) | ORAL | 0 refills | Status: DC

## 2022-09-29 MED ORDER — FUROSEMIDE 20 MG PO TABS: ORAL_TABLET | ORAL | 0 refills | Status: AC

## 2022-09-29 MED ORDER — FUROSEMIDE 20 MG PO TABS
20.0000 mg | ORAL_TABLET | Freq: Every day | ORAL | Status: DC
  Administered 2022-09-29: 20 mg via ORAL
  Filled 2022-09-29: qty 1

## 2022-09-29 MED ORDER — ACETAMINOPHEN 325 MG PO TABS: 650.0000 mg | ORAL_TABLET | Freq: Four times a day (QID) | ORAL | Status: DC | PRN

## 2022-09-29 NOTE — Discharge Summary (Addendum)
Physician Discharge Summary  Erik Perez YHC:[id number] DOB: Apr 07, 1946 DOA: 09/21/2022  PCP: Lurline Del, DO  Admit date: 09/21/2022 Discharge date: 09/29/2022  Admitted From: (Home) Disposition:  (Home )  Recommendations for Outpatient Follow-up:  Follow up with PCP in 1 weeks Please obtain BMP/CBC in one week Patient will need outpatient follow-up for incidental finding of infrarenal abdominal aortic aneurysm Patient will need follow-up for incidental finding of indeterminate exophytic lesions along the inferior pole of left kidney, likely will need outpatient abdominal MRI   Home Health: (YES)  Discharge Condition: (Stable) CODE STATUS: (FULL) Diet recommendation: Heart Healthy  Brief/Interim Summary:  77 y.o. male with medical history significant of CAD, AAA, CKD, afib on Eliquis (last dose night), carotid stenosis, HTN, HLD, and OSA on CPAP presenting with abdominal pain. Patient reports that he has had mild SOB and fatigue since diagnosed with afib in October.  He became acutely ill with cough, congestion and was diagnosed with COVID on 12/20, started treatment with molnupiravir that day (completing 5 days today).  He also had a known AAA.  He started about 0600 with acute R-sided abdominal pain that radiated to his R shoulder.  The pain later spread across his mid abdomen to both flanks, him to the ER where workup was suggestive of acute cholecystitis, he was seen by general surgery and admitted by [facility], please see discussion below  Acute cholecystitis Underwent lap chole on 12/26.  Cultures growing Klebsiella, treated with IV Zosyn, no further antibiotics needed at time of discharge, he will be discharged with JP drain, follow-up scheduled with general surgery in 10/02/2022.  Postop acute blood loss anemia. Required 1 unit PRBC, he has been back on Eliquis for last 2 days with stable hemoglobin, it is 8.5 on discharge.    Toxic encephalopathy from  opioids. Unintentional narcotic overdose night of 09/22/2022.  Resolved after Narcan. Mental status back to baseline   COVID 19 pneumonia present on admission.   Completed molnupiravir.  isolation  ended 09/27/2022.   Paroxysmal A-fib with [place] vasc 2 score of greater than 4. Eliquis on hold for surgery, last dose 09/20/2022,  continue beta-blocker and amiodarone. 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 -Continue with sodium bicarb on discharge  HTN  -Patient has been adjusted, he will be discharged on home dose Cardura and Lopressor, will change his Norvasc to as needed.  HLD   Continue atorvastatin   OSA   Continue CPAP nightly   Gout   Continue allopurinol   Infrarenal abdominal aortic aneurysm measures 3.7 cm - incidental, defer to PCP for monitoring closely.  Beta-blocker if he can tolerate.  Discussed with son.   CTA with an indeterminate exophytic lesion along the inferior pole of L kidney - needs further evaluation as an outpatient with abdominal MRI, defer to PCP.  Discussed with son.     Discharge Diagnoses:  Principal Problem:   Acute cholecystitis Active Problems:   Essential hypertension, benign   Mixed hyperlipidemia   Paroxysmal atrial fibrillation (HCC)   Coronary artery disease of bypass graft of native heart with stable angina pectoris ([place])   COVID-19  virus infection   CKD (chronic kidney disease) stage 4, GFR 15-29 ml/min (HCC)   AAA (abdominal aortic aneurysm) without rupture (HCC)   OSA on CPAP    Discharge Instructions  Discharge Instructions     Diet - low sodium  heart healthy   Complete by: As directed    Increase activity slowly   Complete by: As directed       Allergies as of 09/29/2022       Reactions   Colchicine Other (See Comments)   Diclofenac Sodium    Chest Pain   Gabapentin    Increased sedation   Niaspan [niacin Er]    Involuntary facial flushing        Medication List     STOP taking these medications    hydrochlorothiazide 25 MG tablet Commonly known as: HYDRODIURIL   Lagevrio 200 MG Caps capsule Generic drug: molnupiravir EUA   lisinopril 40 MG tablet Commonly known as: ZESTRIL   SILDENAFIL CITRATE PO       TAKE these medications    acetaminophen 325 MG tablet Commonly known as: TYLENOL Take 2 tablets (650 mg total) by mouth every 6 (six) hours as needed for mild pain (or temp > 100).   allopurinol 100 MG tablet Commonly known as: ZYLOPRIM Take 100 mg by mouth 2 (two) times daily.   amiodarone 200 MG tablet Commonly known as: PACERONE Take 200 mg by mouth 2 (two) times daily. Through Dec. 15th and then goes to once daily   amLODipine 5 MG tablet Commonly known as: NORVASC Take 1 tablet (5 mg total) by mouth 2 (two) times daily as needed (Please take if systolic blood pressure is persistently> 160). What changed:  when to take this reasons to take this   apixaban 5 MG Tabs tablet Commonly known as: ELIQUIS Take 1 tablet (5 mg total) by mouth 2 (two) times daily.   atorvastatin 40 MG tablet Commonly known as: LIPITOR Take 40 mg by mouth daily.   B-12 IJ Take one (1) injection into the muscle once a month.   calcium gluconate 500 MG tablet Take 1 tablet by mouth 3 (three) times daily.   diltiazem 30 MG tablet Commonly known as: Cardizem Take 1 tablet (30 mg total) by mouth 2 (two) times daily as needed (symptomatic atrial fibrillation lasting longer than 5 minutes).   doxazosin 4 MG tablet Commonly known as: CARDURA Take 4 mg by mouth daily.   fexofenadine 180 MG tablet Commonly  known as: ALLEGRA Take 180 mg by mouth daily.   Fish Oil 1200 MG Caps Take 1,200 mg by mouth 2 (two) times daily.   furosemide 20 MG tablet Commonly known as: LASIX Take 20 mg oral daily for next 5 days, then as needed for edema or fluid retention What changed:  how much to take how to take this when to take this reasons to take this additional instructions   Metoprolol Tartrate 75 MG Tabs Take 75 mg by mouth 2 (two) times daily.   multivitamin tablet Take 1 tablet by mouth daily.   nitroGLYCERIN 0.4 MG SL tablet Commonly known as: NITROSTAT Place 1 tablet (0.4 mg total) under the tongue every 5 (five) minutes as needed for chest pain.   pantoprazole 40 MG tablet Commonly known as: PROTONIX Take 1 tablet (40 mg total) by mouth daily. Start taking on: September 30, 2022   sodium bicarbonate 650 MG tablet Take 1 tablet (650 mg total) by mouth 2 (two) times daily.  Vitamin D 50 MCG (2000 UT) tablet Take 2,000 Units by mouth 2 (two) times daily.        Follow-up Information     Maczis, Carlena Hurl, [place]. Go on 10/16/2022.   Specialty: General Surgery Why: 1:45 PM. Please arrive 30 min prior to appointment time to check in. Have ID and insurance information. Contact information: [place] [address] [phone]         Surgery, [place]. Go on 10/02/2022.   Specialty: General Surgery Why: 10 AM for RN visit for drain removal. Please arrive 15 min prior to appointment time. Contact information: [place] [address] [phone]         Care, [facility] Follow up.   Specialty: Home Health Services Why: [facility] will be providing home health PT. OT and RN.  They will call you in the next 24- 48 hours after your discharge to home to set up home health services. Contact information: [address] [place] [address] [phone]         Gean Quint, MD Follow up in 2 week(s).   Specialty: Nephrology Contact information: [address] [place] [address] [phone]                Allergies  Allergen Reactions   Colchicine Other (See Comments)   Diclofenac Sodium     Chest Pain   Gabapentin     Increased sedation    Niaspan [Niacin Er]     Involuntary facial flushing    Consultations: General surgery renal   Procedures/Studies: Cardiac event monitor  Result Date: 09/29/2022   Basic underlying rhythm is NSR   < 1% AF burden over duration of study. Two episodes occurred.   The AF episode with the longest duration was 7 hours on 08/20/2022. The second episode lasted < 21 minutes   PVC and PAC burden < 1% Recommend continue anticoagulation to decrease stroke risk.   US RENAL  Result Date: 09/23/2022 CLINICAL DATA:  Acute kidney injury EXAM: RENAL / URINARY TRACT ULTRASOUND COMPLETE COMPARISON:  09/21/2022 FINDINGS: Right Kidney: Renal measurements: 10.4 x 5.8 x 6.5 cm = volume: 202.6. mL. Echogenicity within normal limits. Mild renal cortical thinning. No solid mass or hydronephrosis visualized. Right interpolar renal cyst measuring 3.5 cm. 5.6 cm right inferior pole renal cyst. Left Kidney: Renal measurements: 10.6 x 5 x 5.3 cm = volume: 146.5 mL. Echogenicity within normal limits. Mild renal cortical thinning. No solid mass or hydronephrosis visualized. 2 cm left inferior pole renal cyst. Bladder: Appears normal for degree of bladder distention. Other: None. IMPRESSION: 1. No obstructive uropathy. 2. Mild bilateral renal cortical thinning as can be seen with medical renal disease. 3. Bilateral renal cysts. Electronically Signed   By: Kathreen Devoid M.D.   On: 09/23/2022 11:35   DG Chest Port 1 View  Result Date: 09/22/2022 CLINICAL DATA:  Shortness of breath EXAM: PORTABLE CHEST 1 VIEW COMPARISON:  Radiograph 09/11/2022, CT 09/21/2022 FINDINGS: Unchanged cardiomediastinal silhouette with median sternotomy and  postsurgical changes of CABG. There are right medial basilar airspace opacities. No evidence of pneumothorax or large effusion. No acute osseous abnormality. Cervical spine fusion hardware noted. IMPRESSION: Right medial basilar airspace opacities, could be atelectasis or infection. Recommend continued follow-up. Electronically Signed   By: Maurine Simmering M.D.   On: 09/22/2022 09:45   CT Angio Chest/Abd/Pel for Dissection W and/or Wo Contrast  Result Date: 09/21/2022 CLINICAL DATA:  Rule out acute aortic syndrome. Severe abdominal pain EXAM: CT ANGIOGRAPHY CHEST, ABDOMEN AND PELVIS TECHNIQUE: Non-contrast CT of the chest was initially obtained. Multidetector CT imaging through the chest, abdomen and pelvis was performed using the standard protocol during bolus administration of intravenous contrast. Multiplanar reconstructed images and MIPs were obtained and reviewed to evaluate the vascular anatomy. RADIATION DOSE REDUCTION: This exam was performed according to the departmental dose-optimization program which includes automated exposure control, adjustment of the mA and/or kV according to patient size and/or use of iterative reconstruction technique. CONTRAST:  67m OMNIPAQUE IOHEXOL 350 MG/ML SOLN COMPARISON:  Abdominal aortic ultrasound 07/28/2018 FINDINGS: CTA CHEST FINDINGS Cardiovascular: Preferential opacification of the thoracic aorta. No evidence of thoracic aortic aneurysm or dissection. Normal heart size. No pericardial effusion. Aortic atherosclerosis. Previous CABG procedure. Mediastinum/Nodes: Thyroid gland, trachea appear normal. There is mild diffuse circumferential wall thickening of the esophagus which may reflect underlying esophagitis. No enlarged axillary, supraclavicular, mediastinal, or hilar lymph nodes. Lungs/Pleura: Trace pleural fluid identified bilaterally. Mild atelectasis noted in the posterior right base. No suspicious pulmonary nodule or mass. Musculoskeletal: No acute abnormality.   Previous median sternotomy. Review of the MIP images confirms the above findings. CTA ABDOMEN AND PELVIS FINDINGS VASCULAR Aorta: Infrarenal abdominal aortic aneurysm measures 3.7 cm, image 199/6. Aortic atherosclerotic calcifications. No signs of dissection. No significant stenosis. Celiac: Patent. 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 Renals: Both renal arteries are patent without evidence of aneurysm, dissection, vasculitis, fibromuscular dysplasia or significant stenosis. IMA: Patent without evidence of aneurysm, dissection, vasculitis or significant stenosis. Inflow: Patent without evidence of aneurysm, dissection, vasculitis or significant stenosis. Veins: No obvious venous abnormality within the limitations of this arterial phase study. Review of the MIP images confirms the above findings. NON-VASCULAR Hepatobiliary: Liver cysts noted.  No suspicious liver lesion. There are multiple stones within the gallbladder which measure up to 1.5 cm. There is diffuse gallbladder wall thickening and pericholecystic inflammation. Pericholecystic fluid is identified and there is fluid extending along the right pericolic gutter. Fluid measures scratch set the fluid is mildly complex measuring 19 Hounsfield units, image 275/6. No signs of bile duct dilatation. Pancreas: Unremarkable. No pancreatic ductal dilatation or surrounding inflammatory changes. Spleen: Normal in size without focal abnormality. Adrenals/Urinary Tract: There is a nodule in the right adrenal gland measuring 2 cm and 1.5 Hounsfield units. This is compatible with a benign adenoma. No follow-up imaging recommended. Bilateral kidney cysts are identified. Arising off the inferior pole of the left kidney is an exophytic lesion measuring 1.7 cm and 85 Hounsfield units. This does not  meet CT criteria for benign kidney lesion. No signs of nephrolithiasis or hydronephrosis. Urinary bladder is unremarkable. Stomach/Bowel: Stomach is normal. There is wall thickening involving the descending portion of the duodenum adjacent to the gallbladder. The appendix is visualized and appears normal. No pathologic dilatation of the large or small bowel loops. Lymphatic: No enlarged abdominopelvic adenopathy. Reproductive: Moderate prostate gland enlargement. Other: No discrete fluid collection identified. No signs of pneumoperitoneum. Musculoskeletal: No acute or significant osseous findings. Review of the MIP images confirms the above findings. IMPRESSION: 1. No evidence for thoracic or abdominal aortic dissection. 2. Gallstones with diffuse gallbladder wall thickening and pericholecystic inflammation. Findings are concerning for acute cholecystitis. There is small volume of free fluid which appears mildly complex measuring 19  Hounsfield units extending from the gallbladder along the right pericolic gutter and along bilateral peritoneal reflections within the lower abdomen. 3. Wall thickening involving the descending portion of the duodenum adjacent to the gallbladder is likely reactive in the setting of acute cholecystitis. 4. Infrarenal abdominal aortic aneurysm measures 3.7 cm. Recommend follow-up ultrasound every 2 years. This recommendation follows ACR consensus guidelines: White Paper of the ACR Incidental Findings Committee II on Vascular Findings. J Am Coll Radiol 2013; 10:789-794. 5. Trace pleural fluid bilaterally. 6. Indeterminate exophytic lesion arising off the inferior pole of the left kidney. This may represent a hemorrhagic cyst or enhancing kidney lesion. This does not require emergent attention. When the patient is clinically stable and able to follow directions and hold their breath (preferably as an outpatient) further evaluation with dedicated abdominal MRI should be considered. 7.  Aortic  Atherosclerosis (ICD10-I70.0). Electronically Signed   By: Kerby Moors M.D.   On: 09/21/2022 08:15   DG Chest 2 View  Result Date: 09/11/2022 CLINICAL DATA:  Shortness of breath and atrial fibrillation EXAM: CHEST - 2 VIEW COMPARISON:  Chest radiograph dated 08/20/2022 FINDINGS: Normal lung volumes. No focal consolidations. No pleural effusion or pneumothorax. Similar postsurgical cardiomediastinal silhouette. Median sternotomy wires are nondisplaced. Cervical fixation hardware appears intact. IMPRESSION: No active cardiopulmonary disease. Electronically Signed   By: Darrin Nipper M.D.   On: 09/11/2022 12:19   MYOCARDIAL PERFUSION IMAGING  Result Date: 09/05/2022   Findings are consistent with prior myocardial infarction. Findings are equivocal. The study is low risk.   No ST deviation was noted.   LV perfusion is abnormal. There is no evidence of ischemia. There is evidence of infarction. Defect 1: There is a medium defect with moderate reduction in uptake present in the apical to mid anterior location(s) that is fixed. There is abnormal wall motion in the defect area. Consistent with infarction.   Left ventricular function is abnormal. There was a single regional abnormality. Nuclear stress EF: 57 %. The left ventricular ejection fraction is normal (55-65%). End diastolic cavity size is normal. End systolic cavity size is normal.   Prior study not available for comparison.      Subjective:  No significant events overnight, he denies any complaints. Discharge Exam: Vitals:   09/29/22 0520 09/29/22 0842  BP: (!) 160/56 (!) 180/66  Pulse: 76 78  Resp: 14 16  Temp: 98.2 F (36.8 C) 98.3 F (36.8 C)  SpO2: 93% 96%   Vitals:   09/28/22 1953 09/28/22 2026 09/29/22 0520 09/29/22 0842  BP: (!) 148/55  (!) 160/56 (!) 180/66  Pulse: 72  76 78  Resp: '16  14 16  '$ Temp: 97.8 F (36.6 C)  98.2 F (36.8 C) 98.3 F (36.8 C)  TempSrc: Oral  Oral Oral  SpO2: 92% 92% 93% 96%  Weight:         General: Pt is alert, awake, not in acute distress Cardiovascular: RRR, S1/S2 +, no rubs, no gallops Respiratory: CTA bilaterally, no wheezing, no rhonchi Abdominal: Soft, NT, ND, bowel sounds +, JP drain present Extremities: +1 edema, no cyanosis    The results of significant diagnostics from this hospitalization (including imaging, microbiology, ancillary and laboratory) are listed below for reference.     Microbiology: Recent Results (from the past 240 hour(s))  MRSA Next Gen by PCR, Nasal     Status: Abnormal   Collection Time: 09/22/22  6:00 AM   Specimen: Nasal Mucosa; Nasal Swab  Result Value Ref Range Status  MRSA by PCR Next Gen DETECTED (A) NOT DETECTED Final    Comment: RVB NOTIFIED ADELIDE BUTLER ON 12.25.23 AT 0944 BY EM (NOTE) The GeneXpert MRSA Assay (FDA approved for NASAL specimens only), is one component of a comprehensive MRSA colonization surveillance program. It is not intended to diagnose MRSA infection nor to guide or monitor treatment for MRSA infections. Test performance is not FDA approved in patients less than 86 years old. Performed at [facility], [place] [address], [place], [place] [address]   Aerobic/Anaerobic Culture w Gram Stain (surgical/deep wound)     Status: None   Collection Time: 09/23/22 11:50 AM   Specimen: PATH Cytology Peritoneal fluid; Body Fluid  Result Value Ref Range Status   Specimen Description OTHER  Final   Special Requests  ASCITES  Final   Gram Stain   Final    FEW WBC PRESENT, PREDOMINANTLY PMN NO ORGANISMS SEEN    Culture   Final    RARE KLEBSIELLA PNEUMONIAE NO ANAEROBES ISOLATED Performed at [facility], [address], [place], [place] [address]    Report Status 09/28/2022 FINAL  Final   Organism ID, Bacteria KLEBSIELLA PNEUMONIAE  Final      Susceptibility   Klebsiella pneumoniae - MIC*    AMPICILLIN >=32 RESISTANT Resistant     CEFAZOLIN <=4 SENSITIVE Sensitive     CEFEPIME <=0.12 SENSITIVE  Sensitive     CEFTAZIDIME <=1 SENSITIVE Sensitive     CEFTRIAXONE 0.5 SENSITIVE Sensitive     CIPROFLOXACIN 0.5 INTERMEDIATE Intermediate     GENTAMICIN <=1 SENSITIVE Sensitive     IMIPENEM 0.5 SENSITIVE Sensitive     TRIMETH/SULFA <=20 SENSITIVE Sensitive     AMPICILLIN/SULBACTAM 16 INTERMEDIATE Intermediate     PIP/TAZO 16 SENSITIVE Sensitive     * RARE KLEBSIELLA PNEUMONIAE     Labs: BNP (last 3 results) Recent Labs    09/22/22 0552 09/23/22 0338 09/24/22 0329  BNP 293.1* 370.4* 810.1*   Basic Metabolic Panel: Recent Labs  Lab 09/23/22 0338 09/24/22 0329 09/25/22 0114 09/26/22 0241 09/27/22 0341 09/28/22 0202 09/29/22 0240  NA 130* 130* 130* 136 139 135 139  K 4.6 5.3* 4.1 4.0 4.1 3.6 3.6  CL 97* 99 98 101 107 105 104  CO2 19* 18* 19* 17* 20* 19* 21*  GLUCOSE 90 177* 148* 108* 103* 110* 108*  BUN 49* 66* 75* 75* 69* 58* 48*  CREATININE 5.00* 6.06* 6.22* 5.88* 5.06* 4.20* 3.52*  CALCIUM 7.9* 8.0* 7.7* 8.0* 8.2* 8.0* 8.4*  MG 1.8 2.0 2.1 2.2  --   --   --    Liver Function Tests: Recent Labs  Lab 09/23/22 0338 09/24/22 0329 09/25/22 0114 09/26/22 0241 09/27/22 0341  AST 19 65* 55* 47* 31  ALT 18 44 48* 48* 38  ALKPHOS 40 31* 36* 35* 30*  BILITOT 1.0 0.9 0.8 0.5 1.0  PROT 5.9* 5.7* 5.8* 5.9* 5.7*  ALBUMIN 2.3* 2.2* 2.1* 2.3* 2.2*   No results for input(s): "LIPASE", "AMYLASE" in the last 168 hours. No results for input(s): "AMMONIA" in the last 168 hours. CBC: Recent Labs  Lab 09/23/22 0338 09/24/22 0329 09/24/22 1548 09/25/22 0114 09/25/22 1301 09/26/22 0241 09/27/22 0341 09/28/22 0202 09/29/22 0240  WBC 8.6 8.5   < > 10.8*  --  9.1 6.2 7.0 6.5  NEUTROABS 7.3 7.7  --  9.8*  --  7.7 5.0  --   --   HGB 8.7* 7.1*   < > 8.1* 8.5*  8.7* 8.4* 8.2* 8.5*  HCT 25.0* 20.1*   < > 23.3* 24.0* 24.4* 24.5* 23.3* 25.0*  MCV 97.3 96.2   < > 94.7  --  92.8 96.1 94.7 96.5  PLT 150 141*   < > 166  --  186 185 183 195   < > = values in this interval not  displayed.   Cardiac Enzymes: No results for input(s): "CKTOTAL", "CKMB", "CKMBINDEX", "TROPONINI" in the last 168 hours. BNP: Invalid input(s): "POCBNP" CBG: Recent Labs  Lab 09/22/22 2215  GLUCAP 115*   D-Dimer No results for input(s): "DDIMER" in the last 72 hours. Hgb A1c No results for input(s): "HGBA1C" in the last 72 hours. Lipid Profile No results for input(s): "CHOL", "HDL", "LDLCALC", "TRIG", "CHOLHDL", "LDLDIRECT" in the last 72 hours. Thyroid function studies No results for input(s): "TSH", "T4TOTAL", "T3FREE", "THYROIDAB" in the last 72 hours.  Invalid input(s): "FREET3" Anemia work up No results for input(s): "VITAMINB12", "FOLATE", "FERRITIN", "TIBC", "IRON", "RETICCTPCT" in the last 72 hours. Urinalysis    Component Value Date/Time   COLORURINE YELLOW 06/02/2008 1135   APPEARANCEUR CLEAR 06/02/2008 1135   LABSPEC 1.005 06/02/2008 1135   PHURINE 6.0 06/02/2008 1135   GLUCOSEU NEGATIVE 06/02/2008 1135   HGBUR NEGATIVE 06/02/2008 1135   BILIRUBINUR NEGATIVE 06/02/2008 1135   KETONESUR NEGATIVE 06/02/2008 1135   PROTEINUR NEGATIVE 06/02/2008 1135   UROBILINOGEN 0.2 06/02/2008 1135   NITRITE NEGATIVE 06/02/2008 1135   LEUKOCYTESUR  06/02/2008 1135    NEGATIVE MICROSCOPIC NOT DONE ON URINES WITH NEGATIVE PROTEIN, BLOOD, LEUKOCYTES, NITRITE, OR GLUCOSE <1000 mg/dL.   Sepsis Labs Recent Labs  Lab 09/26/22 0241 09/27/22 0341 09/28/22 0202 09/29/22 0240  WBC 9.1 6.2 7.0 6.5   Microbiology Recent Results (from the past 240 hour(s))  MRSA Next Gen by PCR, Nasal     Status: Abnormal   Collection Time: 09/22/22  6:00 AM   Specimen: Nasal Mucosa; Nasal Swab  Result Value Ref Range Status   MRSA by PCR Next Gen DETECTED (A) NOT DETECTED Final    Comment: RVB NOTIFIED ADELIDE BUTLER ON 12.25.23 AT 0944 BY EM (NOTE) The GeneXpert MRSA Assay (FDA approved for NASAL specimens only), is one component of a comprehensive MRSA colonization surveillance program.  It is not intended to diagnose MRSA infection nor to guide or monitor treatment for MRSA infections. Test performance is not FDA approved in patients less than 25 years old. Performed at [facility], [place] [address] [place], [place] [address]   Aerobic/Anaerobic Culture w Gram Stain (surgical/deep wound)     Status: None   Collection Time: 09/23/22 11:50 AM   Specimen: PATH Cytology Peritoneal fluid; Body Fluid  Result Value Ref Range Status   Specimen Description OTHER  Final   Special Requests  ASCITES  Final   Gram Stain   Final    FEW WBC PRESENT, PREDOMINANTLY PMN NO ORGANISMS SEEN    Culture   Final    RARE KLEBSIELLA PNEUMONIAE NO ANAEROBES ISOLATED Performed at [facility], [address], [place], [place] [address]    Report Status 09/28/2022 FINAL  Final   Organism ID, Bacteria KLEBSIELLA PNEUMONIAE  Final      Susceptibility   Klebsiella pneumoniae - MIC*    AMPICILLIN >=32 RESISTANT Resistant     CEFAZOLIN <=4 SENSITIVE Sensitive     CEFEPIME <=0.12 SENSITIVE Sensitive     CEFTAZIDIME <=1 SENSITIVE Sensitive     CEFTRIAXONE 0.5 SENSITIVE Sensitive  CIPROFLOXACIN 0.5 INTERMEDIATE Intermediate     GENTAMICIN <=1 SENSITIVE Sensitive     IMIPENEM 0.5 SENSITIVE Sensitive     TRIMETH/SULFA <=20 SENSITIVE Sensitive     AMPICILLIN/SULBACTAM 16 INTERMEDIATE Intermediate     PIP/TAZO 16 SENSITIVE Sensitive     * RARE KLEBSIELLA PNEUMONIAE     Time coordinating discharge: Over 30 minutes  SIGNED:   Phillips Climes, MD  [facility] 09/29/2022, 1:49 PM Pager   If 7PM-7AM, please contact night-coverage [email or web address]

## 2022-09-29 NOTE — Progress Notes (Signed)
Patient given written and verbal discharge instructions-verbalized understanding. Peripheral IV removed x2 without complications. Patient and patient's sons educated on how and when to change dressing around JP drain-demonstrated understanding. Patient demonstrated understanding on how to empty JP drain. Patient voices no questions or concerns at this time.

## 2022-09-29 NOTE — Progress Notes (Signed)
[facility] Progress Note  6 Days Post-Op  Subjective: CC-  Patient resting comfortably. Family at bedside. States that he is likely going home today. Tolerating diet, having bowel function. Only taking tylenol for pain. Hgb 8.5 from 8.2. drain with only 20cc output.  Objective: Vital signs in last 24 hours: Temp:  [97.8 F (36.6 C)-98.3 F (36.8 C)] 98.3 F (36.8 C) (01/01 0842) Pulse Rate:  [72-79] 78 (01/01 0842) Resp:  [14-18] 16 (01/01 0842) BP: (146-180)/(54-66) 180/66 (01/01 0842) SpO2:  [91 %-96 %] 96 % (01/01 0842) Last BM Date : 09/28/22  Intake/Output from previous day: 12/31 0701 - 01/01 0700 In: -  Out: 20 [Drains:20] Intake/Output this shift: No intake/output data recorded.  PE: General: sleeping, NAD Lungs: Respiratory effort nonlabored Abd: soft, NT, protuberant, drain with clot in bulb (no fluid, it was just emptied), incisions C/D/I  Lab Results:  Recent Labs    09/28/22 0202 09/29/22 0240  WBC 7.0 6.5  HGB 8.2* 8.5*  HCT 23.3* 25.0*  PLT 183 195   BMET Recent Labs    09/28/22 0202 09/29/22 0240  NA 135 139  K 3.6 3.6  CL 105 104  CO2 19* 21*  GLUCOSE 110* 108*  BUN 58* 48*  CREATININE 4.20* 3.52*  CALCIUM 8.0* 8.4*   PT/INR No results for input(s): "LABPROT", "INR" in the last 72 hours. CMP     Component Value Date/Time   NA 139 09/29/2022 0240   NA 143 09/04/2022 1043   K 3.6 09/29/2022 0240   CL 104 09/29/2022 0240   CO2 21 (L) 09/29/2022 0240   GLUCOSE 108 (H) 09/29/2022 0240   BUN 48 (H) 09/29/2022 0240   BUN 37 (H) 09/04/2022 1043   CREATININE 3.52 (H) 09/29/2022 0240   CALCIUM 8.4 (L) 09/29/2022 0240   PROT 5.7 (L) 09/27/2022 0341   ALBUMIN 2.2 (L) 09/27/2022 0341   AST 31 09/27/2022 0341   ALT 38 09/27/2022 0341   ALKPHOS 30 (L) 09/27/2022 0341   BILITOT 1.0 09/27/2022 0341   GFRNONAA 17 (L) 09/29/2022 0240   GFRAA  06/07/2008 0500    >60        The eGFR has been calculated using the MDRD  equation. This calculation has not been validated in all clinical   Lipase     Component Value Date/Time   LIPASE 50 09/21/2022 0810       Studies/Results: No results found.  Anti-infectives: Anti-infectives (From admission, onward)    Start     Dose/Rate Route Frequency Ordered Stop   09/27/22 2200  piperacillin-tazobactam (ZOSYN) IVPB 2.25 g        2.25 g 100 mL/hr over 30 Minutes Intravenous Every 8 hours 09/27/22 1429 09/28/22 2314   09/23/22 1530  piperacillin-tazobactam (ZOSYN) IVPB 2.25 g  Status:  Discontinued        2.25 g 100 mL/hr over 30 Minutes Intravenous Every 6 hours 09/23/22 1443 09/27/22 1429   09/21/22 1400  piperacillin-tazobactam (ZOSYN) IVPB 3.375 g  Status:  Discontinued        3.375 g 12.5 mL/hr over 240 Minutes Intravenous Every 8 hours 09/21/22 1031 09/23/22 1443   09/21/22 1015  molnupiravir EUA (LAGEVRIO) capsule 800 mg  Status:  Discontinued        4 capsule Oral 2 times daily 09/21/22 1000 09/24/22 0759   09/21/22 0830  piperacillin-tazobactam (ZOSYN) IVPB 3.375 g        3.375 g 100 mL/hr over 30 Minutes Intravenous  Once 09/21/22 3557 09/21/22 0925        Assessment/Plan Acute cholecystitis  POD#5 S/P laparoscopic cholecystectomy  - Hgb stable after restarting eliquis. Drain output less bloody and decreased in volume - continue abx x5 days postop - ok for discharge today from surgical standpoint. Discharge instructions and follow up info on AVS. He is not taking any narcotic    FEN: CM/renal diet with fluid restriction, IVF per TRH/renal  VTE: SCDs, eliquis ID: Zosyn 12/24>>   - below per TRH -  CAD AAA CKD A. Fib on eliquis Carotid stenosis HTN HLD OSA on CPAP COVID+    LOS: 8 days    Wellington Hampshire, [facility] 09/29/2022, 9:09 AM Please see Amion for pager number during day hours 7:00am-4:30pm

## 2022-09-29 NOTE — Progress Notes (Signed)
Mobility Specialist: Progress Note   09/29/22 1133  Mobility  Activity Ambulated with assistance in hallway  Level of Assistance Contact guard assist, steadying assist  Assistive Device Front wheel walker  Distance Ambulated (ft) 250 ft  Activity Response Tolerated well  Mobility Referral Yes  $Mobility charge 1 Mobility   Post-Mobility: 80 HR, 93% SpO2  Received pt in chair having no complaints and agreeable to mobility. Pt was asymptomatic throughout ambulation and returned to room w/o fault. Left in chair w/ call bell in reach and all needs met.  Christopher Day Mobility Specialist Please contact via SecureChat or Rehab office at [phone]  

## 2022-09-30 ENCOUNTER — Other Ambulatory Visit: Payer: Self-pay | Admitting: Nurse Practitioner

## 2022-10-01 DIAGNOSIS — I7143 Infrarenal abdominal aortic aneurysm, without rupture: Secondary | ICD-10-CM | POA: Diagnosis not present

## 2022-10-01 DIAGNOSIS — I951 Orthostatic hypotension: Secondary | ICD-10-CM | POA: Diagnosis not present

## 2022-10-01 DIAGNOSIS — N184 Chronic kidney disease, stage 4 (severe): Secondary | ICD-10-CM | POA: Diagnosis not present

## 2022-10-01 DIAGNOSIS — I129 Hypertensive chronic kidney disease with stage 1 through stage 4 chronic kidney disease, or unspecified chronic kidney disease: Secondary | ICD-10-CM | POA: Diagnosis not present

## 2022-10-01 DIAGNOSIS — Z48815 Encounter for surgical aftercare following surgery on the digestive system: Secondary | ICD-10-CM | POA: Diagnosis not present

## 2022-10-01 DIAGNOSIS — I25708 Atherosclerosis of coronary artery bypass graft(s), unspecified, with other forms of angina pectoris: Secondary | ICD-10-CM | POA: Diagnosis not present

## 2022-10-01 DIAGNOSIS — D62 Acute posthemorrhagic anemia: Secondary | ICD-10-CM | POA: Diagnosis not present

## 2022-10-01 DIAGNOSIS — E538 Deficiency of other specified B group vitamins: Secondary | ICD-10-CM | POA: Diagnosis not present

## 2022-10-01 DIAGNOSIS — D631 Anemia in chronic kidney disease: Secondary | ICD-10-CM | POA: Diagnosis not present

## 2022-10-01 DIAGNOSIS — I480 Paroxysmal atrial fibrillation: Secondary | ICD-10-CM | POA: Diagnosis not present

## 2022-10-02 ENCOUNTER — Ambulatory Visit: Payer: Medicare PPO | Attending: Cardiology | Admitting: Cardiology

## 2022-10-02 ENCOUNTER — Encounter: Payer: Self-pay | Admitting: Cardiology

## 2022-10-02 VITALS — BP 130/60 | HR 68 | Ht 71.0 in | Wt 217.0 lb

## 2022-10-02 DIAGNOSIS — I2510 Atherosclerotic heart disease of native coronary artery without angina pectoris: Secondary | ICD-10-CM

## 2022-10-02 DIAGNOSIS — I10 Essential (primary) hypertension: Secondary | ICD-10-CM | POA: Diagnosis not present

## 2022-10-02 DIAGNOSIS — R040 Epistaxis: Secondary | ICD-10-CM | POA: Diagnosis not present

## 2022-10-02 DIAGNOSIS — Z79899 Other long term (current) drug therapy: Secondary | ICD-10-CM

## 2022-10-02 DIAGNOSIS — T148XXA Other injury of unspecified body region, initial encounter: Secondary | ICD-10-CM | POA: Diagnosis not present

## 2022-10-02 DIAGNOSIS — N289 Disorder of kidney and ureter, unspecified: Secondary | ICD-10-CM | POA: Diagnosis not present

## 2022-10-02 DIAGNOSIS — I4819 Other persistent atrial fibrillation: Secondary | ICD-10-CM

## 2022-10-02 DIAGNOSIS — Z9049 Acquired absence of other specified parts of digestive tract: Secondary | ICD-10-CM | POA: Diagnosis not present

## 2022-10-02 DIAGNOSIS — D649 Anemia, unspecified: Secondary | ICD-10-CM | POA: Diagnosis not present

## 2022-10-02 DIAGNOSIS — Z09 Encounter for follow-up examination after completed treatment for conditions other than malignant neoplasm: Secondary | ICD-10-CM | POA: Diagnosis not present

## 2022-10-02 DIAGNOSIS — N179 Acute kidney failure, unspecified: Secondary | ICD-10-CM | POA: Diagnosis not present

## 2022-10-02 DIAGNOSIS — D6869 Other thrombophilia: Secondary | ICD-10-CM | POA: Diagnosis not present

## 2022-10-02 DIAGNOSIS — Z6830 Body mass index (BMI) 30.0-30.9, adult: Secondary | ICD-10-CM | POA: Diagnosis not present

## 2022-10-02 DIAGNOSIS — I7143 Infrarenal abdominal aortic aneurysm, without rupture: Secondary | ICD-10-CM | POA: Diagnosis not present

## 2022-10-02 NOTE — Progress Notes (Signed)
Electrophysiology Office Note   Date:  10/02/2022   ID:  MUTASIM TUCKEY, DOB Jan 20, 1946, MRN [id number]  PCP:  Lurline Del, DO  Cardiologist:  Tamala Julian Primary Electrophysiologist:  Micaylah Bertucci Meredith Leeds, MD    Chief Complaint: AF   History of Present Illness: Erik Perez is a 77 y.o. male who is being seen today for the evaluation of AF at the request of Sherran Needs, NP. Presenting today for electrophysiology evaluation.  Has a history significant for coronary artery disease post CABG, hypertension, OSA, prostate cancer, hyperlipidemia, carotid artery disease, atrial fibrillation.  He was diagnosed with atrial fibrillation 07/10/2022 after presenting to emergency room with palpitations and chest pain.  He had a second ER visit 08/20/2022 with atrial fibrillation.  He has been started on amiodarone.  Since starting amiodarone he has done well with minimal episodes of atrial fibrillation.  Unfortunately he did develop COVID and had a cholecystectomy this month.  He is feeling tired and fatigued, but he feels that it was due to the above issues.  Today, he denies symptoms of palpitations, chest pain, shortness of breath, orthopnea, PND, lower extremity edema, claudication, dizziness, presyncope, syncope, bleeding, or neurologic sequela. The patient is tolerating medications without difficulties.    Past Medical History:  Diagnosis Date   Carotid artery obstruction    Bilateral moderate carotid obstruction 2013 doppler   Coronary artery disease    Hyperlipidemia    Hypertension    OSA (obstructive sleep apnea)    Split 02-08-07 AHI total 32/hr; REM 60/hr O2 sat min NREM 88% REM 88%   Past Surgical History:  Procedure Laterality Date   CERVICAL DISC SURGERY  2009   CHOLECYSTECTOMY N/A 09/23/2022   Procedure: LAPAROSCOPIC CHOLECYSTECTOMY;  Surgeon: Felicie Morn, MD;  Location: [place];  Service: General;  Laterality: N/A;   [place],  to LAD, RIMA to RCA 2000,Cfx. Taxus stent 2002     Current Outpatient Medications  Medication Sig Dispense Refill   acetaminophen (TYLENOL) 325 MG tablet Take 2 tablets (650 mg total) by mouth every 6 (six) hours as needed for mild pain (or temp > 100).     allopurinol (ZYLOPRIM) 100 MG tablet Take 100 mg by mouth 2 (two) times daily.     amiodarone (PACERONE) 200 MG tablet Take 200 mg by mouth 2 (two) times daily. Through Dec. 15th and then goes to once daily     amLODipine (NORVASC) 5 MG tablet Take 1 tablet (5 mg total) by mouth 2 (two) times daily as needed (Please take if systolic blood pressure is persistently> 160).     apixaban (ELIQUIS) 5 MG TABS tablet Take 1 tablet (5 mg total) by mouth 2 (two) times daily. 60 tablet 3   atorvastatin (LIPITOR) 40 MG tablet Take 40 mg by mouth daily.     calcium gluconate 500 MG tablet Take 1 tablet by mouth 3 (three) times daily.     Cholecalciferol (VITAMIN D) 2000 UNITS tablet Take 2,000 Units by mouth 2 (two) times daily.      Cyanocobalamin (B-12 IJ) Take one (1) injection into the muscle once a month.     diltiazem (CARDIZEM) 30 MG tablet Take 1 tablet (30 mg total) by mouth 2 (two) times daily as needed (symptomatic atrial fibrillation lasting longer than 5 minutes). 30 tablet 0   doxazosin (CARDURA) 4 MG tablet Take 4 mg by mouth daily.     fexofenadine (ALLEGRA) 180  MG tablet Take 180 mg by mouth daily.     furosemide (LASIX) 20 MG tablet Take 20 mg oral daily for next 5 days, then as needed for edema or fluid retention 30 tablet 0   Metoprolol Tartrate 75 MG TABS TAKE 1 TABLET BY MOUTH 2 TIMES DAILY. 30 tablet 2   Multiple Vitamin (MULTIVITAMIN) tablet Take 1 tablet by mouth daily.     nitroGLYCERIN (NITROSTAT) 0.4 MG SL tablet Place 1 tablet (0.4 mg total) under the tongue every 5 (five) minutes as needed for chest pain. 25 tablet 1   Omega-3 Fatty Acids (FISH OIL) 1200 MG CAPS Take 1,200 mg by mouth 2 (two) times daily.     pantoprazole  (PROTONIX) 40 MG tablet Take 1 tablet (40 mg total) by mouth daily. 30 tablet 0   sodium bicarbonate 650 MG tablet Take 1 tablet (650 mg total) by mouth 2 (two) times daily. 60 tablet 0   No current facility-administered medications for this visit.    Allergies:   Colchicine, Diclofenac sodium, Gabapentin, and Niaspan [niacin er]   Social History:  The patient  reports that he quit smoking about 39 years ago. His smoking use included cigarettes. He started smoking about 62 years ago. He has a 7.59 pack-year smoking history. He has never used smokeless tobacco. He reports current alcohol use. He reports that he does not use drugs.   Family History:  The patient's family history includes Heart disease in his brother, father, and mother.    ROS:  Please see the history of present illness.   Otherwise, review of systems is positive for none.   All other systems are reviewed and negative.    PHYSICAL EXAM: VS:  BP 130/60   Pulse 68   Ht '5\' 11"'$  (1.803 m)   Wt 217 lb (98.4 kg)   SpO2 98%   BMI 30.27 kg/m  , BMI Body mass index is 30.27 kg/m. GEN: Well nourished, well developed, in no acute distress  HEENT: normal  Neck: no JVD, carotid bruits, or masses Cardiac: RRR; no murmurs, rubs, or gallops,no edema  Respiratory:  clear to auscultation bilaterally, normal work of breathing GI: soft, nontender, nondistended, + BS MS: no deformity or atrophy  Skin: warm and dry Neuro:  Strength and sensation are intact Psych: euthymic mood, full affect  EKG:  EKG is not ordered today. Personal review of the ekg ordered 09/21/22 shows sinus rhythm  Recent Labs: 04/08/2022: NT-Pro BNP 174 07/10/2022: TSH 1.671 09/24/2022: B Natriuretic Peptide 463.9 09/26/2022: Magnesium 2.2 09/27/2022: ALT 38 09/29/2022: BUN 48; Creatinine, Ser 3.52; Hemoglobin 8.5; Platelets 195; Potassium 3.6; Sodium 139    Lipid Panel     Component Value Date/Time   CHOL 123 05/22/2014 1021   TRIG 135.0 05/22/2014 1021    HDL 28.60 (L) 05/22/2014 1021   CHOLHDL 4 05/22/2014 1021   VLDL 27.0 05/22/2014 1021   LDLCALC 67 05/22/2014 1021     Wt Readings from Last 3 Encounters:  10/02/22 217 lb (98.4 kg)  09/27/22 224 lb 8 oz (101.8 kg)  09/12/22 230 lb (104.3 kg)      Other studies Reviewed: Additional studies/ records that were reviewed today include: TTE 04/11/22  Review of the above records today demonstrates:   1. Left ventricular ejection fraction, by estimation, is 60 to 65%. Left  ventricular ejection fraction by 3D volume is 60 %. The left ventricle has  normal function. The left ventricle has no regional wall motion  abnormalities.  Left ventricular diastolic   parameters were normal.   2. Right ventricular systolic function is normal. The right ventricular  size is normal. Tricuspid regurgitation signal is inadequate for assessing  PA pressure.   3. The mitral valve is normal in structure. No evidence of mitral valve  regurgitation. No evidence of mitral stenosis.   4. The aortic valve is tricuspid. Aortic valve regurgitation is not  visualized. Aortic valve sclerosis/calcification is present, without any  evidence of aortic stenosis.   5. The inferior vena cava is normal in size with greater than 50%  respiratory variability, suggesting right atrial pressure of 3 mmHg.   Cardiac monitor 09/29/2022 personally reviewed   Basic underlying rhythm is NSR   < 1% AF burden over duration of study. Two episodes occurred.   The AF episode with the longest duration was 7 hours on 08/20/2022. The second episode lasted < 21 minutes   PVC and PAC burden < 1%  ASSESSMENT AND PLAN:  1.  Paroxysmal atrial fibrillation: CHA2DS2-VASc of 4.  Currently on Eliquis 5 mg twice daily, amiodarone 200 mg daily.  Recent monitor shows less than 1% atrial fibrillation burden.  He would like to get off of amiodarone.  Due to that, we Tanzie Rothschild plan for ablation.  Risk and benefits have been discussed.  He understands  these risks and agreed to the procedure.  Risk, benefits, and alternatives to EP study and radiofrequency ablation for afib were also discussed in detail today. These risks include but are not limited to stroke, bleeding, vascular damage, tamponade, perforation, damage to the esophagus, lungs, and other structures, pulmonary vein stenosis, worsening renal function, and death. The patient understands these risk and wishes to proceed.  We Valerie Fredin therefore proceed with catheter ablation at the next available time.  Carto, ICE, anesthesia are requested for the procedure.  Alejos Reinhardt also obtain CT PV protocol prior to the procedure to exclude LAA thrombus and further evaluate atrial anatomy.  2.  Secondary hypercoagulable state: Currently on Eliquis for atrial fibrillation as above  3.  High risk medication monitoring: Currently on amiodarone as above.  Recent labs without abnormality.  4.  Obesity: Lifestyle modification encouraged Body mass index is 30.27 kg/m.  5.  Obstructive sleep apnea: CPAP compliance encouraged  6.  Coronary artery disease: No current chest pain.  7.  Hypertension: Currently well-controlled    Current medicines are reviewed at length with the patient today.   The patient does not have concerns regarding his medicines.  The following changes were made today:  none  Labs/ tests ordered today include:  No orders of the defined types were placed in this encounter.    Disposition:   FU with Erilyn Pearman 3 months  Signed, Santos Hardwick Meredith Leeds, MD  10/02/2022 12:22 PM     [address] [place] [address] [phone] (office) [phone] (fax)

## 2022-10-02 NOTE — Patient Instructions (Addendum)
Medication Instructions:  Your physician recommends that you continue on your current medications as directed. Please refer to the Current Medication list given to you today.  *If you need a refill on your cardiac medications before your next appointment, please call your pharmacy*   Lab Work: Pre procedure labs -- see procedure instruction letter:  BMP & CBC  If you have labs (blood work) drawn today and your tests are completely normal, you will receive your results only by: [place] (if you have MyChart) OR A paper copy in the mail If you have any lab test that is abnormal or we need to change your treatment, we will call you to review the results.   Testing/Procedures: Your physician has requested that you have cardiac CT within 7 days PRIOR to your ablation. Cardiac computed tomography (CT) is a painless test that uses an x-ray machine to take clear, detailed pictures of your heart.  Please follow instruction below located under "other instructions". You will get a call from our office to schedule the date for this test.  Your physician has recommended that you have an ablation. Catheter ablation is a medical procedure used to treat some cardiac arrhythmias (irregular heartbeats). During catheter ablation, a long, thin, flexible tube is put into a blood vessel in your groin (upper thigh), or neck. This tube is called an ablation catheter. It is then guided to your heart through the blood vessel. Radio frequency waves destroy small areas of heart tissue where abnormal heartbeats may cause an arrhythmia to start. Please follow instruction letter given to you today.  You are scheduled for 02/11/2023   Follow-Up: At [facility], you and your health needs are our priority.  As part of our continuing mission to provide you with exceptional heart care, we have created designated Provider Care Teams.  These Care Teams include your primary Cardiologist (physician) and Advanced Practice  Providers (APPs -  Physician Assistants and Nurse Practitioners) who all work together to provide you with the care you need, when you need it.  Your next appointment:   1 month(s) after your ablation  The format for your next appointment:   In Person  Provider:   AFib clinic   Thank you for choosing [facility]!!   Trinidad Curet, RN [phone]    Other Instructions   Cardiac Ablation Cardiac ablation is a procedure to destroy (ablate) some heart tissue that is sending bad signals. These bad signals cause problems in heart rhythm. The heart has many areas that make these signals. If there are problems in these areas, they can make the heart beat in a way that is not normal. Destroying some tissues can help make the heart rhythm normal. Tell your doctor about: Any allergies you have. All medicines you are taking. These include vitamins, herbs, eye drops, creams, and over-the-counter medicines. Any problems you or family members have had with medicines that make you fall asleep (anesthetics). Any blood disorders you have. Any surgeries you have had. Any medical conditions you have, such as kidney failure. Whether you are pregnant or may be pregnant. What are the risks? This is a safe procedure. But problems may occur, including: Infection. Bruising and bleeding. Bleeding into the chest. Stroke or blood clots. Damage to nearby areas of your body. Allergies to medicines or dyes. The need for a pacemaker if the normal system is damaged. Failure of the procedure to treat the problem. What happens before the procedure? Medicines Ask your doctor about: Changing  or stopping your normal medicines. This is important. Taking aspirin and ibuprofen. Do not take these medicines unless your doctor tells you to take them. Taking other medicines, vitamins, herbs, and supplements. General instructions Follow instructions from your doctor about what you cannot eat or drink. Plan  to have someone take you home from the hospital or clinic. If you will be going home right after the procedure, plan to have someone with you for 24 hours. Ask your doctor what steps will be taken to prevent infection. What happens during the procedure?  An IV tube will be put into one of your veins. You will be given a medicine to help you relax. The skin on your neck or groin will be numbed. A cut (incision) will be made in your neck or groin. A needle will be put through your cut and into a large vein. A tube (catheter) will be put into the needle. The tube will be moved to your heart. Dye may be put through the tube. This helps your doctor see your heart. Small devices (electrodes) on the tube will send out signals. A type of energy will be used to destroy some heart tissue. The tube will be taken out. Pressure will be held on your cut. This helps stop bleeding. A bandage will be put over your cut. The exact procedure may vary among doctors and hospitals. What happens after the procedure? You will be watched until you leave the hospital or clinic. This includes checking your heart rate, breathing rate, oxygen, and blood pressure. Your cut will be watched for bleeding. You will need to lie still for a few hours. Do not drive for 24 hours or as long as your doctor tells you. Summary Cardiac ablation is a procedure to destroy some heart tissue. This is done to treat heart rhythm problems. Tell your doctor about any medical conditions you may have. Tell him or her about all medicines you are taking to treat them. This is a safe procedure. But problems may occur. These include infection, bruising, bleeding, and damage to nearby areas of your body. Follow what your doctor tells you about food and drink. You may also be told to change or stop some of your medicines. After the procedure, do not drive for 24 hours or as long as your doctor tells you. This information is not intended to replace  advice given to you by your health care provider. Make sure you discuss any questions you have with your health care provider. Document Revised: 12/06/2021 Document Reviewed: 08/18/2019 Elsevier Patient Education  [place].

## 2022-10-03 DIAGNOSIS — I951 Orthostatic hypotension: Secondary | ICD-10-CM | POA: Diagnosis not present

## 2022-10-03 DIAGNOSIS — D62 Acute posthemorrhagic anemia: Secondary | ICD-10-CM | POA: Diagnosis not present

## 2022-10-03 DIAGNOSIS — D631 Anemia in chronic kidney disease: Secondary | ICD-10-CM | POA: Diagnosis not present

## 2022-10-03 DIAGNOSIS — I129 Hypertensive chronic kidney disease with stage 1 through stage 4 chronic kidney disease, or unspecified chronic kidney disease: Secondary | ICD-10-CM | POA: Diagnosis not present

## 2022-10-03 DIAGNOSIS — Z48815 Encounter for surgical aftercare following surgery on the digestive system: Secondary | ICD-10-CM | POA: Diagnosis not present

## 2022-10-03 DIAGNOSIS — I480 Paroxysmal atrial fibrillation: Secondary | ICD-10-CM | POA: Diagnosis not present

## 2022-10-03 DIAGNOSIS — N184 Chronic kidney disease, stage 4 (severe): Secondary | ICD-10-CM | POA: Diagnosis not present

## 2022-10-03 DIAGNOSIS — I7143 Infrarenal abdominal aortic aneurysm, without rupture: Secondary | ICD-10-CM | POA: Diagnosis not present

## 2022-10-03 DIAGNOSIS — I25708 Atherosclerosis of coronary artery bypass graft(s), unspecified, with other forms of angina pectoris: Secondary | ICD-10-CM | POA: Diagnosis not present

## 2022-10-06 DIAGNOSIS — N2581 Secondary hyperparathyroidism of renal origin: Secondary | ICD-10-CM | POA: Diagnosis not present

## 2022-10-06 DIAGNOSIS — Z8616 Personal history of COVID-19: Secondary | ICD-10-CM | POA: Diagnosis not present

## 2022-10-06 DIAGNOSIS — N1832 Chronic kidney disease, stage 3b: Secondary | ICD-10-CM | POA: Diagnosis not present

## 2022-10-06 DIAGNOSIS — D631 Anemia in chronic kidney disease: Secondary | ICD-10-CM | POA: Diagnosis not present

## 2022-10-06 DIAGNOSIS — N179 Acute kidney failure, unspecified: Secondary | ICD-10-CM | POA: Diagnosis not present

## 2022-10-06 DIAGNOSIS — I480 Paroxysmal atrial fibrillation: Secondary | ICD-10-CM | POA: Diagnosis not present

## 2022-10-06 DIAGNOSIS — N189 Chronic kidney disease, unspecified: Secondary | ICD-10-CM | POA: Diagnosis not present

## 2022-10-06 DIAGNOSIS — I129 Hypertensive chronic kidney disease with stage 1 through stage 4 chronic kidney disease, or unspecified chronic kidney disease: Secondary | ICD-10-CM | POA: Diagnosis not present

## 2022-10-07 DIAGNOSIS — D631 Anemia in chronic kidney disease: Secondary | ICD-10-CM | POA: Diagnosis not present

## 2022-10-07 DIAGNOSIS — Z48815 Encounter for surgical aftercare following surgery on the digestive system: Secondary | ICD-10-CM | POA: Diagnosis not present

## 2022-10-07 DIAGNOSIS — I480 Paroxysmal atrial fibrillation: Secondary | ICD-10-CM | POA: Diagnosis not present

## 2022-10-07 DIAGNOSIS — I951 Orthostatic hypotension: Secondary | ICD-10-CM | POA: Diagnosis not present

## 2022-10-07 DIAGNOSIS — N184 Chronic kidney disease, stage 4 (severe): Secondary | ICD-10-CM | POA: Diagnosis not present

## 2022-10-07 DIAGNOSIS — I25708 Atherosclerosis of coronary artery bypass graft(s), unspecified, with other forms of angina pectoris: Secondary | ICD-10-CM | POA: Diagnosis not present

## 2022-10-07 DIAGNOSIS — I129 Hypertensive chronic kidney disease with stage 1 through stage 4 chronic kidney disease, or unspecified chronic kidney disease: Secondary | ICD-10-CM | POA: Diagnosis not present

## 2022-10-07 DIAGNOSIS — I7143 Infrarenal abdominal aortic aneurysm, without rupture: Secondary | ICD-10-CM | POA: Diagnosis not present

## 2022-10-07 DIAGNOSIS — D62 Acute posthemorrhagic anemia: Secondary | ICD-10-CM | POA: Diagnosis not present

## 2022-10-09 DIAGNOSIS — I7143 Infrarenal abdominal aortic aneurysm, without rupture: Secondary | ICD-10-CM | POA: Diagnosis not present

## 2022-10-09 DIAGNOSIS — I480 Paroxysmal atrial fibrillation: Secondary | ICD-10-CM | POA: Diagnosis not present

## 2022-10-09 DIAGNOSIS — I951 Orthostatic hypotension: Secondary | ICD-10-CM | POA: Diagnosis not present

## 2022-10-09 DIAGNOSIS — I25708 Atherosclerosis of coronary artery bypass graft(s), unspecified, with other forms of angina pectoris: Secondary | ICD-10-CM | POA: Diagnosis not present

## 2022-10-09 DIAGNOSIS — D631 Anemia in chronic kidney disease: Secondary | ICD-10-CM | POA: Diagnosis not present

## 2022-10-09 DIAGNOSIS — Z48815 Encounter for surgical aftercare following surgery on the digestive system: Secondary | ICD-10-CM | POA: Diagnosis not present

## 2022-10-09 DIAGNOSIS — D62 Acute posthemorrhagic anemia: Secondary | ICD-10-CM | POA: Diagnosis not present

## 2022-10-09 DIAGNOSIS — I129 Hypertensive chronic kidney disease with stage 1 through stage 4 chronic kidney disease, or unspecified chronic kidney disease: Secondary | ICD-10-CM | POA: Diagnosis not present

## 2022-10-09 DIAGNOSIS — N184 Chronic kidney disease, stage 4 (severe): Secondary | ICD-10-CM | POA: Diagnosis not present

## 2022-10-10 DIAGNOSIS — G4733 Obstructive sleep apnea (adult) (pediatric): Secondary | ICD-10-CM | POA: Diagnosis not present

## 2022-10-13 MED ORDER — METOPROLOL TARTRATE 50 MG PO TABS: 50.0000 mg | ORAL_TABLET | Freq: Two times a day (BID) | ORAL | 3 refills | Status: DC

## 2022-10-13 NOTE — Telephone Encounter (Signed)
Sent in script for metoprolol 50 mg PO BID per Jaquelyn Bitter, NP note.  Please advise patient to reduce metoprolol tartrate to 50 mg twice daily and continue to check blood pressures.  Please contact us if you have any additional questions. Ambrose Pancoast, NP

## 2022-10-14 DIAGNOSIS — N184 Chronic kidney disease, stage 4 (severe): Secondary | ICD-10-CM | POA: Diagnosis not present

## 2022-10-14 DIAGNOSIS — I7143 Infrarenal abdominal aortic aneurysm, without rupture: Secondary | ICD-10-CM | POA: Diagnosis not present

## 2022-10-14 DIAGNOSIS — D631 Anemia in chronic kidney disease: Secondary | ICD-10-CM | POA: Diagnosis not present

## 2022-10-14 DIAGNOSIS — I951 Orthostatic hypotension: Secondary | ICD-10-CM | POA: Diagnosis not present

## 2022-10-14 DIAGNOSIS — D62 Acute posthemorrhagic anemia: Secondary | ICD-10-CM | POA: Diagnosis not present

## 2022-10-14 DIAGNOSIS — Z48815 Encounter for surgical aftercare following surgery on the digestive system: Secondary | ICD-10-CM | POA: Diagnosis not present

## 2022-10-14 DIAGNOSIS — I25708 Atherosclerosis of coronary artery bypass graft(s), unspecified, with other forms of angina pectoris: Secondary | ICD-10-CM | POA: Diagnosis not present

## 2022-10-14 DIAGNOSIS — I480 Paroxysmal atrial fibrillation: Secondary | ICD-10-CM | POA: Diagnosis not present

## 2022-10-14 DIAGNOSIS — I4891 Unspecified atrial fibrillation: Secondary | ICD-10-CM | POA: Diagnosis not present

## 2022-10-14 DIAGNOSIS — I129 Hypertensive chronic kidney disease with stage 1 through stage 4 chronic kidney disease, or unspecified chronic kidney disease: Secondary | ICD-10-CM | POA: Diagnosis not present

## 2022-10-14 DIAGNOSIS — G4733 Obstructive sleep apnea (adult) (pediatric): Secondary | ICD-10-CM | POA: Diagnosis not present

## 2022-10-15 DIAGNOSIS — N281 Cyst of kidney, acquired: Secondary | ICD-10-CM | POA: Diagnosis not present

## 2022-10-16 ENCOUNTER — Telehealth: Payer: Self-pay | Admitting: Cardiology

## 2022-10-16 ENCOUNTER — Encounter: Payer: Self-pay | Admitting: Cardiology

## 2022-10-16 ENCOUNTER — Other Ambulatory Visit (HOSPITAL_COMMUNITY): Payer: Self-pay

## 2022-10-16 NOTE — Telephone Encounter (Signed)
Spent about 25 min Woke up around 8am, didn't feel quite right. HRs were ranging 110s - 120s Took Diltiazem 30 mg around 9 am Back in rhythm by around 11:30.  He also experienced one other event before Christmas, lasted about an hour.  Had Covid 12/15.  Pt BP sometimes low before morning medications. Advised we would further make a plan in the morning when they see APP. Aware I will come discuss situation w/ APP and say hello to them as well. Patient, wife & son verbalized understanding and agreeable to plan.

## 2022-10-16 NOTE — Telephone Encounter (Signed)
Patient c/o Palpitations:  High priority if patient c/o lightheadedness, shortness of breath, or chest pain  How long have you had palpitations/irregular HR/ Afib? Are you having the symptoms now?  Patient's son states patient went into afib this morning before 8:30 AM - He states he is back in rhythm now  Are you currently experiencing lightheadedness, SOB or CP?  No   Do you have a history of afib (atrial fibrillation) or irregular heart rhythm?  Yes   Have you checked your BP or HR? (document readings if available):  11:30 AM: Seated 102/49 59        Standing 81/42 63  Are you experiencing any other symptoms?  Lightheadedness, mainly when standing

## 2022-10-16 NOTE — Progress Notes (Signed)
Office Visit    Patient Name: Erik Perez Date of Encounter: 10/17/2022  Primary Care Provider:  Lurline Del, DO Primary Cardiologist:  Sinclair Grooms, MD Primary Electrophysiologist: Constance Haw, MD  Chief Complaint   Erik Perez is a 77 y.o. male with PMH of new onset atrial fibrillation (on Eliquis) CAD s/p CABG (LIMA LAD; RIMA RCA, 2009) and Taxus stent in the native circumflex, hypertension, OSA on CPAP, prostate CA, AAA, hyperlipidemia, and bilateral carotid disease who presents today for 55-monthfollow-up of atrial fibrillation.  Past Medical History    Past Medical History:  Diagnosis Date   Carotid artery obstruction    Bilateral moderate carotid obstruction 2013 doppler   Coronary artery disease    Hyperlipidemia    Hypertension    OSA (obstructive sleep apnea)    Split 02-08-07 AHI total 32/hr; REM 60/hr O2 sat min NREM 88% REM 88%   Past Surgical History:  Procedure Laterality Date   CERVICAL DISC SURGERY  2009   CHOLECYSTECTOMY N/A 09/23/2022   Procedure: LAPAROSCOPIC CHOLECYSTECTOMY;  Surgeon: SFelicie Morn MD;  Location: [facility]  Service: General;  Laterality: N/A;   CMelrose to LAD, RIMA to RCA 2000,Cfx. Taxus stent 2002    Allergies  Allergies  Allergen Reactions   Colchicine Other (See Comments)   Diclofenac Sodium     Chest Pain   Gabapentin     Increased sedation    Niaspan [Niacin Er]     Involuntary facial flushing    History of Present Illness    Erik Perez is a 77year old male with the above mention past medical history who presents today for follow-up of atrial fibrillation.  Mr. Erik Carawayhas been followed by Dr. STamala Juliansince 2015 for management of coronary artery disease.  He has a history of CABG in 2000 and Taxus stent placed to the circumflex in 2009.  Most recent 2D echo was completed 03/2022 showing EF of 60-65%, no RWMA, normal RV systolic function with no  evidence of MV or AV regurgitation and no LVH.   He was last seen by Dr. STamala Julianon 07/2021 and was doing well with no cardiac complaints. Mr. Erik Carawaywas admitted on 07/10/2022 with new onset atrial fibrillation.  High-sensitivity troponins were negative x2 patient's creatinine was 1.95 with normal thyroid studies.  He was started on Eliquis 5 mg twice daily with plan to undergo DCCV in 3 weeks.  He was started on metoprolol for rate control.  He was seen by RAdline Peals PA on 07/17/2022 and had converted to sinus rhythm spontaneously.  Required no DCCV and discussed the possibility of wearing event monitor to quantify AF burden.  He was seen by me 08/14/2022 for complaint of new onset AF.  During visit patient endorsed increased fatigue and blood pressure was noted to be 134/70.  He he was feeling well during office visit and heart rate was 52 bpm.  He noticed some increased fatigue with metoprolol 100 mg and this was reduced to 75 mg twice daily.  He was given a 30-day event monitor to assess AF burden.  He also endorsed chest discomfort with ambulation and Lexiscan Myoview was ordered for further evaluation.  Blood pressure was noted to be also slightly elevated and HCTZ was increased to 37.5 mg.  He was seen in the ED 08/20/2021 after going into AF with RVR and was cardioverted to sinus rhythm.  He was started on amiodarone due to CKD.  He was seen by Roderic Palau on 12/1 in the AF clinic for further evaluation.  They discussed the possibility of possible AF ablation candidacy after maintaining rate control on amiodarone.  He was referred to EP for further evaluation.  He was seen in office on 09/04/2022 for follow-up and endorsed occasional bouts of shortness of breath and dizziness since starting amiodarone.  He endorsed some increased fatigue and metoprolol was decreased to 75 mg twice daily.  He was maintaining sinus rhythm with a rate of 50 bpm.  He endorsed some chest discomfort with walking and  Lexiscan Myoview was ordered that revealed no evidence of ischemia and was low risk.  He presented to the ED and was admitted 09/12/2022 with complaint of mild shortness of breath and fatigue and was diagnosed with COVID and started on molnupiravir.  He presented again to the ED 09/17/2022 with complaint of abdominal pain with right-sided radiation to right shoulder.  He was found to have acute cholecystitis and underwent lap chole on 12/26.  He had follow-up with general surgery 10/02/2022.  Erik Perez presents today with his son for posthospital follow-up.  Since last being seen in the office patient reports that he is doing much better but does report a fall that occurred on 1/8 where he hit his knee but did not strike his head.  He reports that his blood pressures have been labile and today blood pressure is 118/62.  During today's visit he denied any adverse medication reactions or bouts of dizziness with ambulation.  He also notes that he experienced a bout of AF with RVR 2 days prior which resolved with as needed Cardizem.  He was recently seen by his general surgeon and had his JP drain removed.  He reports that he has noticed increased dizziness with current medication regimen and is now using a walker for ambulation and security purposes.  During our visit we discussed his current medication regimen and also discussed adverse reactions associated with his current BP medications.  I was also able to speak with his other son over the phone and all questions were answered regarding his medications to their satisfaction.  Patient denies chest pain, palpitations, dyspnea, PND, orthopnea, nausea, vomiting, dizziness, syncope, edema, weight gain, or early satiety.  Home Medications    Current Outpatient Medications  Medication Sig Dispense Refill   acetaminophen (TYLENOL) 325 MG tablet Take 2 tablets (650 mg total) by mouth every 6 (six) hours as needed for mild pain (or temp > 100).     allopurinol  (ZYLOPRIM) 100 MG tablet Take 100 mg by mouth 2 (two) times daily.     amiodarone (PACERONE) 200 MG tablet Take 200 mg by mouth daily. Through Dec. 15th and then goes to once daily     apixaban (ELIQUIS) 5 MG TABS tablet Take 1 tablet (5 mg total) by mouth 2 (two) times daily. 60 tablet 3   atorvastatin (LIPITOR) 40 MG tablet Take 40 mg by mouth daily.     calcium gluconate 500 MG tablet Take 1 tablet by mouth 3 (three) times daily.     Cholecalciferol (VITAMIN D) 2000 UNITS tablet Take 2,000 Units by mouth 2 (two) times daily.      Cyanocobalamin (B-12 IJ) Take one (1) injection into the muscle once a month.     diltiazem (CARDIZEM) 30 MG tablet Take 1 tablet (30 mg total) by mouth 2 (two) times daily as needed (symptomatic atrial  fibrillation lasting longer than 5 minutes). (Patient taking differently: Take 30 mg by mouth 2 (two) times daily as needed (symptomatic atrial fibrillation lasting longer than 5 minutes). Up to 4 times per day) 30 tablet 0   doxazosin (CARDURA) 4 MG tablet Take 4 mg by mouth daily.     fexofenadine (ALLEGRA) 180 MG tablet Take 180 mg by mouth daily.     furosemide (LASIX) 20 MG tablet Take 20 mg oral daily for next 5 days, then as needed for edema or fluid retention 30 tablet 0   metoprolol tartrate (LOPRESSOR) 50 MG tablet Take 1 tablet (50 mg total) by mouth 2 (two) times daily. 180 tablet 3   Multiple Vitamin (MULTIVITAMIN) tablet Take 1 tablet by mouth daily.     nitroGLYCERIN (NITROSTAT) 0.4 MG SL tablet Place 1 tablet (0.4 mg total) under the tongue every 5 (five) minutes as needed for chest pain. 25 tablet 1   Omega-3 Fatty Acids (FISH OIL) 1200 MG CAPS Take 1,200 mg by mouth 2 (two) times daily.     amLODipine (NORVASC) 5 MG tablet Take 1 tablet (5 mg total) by mouth 2 (two) times daily as needed (Please take if systolic blood pressure is persistently> 160). (Patient not taking: Reported on 10/17/2022)     pantoprazole (PROTONIX) 40 MG tablet Take 1 tablet (40 mg  total) by mouth daily. 30 tablet 0   sodium bicarbonate 650 MG tablet Take 1 tablet (650 mg total) by mouth 2 (two) times daily. 60 tablet 0   No current facility-administered medications for this visit.     Review of Systems  Please see the history of present illness.    (+) Trace lower extremity edema (+) Dizziness with ambulation  All other systems reviewed and are otherwise negative except as noted above.  Physical Exam    Wt Readings from Last 3 Encounters:  10/17/22 205 lb 6.4 oz (93.2 kg)  10/02/22 217 lb (98.4 kg)  09/27/22 224 lb 8 oz (101.8 kg)   VS: Vitals:   10/17/22 0847  BP: 118/62  Pulse: 67  SpO2: 94%  ,Body mass index is 28.65 kg/m.  Constitutional:      Appearance: Healthy appearance. Not in distress.  Neck:     Vascular: JVD normal.  Pulmonary:     Effort: Pulmonary effort is normal.     Breath sounds: No wheezing. No rales. Diminished in the bases Cardiovascular:     Normal rate. Regular rhythm. Normal S1. Normal S2.      Murmurs: There is no murmur.  Edema:    Trace lower extremity edema bilaterally Abdominal:     Palpations: Abdomen is soft non tender. There is no hepatomegaly.  Skin:    General: Skin is warm and dry.  Neurological:     General: No focal deficit present.     Mental Status: Alert and oriented to person, place and time.     Cranial Nerves: Cranial nerves are intact.  EKG/LABS/Other Studies Reviewed    ECG personally reviewed by me today -none completed today  Risk Assessment/Calculations:    CHA2DS2-VASc Score = 4   This indicates a 4.8% annual risk of stroke. The patient's score is based upon: CHF History: 0 HTN History: 1 Diabetes History: 0 Stroke History: 0 Vascular Disease History: 1 Age Score: 2 Gender Score: 0           Lab Results  Component Value Date   WBC 6.5 09/29/2022   HGB 8.5 (L) 09/29/2022  HCT 25.0 (L) 09/29/2022   MCV 96.5 09/29/2022   PLT 195 09/29/2022   Lab Results  Component  Value Date   CREATININE 3.52 (H) 09/29/2022   BUN 48 (H) 09/29/2022   NA 139 09/29/2022   K 3.6 09/29/2022   CL 104 09/29/2022   CO2 21 (L) 09/29/2022   Lab Results  Component Value Date   ALT 38 09/27/2022   AST 31 09/27/2022   ALKPHOS 30 (L) 09/27/2022   BILITOT 1.0 09/27/2022   Lab Results  Component Value Date   CHOL 123 05/22/2014   HDL 28.60 (L) 05/22/2014   LDLCALC 67 05/22/2014   TRIG 135.0 05/22/2014   CHOLHDL 4 05/22/2014    No results found for: "HGBA1C"  Assessment & Plan    1.  Paroxysmal atrial fibrillation: -New onset AF diagnosed on 07/10/2022 with rate control initiated with amiodarone 200 mg twice daily with dose changing to 200 mg -Continue Eliquis 5 mg twice daily.  His last creatinine was 1.95 and we will recheck BMET today to monitor correct dose. -We will continue Cardizem 30 mg twice daily and we will decrease the metoprolol to 50 mg daily -He was advised to continue as needed Cardizem 30 mg for elevated heart rate greater than 100 bpm. -CHA2DS2-VASc Score = 4 [CHF History: 0, HTN History: 1, Diabetes History: 0, Stroke History: 0, Vascular Disease History: 1, Age Score: 2, Gender Score: 0].  Therefore, the patient's annual risk of stroke is 4.8 %.       2.  Coronary artery disease: -s/p CABG 2000 with PCI andT axus stent in the native circumflex in 2009. -Today patient reports no chest pain with ambulation or physical activity. -Continue GDMT with atorvastatin 40 mg, metoprolol 50 mg -Lexiscan results revealed no evidence of ischemia   3.  Essential hypertension: -Blood pressure today is well-controlled at 118/62 -We will decrease Cardura to 2 mg at bedtime to decrease symptomatic orthostasis -Continue metoprolol 50 mg daily  4.  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 Hyperlipidemia: -Patient's last LDL cholesterol was  64 in 2020.  We will contact PCP for most recent readings. -Continue statin therapy as noted above    Disposition: Follow-up with APP in 3 months    Medication Adjustments/Labs and Tests Ordered: Current medicines are reviewed at length with the patient today.  Concerns regarding medicines are outlined above.   Signed, Mable Fill, Marissa Nestle, NP 10/17/2022, 9:42 AM [facility]  Note:  This document was prepared using Dragon voice recognition software and may include unintentional dictation errors.

## 2022-10-17 ENCOUNTER — Ambulatory Visit: Payer: Medicare PPO | Attending: Nurse Practitioner | Admitting: Nurse Practitioner

## 2022-10-17 ENCOUNTER — Encounter: Payer: Self-pay | Admitting: Nurse Practitioner

## 2022-10-17 ENCOUNTER — Ambulatory Visit: Payer: Medicare PPO | Admitting: Nurse Practitioner

## 2022-10-17 ENCOUNTER — Encounter (HOSPITAL_COMMUNITY)
Admission: RE | Admit: 2022-10-17 | Discharge: 2022-10-17 | Disposition: A | Discharge status: Home / Self Care | Payer: Medicare PPO | Source: Ambulatory Visit | Attending: Nephrology | Admitting: Nephrology

## 2022-10-17 VITALS — BP 118/62 | HR 67 | Ht 71.0 in | Wt 205.4 lb

## 2022-10-17 DIAGNOSIS — D631 Anemia in chronic kidney disease: Secondary | ICD-10-CM | POA: Insufficient documentation

## 2022-10-17 DIAGNOSIS — E782 Mixed hyperlipidemia: Secondary | ICD-10-CM

## 2022-10-17 DIAGNOSIS — N189 Chronic kidney disease, unspecified: Secondary | ICD-10-CM | POA: Insufficient documentation

## 2022-10-17 DIAGNOSIS — I480 Paroxysmal atrial fibrillation: Secondary | ICD-10-CM | POA: Diagnosis not present

## 2022-10-17 DIAGNOSIS — I2510 Atherosclerotic heart disease of native coronary artery without angina pectoris: Secondary | ICD-10-CM | POA: Diagnosis not present

## 2022-10-17 DIAGNOSIS — I7143 Infrarenal abdominal aortic aneurysm, without rupture: Secondary | ICD-10-CM

## 2022-10-17 DIAGNOSIS — I10 Essential (primary) hypertension: Secondary | ICD-10-CM

## 2022-10-17 MED ORDER — DOXAZOSIN MESYLATE 2 MG PO TABS: 2.0000 mg | ORAL_TABLET | Freq: Every day | ORAL | 1 refills | Status: DC

## 2022-10-17 MED ORDER — SODIUM CHLORIDE 0.9 % IV SOLN
510.0000 mg | INTRAVENOUS | Status: DC
  Administered 2022-10-17: 510 mg via INTRAVENOUS
  Filled 2022-10-17: qty 510

## 2022-10-17 MED ORDER — DILTIAZEM HCL 30 MG PO TABS: 30.0000 mg | ORAL_TABLET | Freq: Two times a day (BID) | ORAL | 1 refills | Status: DC

## 2022-10-17 MED ORDER — METOPROLOL TARTRATE 50 MG PO TABS: 50.0000 mg | ORAL_TABLET | Freq: Every day | ORAL | 1 refills | Status: DC

## 2022-10-17 NOTE — Patient Instructions (Addendum)
Medication Instructions:  CHANGE Cardizem to '30mg'$  take 1 tablet twice a day  CHANGE Metoprolol to '50mg'$  Take 1 tablet once a day  TAKE Doxazosin daily at bedtime *If you need a refill on your cardiac medications before your next appointment, please call your pharmacy*   Lab Work: None ordered   Testing/Procedures: None ordered   Follow-Up: At [facility], you and your health needs are our priority.  As part of our continuing mission to provide you with exceptional heart care, we have created designated Provider Care Teams.  These Care Teams include your primary Cardiologist (physician) and Advanced Practice Providers (APPs -  Physician Assistants and Nurse Practitioners) who all work together to provide you with the care you need, when you need it.  We recommend signing up for the patient portal called "MyChart".  Sign up information is provided on this After Visit Summary.  MyChart is used to connect with patients for Virtual Visits (Telemedicine).  Patients are able to view lab/test results, encounter notes, upcoming appointments, etc.  Non-urgent messages can be sent to your provider as well.   To learn more about what you can do with MyChart, go to [email or web address].    Your next appointment:   3 month(s)  Provider:   Ambrose Pancoast, NP      Other Instructions

## 2022-10-20 DIAGNOSIS — N1832 Chronic kidney disease, stage 3b: Secondary | ICD-10-CM | POA: Diagnosis not present

## 2022-10-20 DIAGNOSIS — I25708 Atherosclerosis of coronary artery bypass graft(s), unspecified, with other forms of angina pectoris: Secondary | ICD-10-CM | POA: Diagnosis not present

## 2022-10-20 DIAGNOSIS — I7143 Infrarenal abdominal aortic aneurysm, without rupture: Secondary | ICD-10-CM | POA: Diagnosis not present

## 2022-10-20 DIAGNOSIS — N184 Chronic kidney disease, stage 4 (severe): Secondary | ICD-10-CM | POA: Diagnosis not present

## 2022-10-20 DIAGNOSIS — I129 Hypertensive chronic kidney disease with stage 1 through stage 4 chronic kidney disease, or unspecified chronic kidney disease: Secondary | ICD-10-CM | POA: Diagnosis not present

## 2022-10-20 DIAGNOSIS — D62 Acute posthemorrhagic anemia: Secondary | ICD-10-CM | POA: Diagnosis not present

## 2022-10-20 DIAGNOSIS — I951 Orthostatic hypotension: Secondary | ICD-10-CM | POA: Diagnosis not present

## 2022-10-20 DIAGNOSIS — D631 Anemia in chronic kidney disease: Secondary | ICD-10-CM | POA: Diagnosis not present

## 2022-10-20 DIAGNOSIS — Z48815 Encounter for surgical aftercare following surgery on the digestive system: Secondary | ICD-10-CM | POA: Diagnosis not present

## 2022-10-20 DIAGNOSIS — I480 Paroxysmal atrial fibrillation: Secondary | ICD-10-CM | POA: Diagnosis not present

## 2022-10-21 DIAGNOSIS — I25708 Atherosclerosis of coronary artery bypass graft(s), unspecified, with other forms of angina pectoris: Secondary | ICD-10-CM | POA: Diagnosis not present

## 2022-10-21 DIAGNOSIS — D62 Acute posthemorrhagic anemia: Secondary | ICD-10-CM | POA: Diagnosis not present

## 2022-10-21 DIAGNOSIS — Z48815 Encounter for surgical aftercare following surgery on the digestive system: Secondary | ICD-10-CM | POA: Diagnosis not present

## 2022-10-21 DIAGNOSIS — I129 Hypertensive chronic kidney disease with stage 1 through stage 4 chronic kidney disease, or unspecified chronic kidney disease: Secondary | ICD-10-CM | POA: Diagnosis not present

## 2022-10-21 DIAGNOSIS — I7143 Infrarenal abdominal aortic aneurysm, without rupture: Secondary | ICD-10-CM | POA: Diagnosis not present

## 2022-10-21 DIAGNOSIS — I951 Orthostatic hypotension: Secondary | ICD-10-CM | POA: Diagnosis not present

## 2022-10-21 DIAGNOSIS — D631 Anemia in chronic kidney disease: Secondary | ICD-10-CM | POA: Diagnosis not present

## 2022-10-21 DIAGNOSIS — I480 Paroxysmal atrial fibrillation: Secondary | ICD-10-CM | POA: Diagnosis not present

## 2022-10-21 DIAGNOSIS — N184 Chronic kidney disease, stage 4 (severe): Secondary | ICD-10-CM | POA: Diagnosis not present

## 2022-10-23 DIAGNOSIS — D631 Anemia in chronic kidney disease: Secondary | ICD-10-CM | POA: Diagnosis not present

## 2022-10-23 DIAGNOSIS — D62 Acute posthemorrhagic anemia: Secondary | ICD-10-CM | POA: Diagnosis not present

## 2022-10-23 DIAGNOSIS — I7143 Infrarenal abdominal aortic aneurysm, without rupture: Secondary | ICD-10-CM | POA: Diagnosis not present

## 2022-10-23 DIAGNOSIS — N184 Chronic kidney disease, stage 4 (severe): Secondary | ICD-10-CM | POA: Diagnosis not present

## 2022-10-23 DIAGNOSIS — I25708 Atherosclerosis of coronary artery bypass graft(s), unspecified, with other forms of angina pectoris: Secondary | ICD-10-CM | POA: Diagnosis not present

## 2022-10-23 DIAGNOSIS — I951 Orthostatic hypotension: Secondary | ICD-10-CM | POA: Diagnosis not present

## 2022-10-23 DIAGNOSIS — I129 Hypertensive chronic kidney disease with stage 1 through stage 4 chronic kidney disease, or unspecified chronic kidney disease: Secondary | ICD-10-CM | POA: Diagnosis not present

## 2022-10-23 DIAGNOSIS — Z48815 Encounter for surgical aftercare following surgery on the digestive system: Secondary | ICD-10-CM | POA: Diagnosis not present

## 2022-10-23 DIAGNOSIS — I480 Paroxysmal atrial fibrillation: Secondary | ICD-10-CM | POA: Diagnosis not present

## 2022-10-27 ENCOUNTER — Encounter (HOSPITAL_COMMUNITY)
Admission: RE | Admit: 2022-10-27 | Discharge: 2022-10-27 | Disposition: A | Discharge status: Home / Self Care | Payer: Medicare PPO | Source: Ambulatory Visit | Attending: Nephrology | Admitting: Nephrology

## 2022-10-27 DIAGNOSIS — N189 Chronic kidney disease, unspecified: Secondary | ICD-10-CM | POA: Diagnosis not present

## 2022-10-27 DIAGNOSIS — D631 Anemia in chronic kidney disease: Secondary | ICD-10-CM | POA: Diagnosis not present

## 2022-10-27 MED ORDER — SODIUM CHLORIDE 0.9 % IV SOLN
510.0000 mg | INTRAVENOUS | Status: DC
  Administered 2022-10-27: 510 mg via INTRAVENOUS
  Filled 2022-10-27: qty 17

## 2022-10-28 DIAGNOSIS — D62 Acute posthemorrhagic anemia: Secondary | ICD-10-CM | POA: Diagnosis not present

## 2022-10-28 DIAGNOSIS — I480 Paroxysmal atrial fibrillation: Secondary | ICD-10-CM | POA: Diagnosis not present

## 2022-10-28 DIAGNOSIS — I25708 Atherosclerosis of coronary artery bypass graft(s), unspecified, with other forms of angina pectoris: Secondary | ICD-10-CM | POA: Diagnosis not present

## 2022-10-28 DIAGNOSIS — D631 Anemia in chronic kidney disease: Secondary | ICD-10-CM | POA: Diagnosis not present

## 2022-10-28 DIAGNOSIS — N184 Chronic kidney disease, stage 4 (severe): Secondary | ICD-10-CM | POA: Diagnosis not present

## 2022-10-28 DIAGNOSIS — I7143 Infrarenal abdominal aortic aneurysm, without rupture: Secondary | ICD-10-CM | POA: Diagnosis not present

## 2022-10-28 DIAGNOSIS — I129 Hypertensive chronic kidney disease with stage 1 through stage 4 chronic kidney disease, or unspecified chronic kidney disease: Secondary | ICD-10-CM | POA: Diagnosis not present

## 2022-10-28 DIAGNOSIS — Z48815 Encounter for surgical aftercare following surgery on the digestive system: Secondary | ICD-10-CM | POA: Diagnosis not present

## 2022-10-28 DIAGNOSIS — I951 Orthostatic hypotension: Secondary | ICD-10-CM | POA: Diagnosis not present

## 2022-10-29 DIAGNOSIS — E538 Deficiency of other specified B group vitamins: Secondary | ICD-10-CM | POA: Diagnosis not present

## 2022-10-30 MED ORDER — DILTIAZEM HCL 30 MG PO TABS: 30.0000 mg | ORAL_TABLET | Freq: Two times a day (BID) | ORAL | Status: DC | PRN

## 2022-11-03 ENCOUNTER — Telehealth: Payer: Self-pay | Admitting: Cardiology

## 2022-11-03 NOTE — Telephone Encounter (Signed)
Spoke with Clair Gulling at [facility] who called to make [place] aware of patient's two falls last week. MyChart messages were sent in regards to dizzy spell and fall on 2/1. Patient was advised to stop cardizem daily and take only as needed.  I called the patient to discuss further. He states that on Friday his fall was due to a trip and not his blood pressure. He states that his feet got caught when he was trying to make a 90 degree turn and he lost balance and fell onto a big pack of toilet paper which broke his fall. He did not have any injuries nor did he hit his head. He reports that he was not dizzy or lightheaded prior to his fall on Friday. He states that he did have some lightheadedness and dizziness over the weekend. Typically occurs when he changes positions. He is aware to do so slowly. Reports that yesterday BP sitting was 113/59 and standing 77/47. He is wearing his compression stockings during the day and is making sure he is staying hydrated.

## 2022-11-03 NOTE — Telephone Encounter (Signed)
Caller wanted to report patient said on 2/1 that he had a fall onto his bed and on 2/2 he had a fall onto floor with no injuries.  Caller stated patient's BP was 117/59 (seated) and 77/49 (standing) and patient also reported lightheadedness/dizziness when standing.

## 2022-11-04 DIAGNOSIS — I129 Hypertensive chronic kidney disease with stage 1 through stage 4 chronic kidney disease, or unspecified chronic kidney disease: Secondary | ICD-10-CM | POA: Diagnosis not present

## 2022-11-04 DIAGNOSIS — I951 Orthostatic hypotension: Secondary | ICD-10-CM | POA: Diagnosis not present

## 2022-11-04 DIAGNOSIS — Z48815 Encounter for surgical aftercare following surgery on the digestive system: Secondary | ICD-10-CM | POA: Diagnosis not present

## 2022-11-04 DIAGNOSIS — I25708 Atherosclerosis of coronary artery bypass graft(s), unspecified, with other forms of angina pectoris: Secondary | ICD-10-CM | POA: Diagnosis not present

## 2022-11-04 DIAGNOSIS — D631 Anemia in chronic kidney disease: Secondary | ICD-10-CM | POA: Diagnosis not present

## 2022-11-04 DIAGNOSIS — I7143 Infrarenal abdominal aortic aneurysm, without rupture: Secondary | ICD-10-CM | POA: Diagnosis not present

## 2022-11-04 DIAGNOSIS — N184 Chronic kidney disease, stage 4 (severe): Secondary | ICD-10-CM | POA: Diagnosis not present

## 2022-11-04 DIAGNOSIS — D62 Acute posthemorrhagic anemia: Secondary | ICD-10-CM | POA: Diagnosis not present

## 2022-11-04 DIAGNOSIS — I480 Paroxysmal atrial fibrillation: Secondary | ICD-10-CM | POA: Diagnosis not present

## 2022-11-05 ENCOUNTER — Telehealth: Payer: Self-pay | Admitting: Cardiology

## 2022-11-05 DIAGNOSIS — N1832 Chronic kidney disease, stage 3b: Secondary | ICD-10-CM | POA: Diagnosis not present

## 2022-11-05 DIAGNOSIS — I129 Hypertensive chronic kidney disease with stage 1 through stage 4 chronic kidney disease, or unspecified chronic kidney disease: Secondary | ICD-10-CM | POA: Diagnosis not present

## 2022-11-05 DIAGNOSIS — Z48815 Encounter for surgical aftercare following surgery on the digestive system: Secondary | ICD-10-CM | POA: Diagnosis not present

## 2022-11-05 DIAGNOSIS — I951 Orthostatic hypotension: Secondary | ICD-10-CM | POA: Diagnosis not present

## 2022-11-05 DIAGNOSIS — I7143 Infrarenal abdominal aortic aneurysm, without rupture: Secondary | ICD-10-CM | POA: Diagnosis not present

## 2022-11-05 DIAGNOSIS — N184 Chronic kidney disease, stage 4 (severe): Secondary | ICD-10-CM | POA: Diagnosis not present

## 2022-11-05 DIAGNOSIS — I25708 Atherosclerosis of coronary artery bypass graft(s), unspecified, with other forms of angina pectoris: Secondary | ICD-10-CM | POA: Diagnosis not present

## 2022-11-05 DIAGNOSIS — I480 Paroxysmal atrial fibrillation: Secondary | ICD-10-CM | POA: Diagnosis not present

## 2022-11-05 DIAGNOSIS — D62 Acute posthemorrhagic anemia: Secondary | ICD-10-CM | POA: Diagnosis not present

## 2022-11-05 DIAGNOSIS — D631 Anemia in chronic kidney disease: Secondary | ICD-10-CM | POA: Diagnosis not present

## 2022-11-05 NOTE — Telephone Encounter (Signed)
Returned call to [employer] with [facility].  Langley Gauss reports she saw patient this morning and he reported speaking with PCP Dr. Vanessa La Carla yesterday about orthostasis and dizziness continuing. Denise states Dr. Vanessa Rake has decreased metoprolol tartrate to '25mg'$  daily.  BP this morning (2 hours after taking medication): 138/68, HR 62 (regular) sitting 124/56 standing  Langley Gauss states patient did report "a little dizziness" when standing but not as bad as before dose change.  Will forward to Ambrose Pancoast, NP to review.  Med list updated.

## 2022-11-05 NOTE — Telephone Encounter (Signed)
Pt c/o medication issue:  1. Name of Medication: metoprolol tartrate (LOPRESSOR) 50 MG tablet   2. How are you currently taking this medication (dosage and times per day)? 25 mg 1 x daily   3. Are you having a reaction (difficulty breathing--STAT)? No  4. What is your medication issue? [place] is calling to let cardiologist know that pt's PCP has changed this medication from 50 mg to 25 mg 1 x daily due to BP readings.  Started change this morning.

## 2022-11-06 ENCOUNTER — Encounter (HOSPITAL_COMMUNITY): Payer: Self-pay | Admitting: *Deleted

## 2022-11-07 DIAGNOSIS — I480 Paroxysmal atrial fibrillation: Secondary | ICD-10-CM | POA: Diagnosis not present

## 2022-11-07 DIAGNOSIS — N184 Chronic kidney disease, stage 4 (severe): Secondary | ICD-10-CM | POA: Diagnosis not present

## 2022-11-07 DIAGNOSIS — I951 Orthostatic hypotension: Secondary | ICD-10-CM | POA: Diagnosis not present

## 2022-11-07 DIAGNOSIS — D631 Anemia in chronic kidney disease: Secondary | ICD-10-CM | POA: Diagnosis not present

## 2022-11-07 DIAGNOSIS — Z48815 Encounter for surgical aftercare following surgery on the digestive system: Secondary | ICD-10-CM | POA: Diagnosis not present

## 2022-11-07 DIAGNOSIS — I7143 Infrarenal abdominal aortic aneurysm, without rupture: Secondary | ICD-10-CM | POA: Diagnosis not present

## 2022-11-07 DIAGNOSIS — I129 Hypertensive chronic kidney disease with stage 1 through stage 4 chronic kidney disease, or unspecified chronic kidney disease: Secondary | ICD-10-CM | POA: Diagnosis not present

## 2022-11-07 DIAGNOSIS — D62 Acute posthemorrhagic anemia: Secondary | ICD-10-CM | POA: Diagnosis not present

## 2022-11-07 DIAGNOSIS — I25708 Atherosclerosis of coronary artery bypass graft(s), unspecified, with other forms of angina pectoris: Secondary | ICD-10-CM | POA: Diagnosis not present

## 2022-11-11 DIAGNOSIS — N184 Chronic kidney disease, stage 4 (severe): Secondary | ICD-10-CM | POA: Diagnosis not present

## 2022-11-11 DIAGNOSIS — I480 Paroxysmal atrial fibrillation: Secondary | ICD-10-CM | POA: Diagnosis not present

## 2022-11-11 DIAGNOSIS — Z48815 Encounter for surgical aftercare following surgery on the digestive system: Secondary | ICD-10-CM | POA: Diagnosis not present

## 2022-11-11 DIAGNOSIS — D631 Anemia in chronic kidney disease: Secondary | ICD-10-CM | POA: Diagnosis not present

## 2022-11-11 DIAGNOSIS — I25708 Atherosclerosis of coronary artery bypass graft(s), unspecified, with other forms of angina pectoris: Secondary | ICD-10-CM | POA: Diagnosis not present

## 2022-11-11 DIAGNOSIS — I7143 Infrarenal abdominal aortic aneurysm, without rupture: Secondary | ICD-10-CM | POA: Diagnosis not present

## 2022-11-11 DIAGNOSIS — I129 Hypertensive chronic kidney disease with stage 1 through stage 4 chronic kidney disease, or unspecified chronic kidney disease: Secondary | ICD-10-CM | POA: Diagnosis not present

## 2022-11-11 DIAGNOSIS — I951 Orthostatic hypotension: Secondary | ICD-10-CM | POA: Diagnosis not present

## 2022-11-11 DIAGNOSIS — D62 Acute posthemorrhagic anemia: Secondary | ICD-10-CM | POA: Diagnosis not present

## 2022-11-12 DIAGNOSIS — I129 Hypertensive chronic kidney disease with stage 1 through stage 4 chronic kidney disease, or unspecified chronic kidney disease: Secondary | ICD-10-CM | POA: Diagnosis not present

## 2022-11-12 DIAGNOSIS — N184 Chronic kidney disease, stage 4 (severe): Secondary | ICD-10-CM | POA: Diagnosis not present

## 2022-11-12 DIAGNOSIS — I480 Paroxysmal atrial fibrillation: Secondary | ICD-10-CM | POA: Diagnosis not present

## 2022-11-12 DIAGNOSIS — Z48815 Encounter for surgical aftercare following surgery on the digestive system: Secondary | ICD-10-CM | POA: Diagnosis not present

## 2022-11-12 DIAGNOSIS — I25708 Atherosclerosis of coronary artery bypass graft(s), unspecified, with other forms of angina pectoris: Secondary | ICD-10-CM | POA: Diagnosis not present

## 2022-11-12 DIAGNOSIS — D631 Anemia in chronic kidney disease: Secondary | ICD-10-CM | POA: Diagnosis not present

## 2022-11-12 DIAGNOSIS — I7143 Infrarenal abdominal aortic aneurysm, without rupture: Secondary | ICD-10-CM | POA: Diagnosis not present

## 2022-11-12 DIAGNOSIS — D62 Acute posthemorrhagic anemia: Secondary | ICD-10-CM | POA: Diagnosis not present

## 2022-11-12 DIAGNOSIS — I951 Orthostatic hypotension: Secondary | ICD-10-CM | POA: Diagnosis not present

## 2022-11-17 ENCOUNTER — Encounter: Payer: Self-pay | Admitting: Cardiology

## 2022-11-17 DIAGNOSIS — I10 Essential (primary) hypertension: Secondary | ICD-10-CM

## 2022-11-17 MED ORDER — METOPROLOL TARTRATE 12.5 MG HALF TABLET: 12.5000 mg | ORAL_TABLET | Freq: Once | ORAL | Status: DC

## 2022-11-17 NOTE — Telephone Encounter (Signed)
Sent over patient blood pressure readings to Margarite Gouge, NP. APP advised patient to start taking 12.5 mg metoprolol daily. Patient does have scheduled appointment with NP on 2/27. Explained change in medication and verified pharmacy. Patient voiced understanding.

## 2022-11-18 DIAGNOSIS — D62 Acute posthemorrhagic anemia: Secondary | ICD-10-CM | POA: Diagnosis not present

## 2022-11-18 DIAGNOSIS — I951 Orthostatic hypotension: Secondary | ICD-10-CM | POA: Diagnosis not present

## 2022-11-18 DIAGNOSIS — I25708 Atherosclerosis of coronary artery bypass graft(s), unspecified, with other forms of angina pectoris: Secondary | ICD-10-CM | POA: Diagnosis not present

## 2022-11-18 DIAGNOSIS — D631 Anemia in chronic kidney disease: Secondary | ICD-10-CM | POA: Diagnosis not present

## 2022-11-18 DIAGNOSIS — I129 Hypertensive chronic kidney disease with stage 1 through stage 4 chronic kidney disease, or unspecified chronic kidney disease: Secondary | ICD-10-CM | POA: Diagnosis not present

## 2022-11-18 DIAGNOSIS — I7143 Infrarenal abdominal aortic aneurysm, without rupture: Secondary | ICD-10-CM | POA: Diagnosis not present

## 2022-11-18 DIAGNOSIS — I480 Paroxysmal atrial fibrillation: Secondary | ICD-10-CM | POA: Diagnosis not present

## 2022-11-18 DIAGNOSIS — Z48815 Encounter for surgical aftercare following surgery on the digestive system: Secondary | ICD-10-CM | POA: Diagnosis not present

## 2022-11-18 DIAGNOSIS — N184 Chronic kidney disease, stage 4 (severe): Secondary | ICD-10-CM | POA: Diagnosis not present

## 2022-11-18 MED ORDER — METOPROLOL TARTRATE 25 MG PO TABS: 12.5000 mg | ORAL_TABLET | Freq: Every day | ORAL | 3 refills | Status: DC

## 2022-11-19 DIAGNOSIS — N184 Chronic kidney disease, stage 4 (severe): Secondary | ICD-10-CM | POA: Diagnosis not present

## 2022-11-19 DIAGNOSIS — I480 Paroxysmal atrial fibrillation: Secondary | ICD-10-CM | POA: Diagnosis not present

## 2022-11-19 DIAGNOSIS — I951 Orthostatic hypotension: Secondary | ICD-10-CM | POA: Diagnosis not present

## 2022-11-19 DIAGNOSIS — I129 Hypertensive chronic kidney disease with stage 1 through stage 4 chronic kidney disease, or unspecified chronic kidney disease: Secondary | ICD-10-CM | POA: Diagnosis not present

## 2022-11-19 DIAGNOSIS — D631 Anemia in chronic kidney disease: Secondary | ICD-10-CM | POA: Diagnosis not present

## 2022-11-19 DIAGNOSIS — I7143 Infrarenal abdominal aortic aneurysm, without rupture: Secondary | ICD-10-CM | POA: Diagnosis not present

## 2022-11-19 DIAGNOSIS — D62 Acute posthemorrhagic anemia: Secondary | ICD-10-CM | POA: Diagnosis not present

## 2022-11-19 DIAGNOSIS — Z48815 Encounter for surgical aftercare following surgery on the digestive system: Secondary | ICD-10-CM | POA: Diagnosis not present

## 2022-11-19 DIAGNOSIS — I25708 Atherosclerosis of coronary artery bypass graft(s), unspecified, with other forms of angina pectoris: Secondary | ICD-10-CM | POA: Diagnosis not present

## 2022-11-20 DIAGNOSIS — H60331 Swimmer's ear, right ear: Secondary | ICD-10-CM | POA: Diagnosis not present

## 2022-11-20 DIAGNOSIS — H903 Sensorineural hearing loss, bilateral: Secondary | ICD-10-CM | POA: Diagnosis not present

## 2022-11-24 NOTE — Progress Notes (Deleted)
Office Visit    Patient Name: Erik Perez Date of Encounter: 11/24/2022  Primary Care Provider:  Lurline Del, DO Primary Cardiologist:  Erik Grooms, MD (Inactive) Primary Electrophysiologist: Erik Meredith Leeds, MD  Chief Complaint    Erik Perez is a 77 y.o. male with PMH of new onset atrial fibrillation (on Eliquis) CAD s/p CABG (LIMA LAD; RIMA RCA, 2009) and Taxus stent in the native circumflex, OSA on CPAP,  prostate CA, AAA, hyperlipidemia, and bilateral carotid disease, HTN who presents today for 35-monthfollow-up and medication management.  Past Medical History    Past Medical History:  Diagnosis Date   Carotid artery obstruction    Bilateral moderate carotid obstruction 2013 doppler   Coronary artery disease    Hyperlipidemia    Hypertension    OSA (obstructive sleep apnea)    Split 02-08-07 AHI total 32/hr; REM 60/hr O2 sat min NREM 88% REM 88%   Past Surgical History:  Procedure Laterality Date   CERVICAL DISC SURGERY  2009   CHOLECYSTECTOMY N/A 09/23/2022   Procedure: LAPAROSCOPIC CHOLECYSTECTOMY;  Surgeon: SFelicie Morn MD;  [facility]  Service: General;  Laterality: N/A;   CGroom to LAD, RIMA to RCA 2000,Cfx. Taxus stent 2002    Allergies  Allergies  Allergen Reactions   Colchicine Other (See Comments)   Diclofenac Sodium     Chest Pain   Gabapentin     Increased sedation    Niaspan [Niacin Er]     Involuntary facial flushing    History of Present Illness    Erik Perez is a 77year old male with the above mention past medical history who presents today for follow-up of atrial fibrillation.  Mr. YCari Carawayhas been followed by Dr. STamala Juliansince 2015 for management of coronary artery disease.  He has a history of CABG in 2000 and Taxus stent placed to the circumflex in 2009.  Most recent 2D echo was completed 03/2022 showing EF of 60-65%, no RWMA, normal RV systolic function  with no evidence of MV or AV regurgitation and no LVH.   He was last seen by Dr. STamala Julianon 07/2021 and was doing well with no cardiac complaints. [name] admitted on 07/10/2022 with new onset atrial fibrillation.  High-sensitivity troponins were negative x2 patient's creatinine was 1.95 with normal thyroid studies.  He was started on Eliquis 5 mg twice daily with plan to undergo DCCV in 3 weeks.  He was started on metoprolol for rate control.  He was seen by RAdline Peals PA on 07/17/2022 and had converted to sinus rhythm spontaneously.  Required no DCCV and discussed the possibility of wearing event monitor to quantify AF burden.  He was seen by me 08/14/2022 for complaint of new onset AF.  During visit patient endorsed increased fatigue and blood pressure was noted to be 134/70.  He he was feeling well during office visit and heart rate was 52 bpm.  He noticed some increased fatigue with metoprolol 100 mg and this was reduced to 75 mg twice daily.  He was given a 30-day event monitor to assess AF burden.  He also endorsed chest discomfort with ambulation and Lexiscan Myoview was ordered for further evaluation.  Blood pressure was noted to be also slightly elevated and HCTZ was increased to 37.5 mg.  He was seen in the ED 08/20/2021 after going into AF with RVR and was cardioverted  to sinus rhythm.  He was started on amiodarone due to CKD.  He was seen by Erik Perez on 12/1 in the AF clinic for further evaluation.  They discussed the possibility of possible AF ablation candidacy after maintaining rate control on amiodarone.  He was referred to EP for further evaluation.  He was seen in office on 09/04/2022 for follow-up and endorsed occasional bouts of shortness of breath and dizziness since starting amiodarone.  He endorsed some increased fatigue and metoprolol was decreased to 75 mg twice daily.  He was maintaining sinus rhythm with a rate of 50 bpm.  He endorsed some chest discomfort with walking and  Lexiscan Myoview was ordered that revealed no evidence of ischemia and was low risk.  He presented to the ED and was admitted 09/12/2022 with complaint of mild shortness of breath and fatigue and was diagnosed with COVID and started on molnupiravir.  He presented again to the ED 09/17/2022 with complaint of abdominal pain with right-sided radiation to right shoulder.  He was found to have acute cholecystitis and underwent lap chole on 12/26.  He had follow-up with general surgery 10/02/2022.  Patient was seen on 10/17/2022 for follow-up.  During visit patient reported a fall that occurred where he hit his knees but did not strike his head.  He also endorsed a bout of AF with RVR that resolved with as needed Cardizem.  We decreased his metoprolol and Cardura during visit to offset orthostatic hypotension.   Since last being seen in the office patient reports***.  Patient denies chest pain, palpitations, dyspnea, PND, orthopnea, nausea, vomiting, dizziness, syncope, edema, weight gain, or early satiety.     ***Notes:  Home Medications    Current Outpatient Medications  Medication Sig Dispense Refill   acetaminophen (TYLENOL) 325 MG tablet Take 2 tablets (650 mg total) by mouth every 6 (six) hours as needed for mild pain (or temp > 100).     allopurinol (ZYLOPRIM) 100 MG tablet Take 100 mg by mouth 2 (two) times daily.     amiodarone (PACERONE) 200 MG tablet Take 200 mg by mouth daily. Through Dec. 15th and then goes to once daily     apixaban (ELIQUIS) 5 MG TABS tablet Take 1 tablet (5 mg total) by mouth 2 (two) times daily. 60 tablet 3   atorvastatin (LIPITOR) 40 MG tablet Take 40 mg by mouth daily.     calcium gluconate 500 MG tablet Take 1 tablet by mouth 3 (three) times daily.     Cholecalciferol (VITAMIN D) 2000 UNITS tablet Take 2,000 Units by mouth 2 (two) times daily.      Cyanocobalamin (B-12 IJ) Take one (1) injection into the muscle once a month.     diltiazem (CARDIZEM) 30 MG tablet Take  1 tablet (30 mg total) by mouth 2 (two) times daily as needed (for heart rate greater than 110).     doxazosin (CARDURA) 2 MG tablet Take 1 tablet (2 mg total) by mouth daily. 90 tablet 1   fexofenadine (ALLEGRA) 180 MG tablet Take 180 mg by mouth daily.     furosemide (LASIX) 20 MG tablet Take 20 mg oral daily for next 5 days, then as needed for edema or fluid retention 30 tablet 0   metoprolol tartrate (LOPRESSOR) 25 MG tablet Take 0.5 tablets (12.5 mg total) by mouth daily. 45 tablet 3   Multiple Vitamin (MULTIVITAMIN) tablet Take 1 tablet by mouth daily.     nitroGLYCERIN (NITROSTAT) 0.4 MG SL tablet  Place 1 tablet (0.4 mg total) under the tongue every 5 (five) minutes as needed for chest pain. 25 tablet 1   Omega-3 Fatty Acids (FISH OIL) 1200 MG CAPS Take 1,200 mg by mouth 2 (two) times daily.     sodium bicarbonate 650 MG tablet Take 1 tablet (650 mg total) by mouth 2 (two) times daily. 60 tablet 0   No current facility-administered medications for this visit.     Review of Systems  Please see the history of present illness.    (+)*** (+)***  All other systems reviewed and are otherwise negative except as noted above.  Physical Exam    Wt Readings from Last 3 Encounters:  10/27/22 205 lb (93 kg)  10/17/22 205 lb 6.4 oz (93.2 kg)  10/02/22 217 lb (98.4 kg)   [id number] were no vitals filed for this visit.,There is no height or weight on file to calculate BMI.  Constitutional:      Appearance: Healthy appearance. Not in distress.  Neck:     Vascular: JVD normal.  Pulmonary:     Effort: Pulmonary effort is normal.     Breath sounds: No wheezing. No rales. Diminished in the bases Cardiovascular:     Normal rate. Regular rhythm. Normal S1. Normal S2.      Murmurs: There is no murmur.  Edema:    Peripheral edema absent.  Abdominal:     Palpations: Abdomen is soft non tender. There is no hepatomegaly.  Skin:    General: Skin is warm and dry.  Neurological:     General: No  focal deficit present.     Mental Status: Alert and oriented to person, place and time.     Cranial Nerves: Cranial nerves are intact.  EKG/LABS/Other Studies Reviewed    ECG personally reviewed by me today - ***  Risk Assessment/Calculations:   {Does this patient have ATRIAL FIBRILLATION?:[phone]}        Lab Results  Component Value Date   WBC 6.5 09/29/2022   HGB 8.5 (L) 09/29/2022   HCT 25.0 (L) 09/29/2022   MCV 96.5 09/29/2022   PLT 195 09/29/2022   Lab Results  Component Value Date   CREATININE 3.52 (H) 09/29/2022   BUN 48 (H) 09/29/2022   NA 139 09/29/2022   K 3.6 09/29/2022   CL 104 09/29/2022   CO2 21 (L) 09/29/2022   Lab Results  Component Value Date   ALT 38 09/27/2022   AST 31 09/27/2022   ALKPHOS 30 (L) 09/27/2022   BILITOT 1.0 09/27/2022   Lab Results  Component Value Date   CHOL 123 05/22/2014   HDL 28.60 (L) 05/22/2014   LDLCALC 67 05/22/2014   TRIG 135.0 05/22/2014   CHOLHDL 4 05/22/2014    No results found for: "HGBA1C"  Assessment & Plan    1.  Paroxysmal atrial fibrillation: -New onset AF diagnosed on 07/10/2022 with rate control initiated with amiodarone 200 mg twice daily with dose changing to 200 mg -Continue Eliquis 5 mg twice daily.  His last creatinine was 1.95 and we Erik recheck BMET today to monitor correct dose. -We Erik continue Cardizem 30 mg twice daily and we Erik decrease the metoprolol to 50 mg daily -He was advised to continue as needed Cardizem 30 mg for elevated heart rate greater than 100 bpm. -CHA2DS2-VASc Score = 4 [CHF History: 0, HTN History: 1, Diabetes History: 0, Stroke History: 0, Vascular Disease History: 1, Age Score: 2, Gender Score: 0].  Therefore, the patient's annual  risk of stroke is 4.8 %.       2.  Coronary artery disease: -s/p CABG 2000 with PCI andT axus stent in the native circumflex in 2009. -Today patient reports no chest pain with ambulation or physical activity. -Continue GDMT with  atorvastatin 40 mg, metoprolol 50 mg -Lexiscan results revealed no evidence of ischemia    3.  Essential hypertension: -Blood pressure today is well-controlled at 118/62 -We Erik decrease Cardura to 2 mg at bedtime to decrease symptomatic orthostasis -Continue metoprolol 50 mg daily   4.  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 Hyperlipidemia: -Patient's last LDL cholesterol was 64 in 2020.  We Erik contact PCP for most recent readings. -Continue statin therapy as noted above        Disposition: Follow-up with Erik Grooms, MD (Inactive) or APP in *** months {Are you ordering a CV Procedure (e.g. stress test, cath, DCCV, TEE, etc)?   Press F2        :[id number]   Medication Adjustments/Labs and Tests Ordered: Current medicines are reviewed at length with the patient today.  Concerns regarding medicines are outlined above.   Signed, Mable Fill, Marissa Nestle, NP 11/24/2022, 7:39 AM [facility]  Note:  This document was prepared using Dragon voice recognition software and may include unintentional dictation errors.

## 2022-11-24 NOTE — Progress Notes (Unsigned)
Cardiology Office Note:    Date:  11/25/2022   ID:  Erik Perez, DOB 12/02/1945, MRN [id number]  PCP:  Lurline Del, [place] Prov[facility] Cardiologist:  Lenna Sciara, MD Referring MD: Lurline Del, DO   Chief Complaint/Reason for Referral:  General cardiology follow up  ASSESSMENT:    1. S/P CABG x 2   2. Primary hypertension   3. Hyperlipidemia LDL goal <70   4. PAF (paroxysmal atrial fibrillation) ([place])   5. Aortic atherosclerosis ([place])   6. CKD (chronic kidney disease) stage 4, GFR 15-29 ml/min (HCC)     PLAN:    In order of problems listed above: Coronary artery disease: He is having no signs or symptoms of angina.  Continue metoprolol, Eliquis in lieu of aspirin, and atorvastatin. Hypertension: Will change Lopressor 12.5 mg daily to to Toprol 25 mg and have the patient take this at bedtime.  Will refer to pharmacy for help with blood pressure management Hyperlipidemia: Will check lipid panel, LFTs, and LP(a) today Paroxysmal atrial fibrillation: Continue amiodarone, Eliquis, and metoprolol.  The patient is followed by atrial fibrillation clinic with a planned ablation in the coming months.  He looks to be in normal sinus rhythm today. Aortic atherosclerosis: Continue Eliquis and lieu of aspirin, atorvastatin, and strict blood pressure control. Chronic kidney disease: Avoid nephrotoxic agents.  I have asked him to be careful about his as needed Lasix.  He follows closely with [facility].             Dispo:  Return in about 6 months (around 05/26/2023).      Medication Adjustments/Labs and Tests Ordered: Current medicines are reviewed at length with the patient today.  Concerns regarding medicines are outlined above.  The following changes have been made:     Labs/tests ordered: Orders Placed This Encounter  Procedures   Lipoprotein A (LPA)   Lipid panel   Hepatic function panel   AMB Referral to [facility]    Medication  Changes: Meds ordered this encounter  Medications   metoprolol succinate (TOPROL XL) 25 MG 24 hr tablet    Sig: Take 1 tablet (25 mg total) by mouth at bedtime.    Dispense:  90 tablet    Refill:  3    Change from Lopressor     Current medicines are reviewed at length with the patient today.  The patient does not have concerns regarding medicines.   History of Present Illness:    FOCUSED PROBLEM LIST:   1.  Coronary artery disease status post CABG consisting of a LIMA to LAD and RIMA to right coronary artery in 2000; status post PCI of left circumflex 2001 2.  Hypertension 3.  Hyperlipidemia 4.  Paroxysmal atrial fibrillation on Eliquis: CV2 score of 4 5.  Aortic atherosclerosis on chest CT 2023 6.  CKD stage IV   The patient is a 77 y.o. male with the indicated medical history here for medical follow-up.  The patient was started on Eliquis for new onset atrial fibrillation in October.  He spontaneously converted a few weeks after this.  He developed acute cholecystitis at the end of December and underwent a laparoscopic cholecystectomy without issues.  He was seen in the [facility] cardiology division for outpatient follow-up in January.  At that visit he was doing well.  His medical regimen was continued aside from decreasing metoprolol to 50 mg a day due to a bout of dizziness.  He continued to have dizziness so  his metoprolol was decreased to 12.5 mg a day.  The patient is here with his family.  He denies any exertional angina or dyspnea.  He tells me his lightheadedness is much improved.  His blood pressure log that I reviewed today demonstrates blood pressures that are primarily in the 120s to 160s.  His heart rate has been anywhere from the 70s to 100s.  He denies any severe bleeding or bruising while on Eliquis.  He has had no signs or symptoms of stroke.  He denies any peripheral edema, or paroxysmal nocturnal dyspnea.  He has not required any emergency room visits or hospitalizations  since December.          Current Medications: Current Meds  Medication Sig   acetaminophen (TYLENOL) 325 MG tablet Take 2 tablets (650 mg total) by mouth every 6 (six) hours as needed for mild pain (or temp > 100).   allopurinol (ZYLOPRIM) 100 MG tablet Take 100 mg by mouth 2 (two) times daily.   amiodarone (PACERONE) 200 MG tablet Take 200 mg by mouth daily. Through Dec. 15th and then goes to once daily   apixaban (ELIQUIS) 5 MG TABS tablet Take 1 tablet (5 mg total) by mouth 2 (two) times daily.   atorvastatin (LIPITOR) 40 MG tablet Take 40 mg by mouth daily.   calcium gluconate 500 MG tablet Take 1 tablet by mouth 3 (three) times daily.   Cholecalciferol (VITAMIN D) 2000 UNITS tablet Take 2,000 Units by mouth 2 (two) times daily.    Cyanocobalamin (B-12 IJ) Take one (1) injection into the muscle once a month.   diltiazem (CARDIZEM) 30 MG tablet Take 1 tablet (30 mg total) by mouth 2 (two) times daily as needed (for heart rate greater than 110).   doxazosin (CARDURA) 2 MG tablet Take 1 tablet (2 mg total) by mouth daily.   fexofenadine (ALLEGRA) 180 MG tablet Take 180 mg by mouth daily.   furosemide (LASIX) 20 MG tablet Take 20 mg oral daily for next 5 days, then as needed for edema or fluid retention   metoprolol succinate (TOPROL XL) 25 MG 24 hr tablet Take 1 tablet (25 mg total) by mouth at bedtime.   Multiple Vitamin (MULTIVITAMIN) tablet Take 1 tablet by mouth daily.   nitroGLYCERIN (NITROSTAT) 0.4 MG SL tablet Place 1 tablet (0.4 mg total) under the tongue every 5 (five) minutes as needed for chest pain.   Omega-3 Fatty Acids (FISH OIL) 1200 MG CAPS Take 1,200 mg by mouth 2 (two) times daily.   [DISCONTINUED] metoprolol tartrate (LOPRESSOR) 25 MG tablet Take 0.5 tablets (12.5 mg total) by mouth daily.     Allergies:    Colchicine, Diclofenac sodium, Gabapentin, and Niaspan [niacin er]   Social History:   Social History   Tobacco Use   Smoking status: Former    Packs/day:  0.33    Years: 23.00    Total pack years: 7.59    Types: Cigarettes    Start date: 1962    Quit date: 1985    Years since quitting: 39.1   Smokeless tobacco: Never   Tobacco comments:    Former smoker 07/17/22  Substance Use Topics   Alcohol use: Yes    Comment: social   Drug use: No     Family Hx: Family History  Problem Relation Age of Onset   Heart disease Mother    Heart disease Father    Heart disease Brother      Review of Systems:  Please see the history of present illness.    All other systems reviewed and are negative.     EKGs/Labs/Other Test Reviewed:    EKG:  EKG performed December 2023 that I personally reviewed demonstrates sinus rhythm;   Prior CV studies:  TTE 2023:  1. Left ventricular ejection fraction, by estimation, is 60 to 65%. Left  ventricular ejection fraction by 3D volume is 60 %. The left ventricle has  normal function. The left ventricle has no regional wall motion  abnormalities. Left ventricular diastolic   parameters were normal.   2. Right ventricular systolic function is normal. The right ventricular  size is normal. Tricuspid regurgitation signal is inadequate for assessing  PA pressure.   3. The mitral valve is normal in structure. No evidence of mitral valve  regurgitation. No evidence of mitral stenosis.   4. The aortic valve is tricuspid. Aortic valve regurgitation is not  visualized. Aortic valve sclerosis/calcification is present, without any  evidence of aortic stenosis.   5. The inferior vena cava is normal in size with greater than 50%  respiratory variability, suggesting right atrial pressure of 3 mmHg.   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 Findings are equivocal. The study is low risk.   No ST deviation was noted.   LV perfusion is abnormal. There is no evidence of ischemia. There is evidence of infarction. Defect 1: There is a medium defect with moderate reduction in uptake  present in the apical to mid anterior location(s) that is fixed. There is abnormal wall motion in the defect area. Consistent with infarction.   Left ventricular function is abnormal. There was a single regional abnormality. Nuclear stress EF: 57 %. The left ventricular ejection fraction is normal (55-65%). End diastolic cavity size is normal. End systolic cavity size is normal.  Other studies Reviewed: Review of the additional studies/records demonstrates: Aortic atherosclerosis on chest CT 2023  Recent Labs: 04/08/2022: NT-Pro BNP 174 07/10/2022: TSH 1.671 09/24/2022: B Natriuretic Peptide 463.9 09/26/2022: Magnesium 2.2 09/27/2022: ALT 38 09/29/2022: BUN 48; Creatinine, Ser 3.52; Hemoglobin 8.5; Platelets 195; Potassium 3.6; Sodium 139   Recent Lipid Panel Lab Results  Component Value Date/Time   CHOL 123 05/22/2014 10:21 AM   TRIG 135.0 05/22/2014 10:21 AM   HDL 28.60 (L) 05/22/2014 10:21 AM   LDLCALC 67 05/22/2014 10:21 AM    Risk Assessment/Calculations:     CHA2DS2-VASc Score = 4   This indicates a 4.8% annual risk of stroke. The patient's score is based upon: CHF History: 0 HTN History: 1 Diabetes History: 0 Stroke History: 0 Vascular Disease History: 1 Age Score: 2 Gender Score: 0         HYPERTENSION CONTROL Vitals:   11/25/22 0918 11/25/22 0948  BP: (!) 168/72 (!) 160/70    The patient's blood pressure is elevated above target today.  In order to address the patient's elevated BP: A current anti-hypertensive medication was adjusted today.       Physical Exam:    VS:  BP (!) 160/70   Pulse 69   Ht '5\' 11"'$  (1.803 m)   Wt 207 lb 6.4 oz (94.1 kg)   SpO2 98%   BMI 28.93 kg/m    Wt Readings from Last 3 Encounters:  11/25/22 207 lb 6.4 oz (94.1 kg)  10/27/22 205 lb (93 kg)  10/17/22 205 lb 6.4 oz (93.2 kg)    GENERAL:  No apparent distress, AOx3 HEENT:  No carotid bruits, +2 carotid  impulses, no scleral icterus CAR: RRR no murmurs, gallops, rubs, or  thrills RES:  Clear to auscultation bilaterally ABD:  Soft, nontender, nondistended, positive bowel sounds x 4 VASC:  +2 radial pulses, +2 carotid pulses, palpable pedal pulses NEURO:  CN 2-12 grossly intact; motor and sensory grossly intact PSYCH:  No active depression or anxiety EXT:  No edema, ecchymosis, or cyanosis  Signed, Early Osmond, MD  11/25/2022 11:07 AM    [address], [place], [place]  [address] Phone: [phone]; Fax: [phone]   Note:  This document was prepared using Dragon voice recognition software and may include unintentional dictation errors.

## 2022-11-25 ENCOUNTER — Encounter: Payer: Self-pay | Admitting: Internal Medicine

## 2022-11-25 ENCOUNTER — Ambulatory Visit: Payer: Medicare PPO | Admitting: Nurse Practitioner

## 2022-11-25 ENCOUNTER — Encounter: Payer: Medicare PPO | Attending: Nephrology | Admitting: Internal Medicine

## 2022-11-25 VITALS — BP 160/70 | HR 69 | Ht 71.0 in | Wt 207.4 lb

## 2022-11-25 DIAGNOSIS — I7143 Infrarenal abdominal aortic aneurysm, without rupture: Secondary | ICD-10-CM | POA: Diagnosis not present

## 2022-11-25 DIAGNOSIS — I480 Paroxysmal atrial fibrillation: Secondary | ICD-10-CM | POA: Diagnosis not present

## 2022-11-25 DIAGNOSIS — I951 Orthostatic hypotension: Secondary | ICD-10-CM | POA: Diagnosis not present

## 2022-11-25 DIAGNOSIS — G4733 Obstructive sleep apnea (adult) (pediatric): Secondary | ICD-10-CM | POA: Diagnosis not present

## 2022-11-25 DIAGNOSIS — E785 Hyperlipidemia, unspecified: Secondary | ICD-10-CM | POA: Diagnosis not present

## 2022-11-25 DIAGNOSIS — Z951 Presence of aortocoronary bypass graft: Secondary | ICD-10-CM | POA: Diagnosis not present

## 2022-11-25 DIAGNOSIS — I25708 Atherosclerosis of coronary artery bypass graft(s), unspecified, with other forms of angina pectoris: Secondary | ICD-10-CM | POA: Diagnosis not present

## 2022-11-25 DIAGNOSIS — N184 Chronic kidney disease, stage 4 (severe): Secondary | ICD-10-CM | POA: Diagnosis not present

## 2022-11-25 DIAGNOSIS — I129 Hypertensive chronic kidney disease with stage 1 through stage 4 chronic kidney disease, or unspecified chronic kidney disease: Secondary | ICD-10-CM | POA: Diagnosis not present

## 2022-11-25 DIAGNOSIS — D62 Acute posthemorrhagic anemia: Secondary | ICD-10-CM | POA: Diagnosis not present

## 2022-11-25 DIAGNOSIS — I700 Atherosclerosis of aorta: Secondary | ICD-10-CM

## 2022-11-25 DIAGNOSIS — I10 Essential (primary) hypertension: Secondary | ICD-10-CM | POA: Diagnosis not present

## 2022-11-25 DIAGNOSIS — Z48815 Encounter for surgical aftercare following surgery on the digestive system: Secondary | ICD-10-CM | POA: Diagnosis not present

## 2022-11-25 DIAGNOSIS — D631 Anemia in chronic kidney disease: Secondary | ICD-10-CM | POA: Diagnosis not present

## 2022-11-25 MED ORDER — METOPROLOL SUCCINATE ER 25 MG PO TB24: 25.0000 mg | ORAL_TABLET | Freq: Every day | ORAL | 3 refills | Status: DC

## 2022-11-25 NOTE — Patient Instructions (Signed)
Medication Instructions:  Your physician has recommended you make the following change in your medication:  1.) stop Lopressor 2.) start metoprolol succinate (Toprol XL) 25 mg - one tablet daily at bedtime  *If you need a refill on your cardiac medications before your next appointment, please call your pharmacy*   Lab Work: Today: lipids/liver/Lp(a)  If you have labs (blood work) drawn today and your tests are completely normal, you will receive your results only by: [place] (if you have MyChart) OR A paper copy in the mail If you have any lab test that is abnormal or we need to change your treatment, we will call you to review the results.   Testing/Procedures: none   Follow-Up: At [facility], you and your health needs are our priority.  As part of our continuing mission to provide you with exceptional heart care, we have created designated Provider Care Teams.  These Care Teams include your primary Cardiologist (physician) and Advanced Practice Providers (APPs -  Physician Assistants and Nurse Practitioners) who all work together to provide you with the care you need, when you need it.   Your next appointment:   6 month(s)  Provider:   Ambrose Pancoast, NP   You have been [facility] to [facility] Pharm[facility] for blood pressure management

## 2022-11-26 ENCOUNTER — Encounter: Payer: Self-pay | Admitting: Internal Medicine

## 2022-11-26 DIAGNOSIS — E538 Deficiency of other specified B group vitamins: Secondary | ICD-10-CM | POA: Diagnosis not present

## 2022-11-26 DIAGNOSIS — I129 Hypertensive chronic kidney disease with stage 1 through stage 4 chronic kidney disease, or unspecified chronic kidney disease: Secondary | ICD-10-CM | POA: Diagnosis not present

## 2022-11-26 DIAGNOSIS — D631 Anemia in chronic kidney disease: Secondary | ICD-10-CM | POA: Diagnosis not present

## 2022-11-26 DIAGNOSIS — N184 Chronic kidney disease, stage 4 (severe): Secondary | ICD-10-CM | POA: Diagnosis not present

## 2022-11-26 DIAGNOSIS — N2581 Secondary hyperparathyroidism of renal origin: Secondary | ICD-10-CM | POA: Diagnosis not present

## 2022-11-26 LAB — LIPID PANEL
Chol/HDL Ratio: 2.5 ratio (ref 0.0–5.0)
Cholesterol, Total: 111 mg/dL (ref 100–199)
HDL: 44 mg/dL (ref >39)
LDL Chol Calc (NIH): 50 mg/dL (ref 0–99)
Triglycerides: 84 mg/dL (ref 0–149)
VLDL Cholesterol Cal: 17 mg/dL (ref 5–40)

## 2022-11-26 LAB — HEPATIC FUNCTION PANEL
ALT: 86 IU/L — ABNORMAL HIGH (ref 0–44)
AST: 45 IU/L — ABNORMAL HIGH (ref 0–40)
Albumin: 3.9 g/dL (ref 3.8–4.8)
Alkaline Phosphatase: 65 IU/L (ref 44–121)
Bilirubin Total: 0.6 mg/dL (ref 0.0–1.2)
Bilirubin, Direct: 0.17 mg/dL (ref 0.00–0.40)
Total Protein: 5.9 g/dL — ABNORMAL LOW (ref 6.0–8.5)

## 2022-11-26 LAB — LIPOPROTEIN A (LPA): Lipoprotein (a): 122.9 nmol/L — ABNORMAL HIGH (ref <75.0)

## 2022-11-27 ENCOUNTER — Encounter: Payer: Self-pay | Admitting: Internal Medicine

## 2022-11-27 DIAGNOSIS — I25708 Atherosclerosis of coronary artery bypass graft(s), unspecified, with other forms of angina pectoris: Secondary | ICD-10-CM | POA: Diagnosis not present

## 2022-11-27 DIAGNOSIS — N184 Chronic kidney disease, stage 4 (severe): Secondary | ICD-10-CM | POA: Diagnosis not present

## 2022-11-27 DIAGNOSIS — I129 Hypertensive chronic kidney disease with stage 1 through stage 4 chronic kidney disease, or unspecified chronic kidney disease: Secondary | ICD-10-CM | POA: Diagnosis not present

## 2022-11-27 DIAGNOSIS — I951 Orthostatic hypotension: Secondary | ICD-10-CM | POA: Diagnosis not present

## 2022-11-27 DIAGNOSIS — Z48815 Encounter for surgical aftercare following surgery on the digestive system: Secondary | ICD-10-CM | POA: Diagnosis not present

## 2022-11-27 DIAGNOSIS — I7143 Infrarenal abdominal aortic aneurysm, without rupture: Secondary | ICD-10-CM | POA: Diagnosis not present

## 2022-11-27 DIAGNOSIS — D62 Acute posthemorrhagic anemia: Secondary | ICD-10-CM | POA: Diagnosis not present

## 2022-11-27 DIAGNOSIS — D631 Anemia in chronic kidney disease: Secondary | ICD-10-CM | POA: Diagnosis not present

## 2022-11-27 DIAGNOSIS — Z79899 Other long term (current) drug therapy: Secondary | ICD-10-CM

## 2022-11-27 DIAGNOSIS — R748 Abnormal levels of other serum enzymes: Secondary | ICD-10-CM

## 2022-11-27 DIAGNOSIS — I480 Paroxysmal atrial fibrillation: Secondary | ICD-10-CM | POA: Diagnosis not present

## 2022-11-27 NOTE — Telephone Encounter (Signed)
Pt has an OV with Oda Kilts scheduled on 01/26/23 will have labs at this OV.  Added LFT to be drawn with labs for Afib Ablation. Called pt advised of this.

## 2022-11-27 NOTE — Telephone Encounter (Signed)
Early Osmond, MD  P Cv Div Ch St Triage Please obtain LFTs in 2 months.  Thanks.  Sent the pt a mychart message to send [place] a good day he can come in for repeat LFTs in 2 months.   Will go ahead and place LFT order in the system and await for the pt to send [place] the lab date (week of 4/29).  Will also send this to Dr. Dara Hoyer RN to further follow-up on.

## 2022-12-11 DIAGNOSIS — N1832 Chronic kidney disease, stage 3b: Secondary | ICD-10-CM | POA: Diagnosis not present

## 2022-12-11 DIAGNOSIS — N184 Chronic kidney disease, stage 4 (severe): Secondary | ICD-10-CM | POA: Diagnosis not present

## 2022-12-22 ENCOUNTER — Other Ambulatory Visit: Payer: Self-pay | Admitting: Anesthesiology

## 2022-12-22 DIAGNOSIS — C61 Malignant neoplasm of prostate: Secondary | ICD-10-CM

## 2022-12-24 DIAGNOSIS — E538 Deficiency of other specified B group vitamins: Secondary | ICD-10-CM | POA: Diagnosis not present

## 2022-12-31 NOTE — Progress Notes (Signed)
Patient ID: Erik Perez                 DOB: 15-Oct-1945                      MRN: [id number]      HPI: Erik Perez is a 77 y.o. male referred by Dr. Lynnette Caffeyhukkani to HTN clinic. PMH is significant for HTN, CAD s/p CABG, PAF and CKD stage 4.  Seen by Dr. Lynnette Caffeyhukkani at the end of February.  Patient was concerned with increasing blood pressure and he was referred to hypertension clinic.  Patient presents today to clinic accompanied by his wife his nephrologist Dr. Zetta BillsJay Patel at [facility] resumed his lisinopril at the end of February.  Patient states that since then patient's blood pressure has been increasing.  It appears that in December patient was in the hospital and his amlodipine was stopped.  Discharge note states because he was on diltiazem.  However patient is on diltiazem IR 30 mg as needed for A-fib and has not taken in a couple months. He is on doxazosin but does not like it feels like it makes him feel off balance.  He uses a walker because his legs will sometimes give out or feels "squrily."  He has been having issues also with his ears.  Having headaches.  Not sure if his balance issues and headaches are from his ear issues or if from his high blood pressure.   Blood pressure today in clinic significantly elevated.  Home blood pressure this morning 165/87.  Home blood pressure seems to range from 150s to 160s/70s to 80s.  He brings in his Omron upper arm cuff for verification.  Home OMRON 184/79 HR 62 home 200/78 clinical manual 186/76 home 175/65 clinic automatic 190/74 clinic manual  Current HTN meds: doxazosin 2mg  daily, metoprolol succinate 25mg  daily, lisinopril 40mg  daily Previously tried: amlodipine BP goal: <130/80  Family History:  Family History  Problem Relation Age of Onset   Heart disease Mother    Heart disease Father    Heart disease Brother     Social History: very little ETOH, former smoker  Diet:   Exercise: 4-6 blocks, PT  exercises   Home BP readings:    Wt Readings from Last 3 Encounters:  11/25/22 207 lb 6.4 oz (94.1 kg)  10/27/22 205 lb (93 kg)  10/17/22 205 lb 6.4 oz (93.2 kg)   BP Readings from Last 3 Encounters:  01/01/23 (!) 200/78  11/25/22 (!) 160/70  10/27/22 (!) 152/65   Pulse Readings from Last 3 Encounters:  01/01/23 (!) 52  11/25/22 69  10/27/22 (!) 50    Renal function: CrCl cannot be calculated (Patient's most recent lab result is older than the maximum 21 days allowed.).  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     acetaminophen (TYLENOL) 325 MG tablet Take 2 tablets (650 mg total) by mouth every 6 (six) hours as needed for mild pain (or temp > 100).  allopurinol (ZYLOPRIM) 100 MG tablet Take 100 mg by mouth 2 (two) times daily.     amiodarone (PACERONE) 200 MG tablet Take 200 mg by mouth daily. Through Dec. 15th and then goes to once daily     apixaban (ELIQUIS) 5 MG TABS tablet Take 1 tablet (5 mg total) by mouth 2 (two) times daily. 60 tablet 3   atorvastatin (LIPITOR) 40 MG tablet Take 40 mg by mouth daily.     calcium gluconate 500 MG tablet Take 1 tablet by mouth 3 (three) times daily.     Cholecalciferol (VITAMIN D) 2000 UNITS tablet Take 2,000 Units by mouth 2 (two) times daily.      Cyanocobalamin (B-12 IJ) Take one (1) injection into the muscle once a month.     diltiazem (CARDIZEM) 30 MG tablet Take 1 tablet (30 mg total) by mouth 2 (two) times daily as needed (for heart rate greater than 110).     fexofenadine (ALLEGRA) 180 MG tablet Take 180 mg by mouth daily.      furosemide (LASIX) 20 MG tablet Take 20 mg oral daily for next 5 days, then as needed for edema or fluid retention 30 tablet 0   metoprolol succinate (TOPROL XL) 25 MG 24 hr tablet Take 1 tablet (25 mg total) by mouth at bedtime. 90 tablet 3   Multiple Vitamin (MULTIVITAMIN) tablet Take 1 tablet by mouth daily.     nitroGLYCERIN (NITROSTAT) 0.4 MG SL tablet Place 1 tablet (0.4 mg total) under the tongue every 5 (five) minutes as needed for chest pain. 25 tablet 1   Omega-3 Fatty Acids (FISH OIL) 1200 MG CAPS Take 1,200 mg by mouth 2 (two) times daily.     No current facility-administered medications on file prior to visit.    Allergies  Allergen Reactions   Colchicine Other (See Comments)   Diclofenac Sodium     Chest Pain   Gabapentin     Increased sedation    Niaspan [Niacin Er]     Involuntary facial flushing    Blood pressure (!) 200/78, pulse (!) 52.   Assessment/Plan:  1. Hypertension -  Essential hypertension, benign Assessment: Blood pressure significantly elevated in clinic today.  Home blood pressures slightly better.  Cuff found to be accurate I do not see any contraindication to using amlodipine with as needed diltiazem patient does not frequently need Patient would like to confirm that his nephrologist is okay with this change Patient would really like to get off of doxazosin. feels like it makes him feel unsteady on his feet and dizzy.  He does check his blood pressure both standing and sitting and on some occasions there is a 10pt difference or so between his standing and sitting blood pressure  Plan: Stop doxazosin Start amlodipine 5 mg daily Continue lisinopril 40 mg daily and metoprolol succinate 25 mg daily Patient to call me in 1 week if blood pressure does not start improving Follow-up in clinic 4/29-same day he sees Mardelle Matte I will reach out to Dr. Eliane Decree office to confirm he is okay with this plan per patient request    Thank you  Olene Floss,  Pharm.D, BCPS, CPP [facility] Division of [facility] [address], [place], [place] [address]  Phone: [phone]; Fax: [phone]

## 2023-01-01 ENCOUNTER — Ambulatory Visit: Payer: Medicare PPO | Attending: Cardiology | Admitting: Pharmacist

## 2023-01-01 ENCOUNTER — Telehealth: Payer: Self-pay | Admitting: Internal Medicine

## 2023-01-01 VITALS — BP 200/78 | HR 52

## 2023-01-01 DIAGNOSIS — I10 Essential (primary) hypertension: Secondary | ICD-10-CM

## 2023-01-01 MED ORDER — AMLODIPINE BESYLATE 5 MG PO TABS: 5.0000 mg | ORAL_TABLET | Freq: Every day | ORAL | 3 refills | Status: DC

## 2023-01-01 NOTE — Telephone Encounter (Signed)
Patient dropped off BP readings for 11/26/22-01/01/23 and document placed in mailbox with mail this afternoon.

## 2023-01-01 NOTE — Patient Instructions (Addendum)
Summary of today's discussion  1.I will call Dr. Serita Grit office and discuss resuming amlodipine. Please resume amlodipine 5mg  daily tonight  2. Continue metoprolol succinate 25mg  daily, lisinopril 40mg  daily for now  3.STOP doxazosin  4. Continue checking blood pressure and HR at home  5. Please call me in 1 week if no improvement in blood pressure   Your blood pressure goal is <130/80  To check your pressure at home you will need to:  1. Sit up in a chair, with feet flat on the floor and back supported. Do not cross your ankles or legs. 2. Rest your left arm so that the cuff is about heart level. If the cuff goes on your upper arm,  then just relax the arm on the table, arm of the chair or your lap. If you have a wrist cuff, we  suggest relaxing your wrist against your chest (think of it as Pledging the Flag with the  wrong arm).  3. Place the cuff snugly around your arm, about 1 inch above the crook of your elbow. The  cords should be inside the groove of your elbow.  4. Sit quietly, with the cuff in place, for about 5 minutes. After that 5 minutes press the power  button to start a reading. 5. Do not talk or move while the reading is taking place.  6. Record your readings on a sheet of paper. Although most cuffs have a memory, it is often  easier to see a pattern developing when the numbers are all in front of you.  7. You can repeat the reading after 1-3 minutes if it is recommended  Make sure your bladder is empty and you have not had caffeine or tobacco within the last 30 min  Always bring your blood pressure log with you to your appointments. If you have not brought your monitor in to be double checked for accuracy, please bring it to your next appointment.  You can find a list of validated (accurate) blood pressure cuffs at [employer]   Important lifestyle changes to control high blood pressure  Intervention  Effect on the BP  Lose extra pounds and watch your  waistline Weight loss is one of the most effective lifestyle changes for controlling blood pressure. If you're overweight or obese, losing even a small amount of weight can help reduce blood pressure. Blood pressure might go down by about 1 millimeter of mercury (mm Hg) with each kilogram (about 2.2 pounds) of weight lost.  Exercise regularly As a general goal, aim for at least 30 minutes of moderate physical activity every day. Regular physical activity can lower high blood pressure by about 5 to 8 mm Hg.  Eat a healthy diet Eating a diet rich in whole grains, fruits, vegetables, and low-fat dairy products and low in saturated fat and cholesterol. A healthy diet can lower high blood pressure by up to 11 mm Hg.  Reduce salt (sodium) in your diet Even a small reduction of sodium in the diet can improve heart health and reduce high blood pressure by about 5 to 6 mm Hg.  Limit alcohol One drink equals 12 ounces of beer, 5 ounces of wine, or 1.5 ounces of 80-proof liquor.  Limiting alcohol to less than one drink a day for women or two drinks a day for men can help lower blood pressure by about 4 mm Hg.   Please call me at [phone] with any questions.

## 2023-01-01 NOTE — Assessment & Plan Note (Signed)
Assessment: Blood pressure significantly elevated in clinic today.  Home blood pressures slightly better.  Cuff found to be accurate I do not see any contraindication to using amlodipine with as needed diltiazem patient does not frequently need Patient would like to confirm that his nephrologist is okay with this change Patient would really like to get off of doxazosin. feels like it makes him feel unsteady on his feet and dizzy.  He does check his blood pressure both standing and sitting and on some occasions there is a 10pt difference or so between his standing and sitting blood pressure  Plan: Stop doxazosin Start amlodipine 5 mg daily Continue lisinopril 40 mg daily and metoprolol succinate 25 mg daily Patient to call me in 1 week if blood pressure does not start improving Follow-up in clinic 4/29-same day he sees Erik Perez I will reach out to Dr. Serita Grit office to confirm he is okay with this plan per patient request

## 2023-01-05 ENCOUNTER — Encounter: Payer: Self-pay | Admitting: Internal Medicine

## 2023-01-05 ENCOUNTER — Telehealth: Payer: Self-pay | Admitting: Pharmacist

## 2023-01-05 NOTE — Telephone Encounter (Signed)
Left VM at [facility] for Erik Perez. Pt wished to confirm Dr. Allena Katz is ok with starting amlodipine 5mg  daily.

## 2023-01-06 ENCOUNTER — Encounter: Payer: Self-pay | Admitting: Pharmacist

## 2023-01-08 ENCOUNTER — Encounter: Payer: Self-pay | Admitting: Pharmacist

## 2023-01-09 MED ORDER — AMLODIPINE BESYLATE 10 MG PO TABS: 10.0000 mg | ORAL_TABLET | Freq: Every day | ORAL | 3 refills | Status: DC

## 2023-01-12 DIAGNOSIS — Z974 Presence of external hearing-aid: Secondary | ICD-10-CM | POA: Diagnosis not present

## 2023-01-12 DIAGNOSIS — Z8669 Personal history of other diseases of the nervous system and sense organs: Secondary | ICD-10-CM | POA: Diagnosis not present

## 2023-01-12 DIAGNOSIS — H938X3 Other specified disorders of ear, bilateral: Secondary | ICD-10-CM | POA: Diagnosis not present

## 2023-01-15 ENCOUNTER — Encounter: Payer: Self-pay | Admitting: Pharmacist

## 2023-01-19 ENCOUNTER — Ambulatory Visit: Payer: Medicare PPO | Admitting: Nurse Practitioner

## 2023-01-21 DIAGNOSIS — E538 Deficiency of other specified B group vitamins: Secondary | ICD-10-CM | POA: Diagnosis not present

## 2023-01-22 ENCOUNTER — Ambulatory Visit
Admission: RE | Admit: 2023-01-22 | Discharge: 2023-01-22 | Disposition: A | Discharge status: Home / Self Care | Payer: Medicare PPO | Source: Ambulatory Visit | Attending: Anesthesiology | Admitting: Anesthesiology

## 2023-01-22 DIAGNOSIS — C61 Malignant neoplasm of prostate: Secondary | ICD-10-CM | POA: Diagnosis not present

## 2023-01-22 MED ORDER — GADOPICLENOL 0.5 MMOL/ML IV SOLN
10.0000 mL | Freq: Once | INTRAVENOUS | Status: AC | PRN
  Administered 2023-01-22: 10 mL via INTRAVENOUS

## 2023-01-26 ENCOUNTER — Ambulatory Visit: Payer: Medicare PPO | Attending: Cardiovascular Disease | Admitting: Pharmacist

## 2023-01-26 ENCOUNTER — Ambulatory Visit: Payer: Medicare PPO | Admitting: Student

## 2023-01-26 VITALS — BP 142/60 | HR 69

## 2023-01-26 DIAGNOSIS — I10 Essential (primary) hypertension: Secondary | ICD-10-CM | POA: Diagnosis not present

## 2023-01-26 NOTE — Assessment & Plan Note (Signed)
Assessment: Blood pressure above goal of less than 130/80 in clinic today Home blood pressure readings are better and are significantly improved from previous.  Averaging 139/66 Patient has started to increase how much he is walking  Plan: Continue amlodipine 10 mg daily, metoprolol succinate 5 mg daily lisinopril 40 mg daily Continue increasing exercise Will follow-up in 3 weeks If blood pressure not at goal at that time will consider adding hydralazine 12.5 mg 3 times a day

## 2023-01-26 NOTE — Patient Instructions (Signed)
Summary of today's discussion  1.Continue amlodipine 10 mg daily, metoprolol succinate 25mg  daily, lisinopril 40mg  daily  2. Continue checking blood pressure at home  3.Continue walking and PT exercises  4.  5.   Your blood pressure goal is <130/80  To check your pressure at home you will need to:  1. Sit up in a chair, with feet flat on the floor and back supported. Do not cross your ankles or legs. 2. Rest your left arm so that the cuff is about heart level. If the cuff goes on your upper arm,  then just relax the arm on the table, arm of the chair or your lap. If you have a wrist cuff, we  suggest relaxing your wrist against your chest (think of it as Pledging the Flag with the  wrong arm).  3. Place the cuff snugly around your arm, about 1 inch above the crook of your elbow. The  cords should be inside the groove of your elbow.  4. Sit quietly, with the cuff in place, for about 5 minutes. After that 5 minutes press the power  button to start a reading. 5. Do not talk or move while the reading is taking place.  6. Record your readings on a sheet of paper. Although most cuffs have a memory, it is often  easier to see a pattern developing when the numbers are all in front of you.  7. You can repeat the reading after 1-3 minutes if it is recommended  Make sure your bladder is empty and you have not had caffeine or tobacco within the last 30 min  Always bring your blood pressure log with you to your appointments. If you have not brought your monitor in to be double checked for accuracy, please bring it to your next appointment.  You can find a list of validated (accurate) blood pressure cuffs at [employer].no   Important lifestyle changes to control high blood pressure  Intervention  Effect on the BP  Lose extra pounds and watch your waistline Weight loss is one of the most effective lifestyle changes for controlling blood pressure. If you're overweight or obese, losing even a  small amount of weight can help reduce blood pressure. Blood pressure might go down by about 1 millimeter of mercury (mm Hg) with each kilogram (about 2.2 pounds) of weight lost.  Exercise regularly As a general goal, aim for at least 30 minutes of moderate physical activity every day. Regular physical activity can lower high blood pressure by about 5 to 8 mm Hg.  Eat a healthy diet Eating a diet rich in whole grains, fruits, vegetables, and low-fat dairy products and low in saturated fat and cholesterol. A healthy diet can lower high blood pressure by up to 11 mm Hg.  Reduce salt (sodium) in your diet Even a small reduction of sodium in the diet can improve heart health and reduce high blood pressure by about 5 to 6 mm Hg.  Limit alcohol One drink equals 12 ounces of beer, 5 ounces of wine, or 1.5 ounces of 80-proof liquor.  Limiting alcohol to less than one drink a day for women or two drinks a day for men can help lower blood pressure by about 4 mm Hg.   Please call me at [phone] with any questions.

## 2023-01-26 NOTE — Progress Notes (Signed)
Patient ID: Erik Perez                 DOB: 12-19-1945                      MRN: [id number]      HPI: Erik Perez is a 77 y.o. male referred by Dr. Lynnette Caffey to HTN clinic. PMH is significant for HTN, CAD s/p CABG, PAF and CKD stage 4.  Seen by Dr. Lynnette Caffey at the end of February.  Patient was concerned with increasing blood pressure and he was referred to hypertension clinic.  Patient seen initially in hypertension clinic on 01/01/2023.  At that time amlodipine 5 mg daily was started and doxazosin was stopped.  Confirmed with nephrologist, Dr. Zetta Bills that he was okay with starting amlodipine.  Blood pressure readings were in the 150s to 160s /70s to 80s therefore amlodipine was increased to 10 mg daily.   Patient presents to clinic today.  Reports feeling a lot better.  He is walking more often and overall feels a lot better.  Happy with his improvement in blood pressure on amlodipine 10 mg. Denies any dizziness or lightheadedness.  Some concerns about his leg weakness but is continuing to do his physical therapy and walk with a walker.  He brings in another home blood pressure cuff that he prefers to use.  Is an Omron.  Other home cuff:150/65 Office:160/58 Office: 154/60 Home cuff:142/61 Office: manual 152/58 Clinic Automatic 142/60  Current HTN meds: Amlodipine 10 mg daily, metoprolol succinate 25mg  daily, lisinopril 40mg  daily Previously tried: amlodipine BP goal: <130/80  Family History:  Family History  Problem Relation Age of Onset   Heart disease Mother    Heart disease Father    Heart disease Brother     Social History: very little ETOH, former smoker  Diet:   Exercise: walking daily 20-30 min   Home BP readings:  145 69  139 65  147 71  138 68  133 65  134 64  138 65  139.1429 66.71429    Wt Readings from Last 3 Encounters:  11/25/22 207 lb 6.4 oz (94.1 kg)  10/27/22 205 lb (93 kg)  10/17/22 205 lb 6.4 oz (93.2 kg)   BP Readings from  Last 3 Encounters:  01/26/23 (!) 142/60  01/01/23 (!) 200/78  11/25/22 (!) 160/70   Pulse Readings from Last 3 Encounters:  01/26/23 69  01/01/23 (!) 52  11/25/22 69    Renal function: CrCl cannot be calculated (Patient's most recent lab result is older than the maximum 21 days allowed.).  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     allopurinol (ZYLOPRIM) 100 MG tablet Take 100 mg by mouth 2 (two) times daily.     amiodarone (PACERONE) 200 MG tablet Take 200 mg by mouth daily. Through Dec. 15th and then goes to once daily     amLODipine (NORVASC) 10  MG tablet Take 1 tablet (10 mg total) by mouth daily. 90 tablet 3   apixaban (ELIQUIS) 5 MG TABS tablet Take 1 tablet (5 mg total) by mouth 2 (two) times daily. 60 tablet 3   atorvastatin (LIPITOR) 40 MG tablet Take 40 mg by mouth daily.     calcium gluconate 500 MG tablet Take 1 tablet by mouth 3 (three) times daily.     Cholecalciferol (VITAMIN D) 2000 UNITS tablet Take 2,000 Units by mouth 2 (two) times daily.      Cyanocobalamin (B-12 IJ) Take one (1) injection into the muscle once a month.     diltiazem (CARDIZEM) 30 MG tablet Take 1 tablet (30 mg total) by mouth 2 (two) times daily as needed (for heart rate greater than 110).     fexofenadine (ALLEGRA) 180 MG tablet Take 180 mg by mouth daily.     furosemide (LASIX) 20 MG tablet Take 20 mg oral daily for next 5 days, then as needed for edema or fluid retention 30 tablet 0   lisinopril (ZESTRIL) 40 MG tablet  Take 40 mg by mouth daily.     metoprolol succinate (TOPROL XL) 25 MG 24 hr tablet Take 1 tablet (25 mg total) by mouth at bedtime. 90 tablet 3   Multiple Vitamin (MULTIVITAMIN) tablet Take 1 tablet by mouth daily.     nitroGLYCERIN (NITROSTAT) 0.4 MG SL tablet Place 1 tablet (0.4 mg total) under the tongue every 5 (five) minutes as needed for chest pain. 25 tablet 1   Omega-3 Fatty Acids (FISH OIL) 1200 MG CAPS Take 1,200 mg by mouth 2 (two) times daily.     No current facility-administered medications on file prior to visit.    Allergies  Allergen Reactions   Colchicine Other (See Comments)   Diclofenac Sodium     Chest Pain   Gabapentin     Increased sedation    Niaspan [Niacin Er]     Involuntary facial flushing    Blood pressure (!) 142/60, pulse 69.   Assessment/Plan:  1. Hypertension -  Essential hypertension, benign Assessment: Blood pressure above goal of less than 130/80 in clinic today Home blood pressure readings are better and are significantly improved from previous.  Averaging 139/66 Patient has started to increase how much he is walking  Plan: Continue amlodipine 10 mg daily, metoprolol succinate 5 mg daily lisinopril 40 mg daily Continue increasing exercise Will follow-up in 3 weeks If blood pressure not at goal at that time will consider adding hydralazine 12.5 mg 3 times a day   Thank you  Olene Floss, Pharm.D, BCPS, CPP [facility] Division of [facility] [address] [place], [place] [address]  Phone: [phone]; Fax: [phone]

## 2023-01-28 ENCOUNTER — Other Ambulatory Visit: Payer: Medicare PPO

## 2023-02-01 NOTE — Progress Notes (Unsigned)
Cardiology Office Note Date:  02/01/2023  Patient ID:  Erik Perez, Erik Perez 1946/06/14, MRN [id number] PCP:  Jackelyn Poling, DO  Cardiologist:  Dr. Lynnette Caffey Electrophysiologist: Dr. Elberta Fortis  ***refresh   Chief Complaint: *** pre-ablation visit  History of Present Illness: Erik Perez is a 77 y.o. male with history of CAD (CABG 2000, PCI 2002), HTN, OSA (wCPAP), HLD,  AFib, CKD (IV)  Saw Dr. Elberta Fortis 10/02/22, recently had COVID and his GB out, tired, but felt 2/2 to these. Pt preferred to avoid longterm amiodarone and planned for ablation  Saw Dr. Kizzie Bane 11/25/22, lopressor changed to Toprol, referred to [facility] for BP   *** symptoms *** eliquis, bleeding, dose, labs, compliance *** procedure, day, after *** amio labs *** scheduled 5/15, no CT scheduled yet?  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 Taxus stent 2002    Current Outpatient Medications  Medication Sig Dispense Refill   acetaminophen (TYLENOL) 325 MG tablet Take 2 tablets (650 mg total) by mouth every 6 (six) hours as needed for mild pain (or temp > 100).     allopurinol (ZYLOPRIM) 100 MG tablet Take 100 mg by mouth 2 (two) times daily.     amiodarone (PACERONE) 200 MG tablet Take 200 mg by mouth daily. Through Dec. 15th and then goes to once daily      amLODipine (NORVASC) 10 MG tablet Take 1 tablet (10 mg total) by mouth daily. 90 tablet 3   apixaban (ELIQUIS) 5 MG TABS tablet Take 1 tablet (5 mg total) by mouth 2 (two) times daily. 60 tablet 3   atorvastatin (LIPITOR) 40 MG tablet Take 40 mg by mouth daily.     calcium gluconate 500 MG tablet Take 1 tablet by mouth 3 (three) times daily.     Cholecalciferol (VITAMIN D) 2000 UNITS tablet Take 2,000 Units by mouth 2 (two) times daily.      Cyanocobalamin (B-12 IJ) Take one (1) injection into the muscle once a month.     diltiazem (CARDIZEM) 30 MG tablet Take 1 tablet (30 mg total) by mouth 2 (two) times daily as needed (for heart rate greater than 110).     fexofenadine (ALLEGRA) 180 MG tablet Take 180 mg by mouth daily.     furosemide (LASIX) 20 MG tablet Take 20 mg oral daily for next 5 days, then as needed for edema or fluid retention 30 tablet 0   lisinopril (ZESTRIL) 40 MG tablet Take 40 mg by mouth daily.     metoprolol succinate (TOPROL XL) 25 MG 24 hr tablet Take 1 tablet (25 mg total) by mouth at bedtime. 90 tablet 3   Multiple Vitamin (MULTIVITAMIN) tablet Take 1 tablet by mouth daily.     nitroGLYCERIN (NITROSTAT) 0.4 MG SL tablet  Place 1 tablet (0.4 mg total) under the tongue every 5 (five) minutes as needed for chest pain. 25 tablet 1   Omega-3 Fatty Acids (FISH OIL) 1200 MG CAPS Take 1,200 mg by mouth 2 (two) times daily.     No current facility-administered medications for this visit.    Allergies:   Colchicine, Diclofenac sodium, Gabapentin, and Niaspan [niacin er]   Social History:  The patient  reports that he quit smoking about 39 years ago. His smoking use included cigarettes. He started smoking about 62 years ago. He has a 7.59 pack-year smoking history. He has never used smokeless tobacco. He reports current alcohol use. He reports that he does not use drugs.   Family History:  The patient's family history includes Heart disease in his brother, father, and  mother.  ROS:  Please see the history of present illness.    All other systems are reviewed and otherwise negative.   PHYSICAL EXAM:  VS:  There were no vitals taken for this visit. BMI: There is no height or weight on file to calculate BMI. Well nourished, well developed, in no acute distress HEENT: normocephalic, atraumatic Neck: no JVD, carotid bruits or masses Cardiac:  *** RRR; no significant murmurs, no rubs, or gallops Lungs:  *** CTA b/l, no wheezing, rhonchi or rales Abd: soft, nontender MS: no deformity or *** atrophy Ext: *** no edema Skin: warm and dry, no rash Neuro:  No gross deficits appreciated Psych: euthymic mood, full affect   EKG:  Done today and reviewed by myself shows  ***  04/11/22 TTE  1. Left ventricular ejection fraction, by estimation, is 60 to 65%. Left  ventricular ejection fraction by 3D volume is 60 %. The left ventricle has  normal function. The left ventricle has no regional wall motion  abnormalities. Left ventricular diastolic   parameters were normal.   2. Right ventricular systolic function is normal. The right ventricular  size is normal. Tricuspid regurgitation signal is inadequate for assessing  PA pressure.   3. The mitral valve is normal in structure. No evidence of mitral valve  regurgitation. No evidence of mitral stenosis.   4. The aortic valve is tricuspid. Aortic valve regurgitation is not  visualized. Aortic valve sclerosis/calcification is present, without any  evidence of aortic stenosis.   5. The inferior vena cava is normal in size with greater than 50%  respiratory variability, suggesting right atrial pressure of 3 mmHg.    Cardiac monitor 09/29/2022 personally reviewed   Basic underlying rhythm is NSR   < 1% AF burden over duration of study. Two episodes occurred.   The AF episode with the longest duration was 7 hours on 08/20/2022. 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 Findings are equivocal. The study is low risk.   No ST deviation was noted.   LV perfusion is abnormal. There is no evidence of ischemia. There is evidence of infarction. Defect 1: There is a medium defect with moderate reduction in uptake present in the apical to mid anterior location(s) that is fixed. There is abnormal wall motion in the defect area. Consistent with infarction.   Left ventricular function is abnormal. There was a single regional abnormality. Nuclear stress EF: 57 %. The left ventricular ejection fraction is normal (55-65%). End diastolic cavity size is normal. End systolic cavity size is normal.  Recent Labs: 04/08/2022: NT-Pro  BNP 174 07/10/2022: TSH 1.671 09/24/2022: B Natriuretic Peptide 463.9 09/26/2022: Magnesium 2.2 09/29/2022: BUN 48; Creatinine, Ser 3.52; Hemoglobin 8.5; Platelets 195; Potassium 3.6; Sodium 139 11/25/2022: ALT 86  11/25/2022: Chol/HDL Ratio 2.5; Cholesterol, Total 111; HDL 44; LDL Chol Calc (NIH) 50; Triglycerides 84   CrCl cannot be calculated (Patient's most recent lab result is older than the maximum 21 days allowed.).   Wt Readings from Last 3 Encounters:  11/25/22 207 lb 6.4 oz (94.1 kg)  10/27/22 205 lb (93 kg)  10/17/22 205 lb 6.4 oz (93.2 kg)     Other studies reviewed: Additional studies/records reviewed today include: summarized above  ASSESSMENT AND PLAN:  Paroxysmal AFib CHA2DS2Vasc is 4, on eliquis, *** appropriately dosed Amiodarone ***   CAD ***  HTN ***   Disposition: F/u with ***  Current medicines are reviewed at length with the patient today.  The patient did not have any concerns regarding medicines.  Norma Fredrickson, PA-C 02/01/2023 6:34 PM     [facility] [address] [place] [address] [phone] (office)  [phone] (fax)

## 2023-02-01 NOTE — H&P (View-Only) (Signed)
   Cardiology Office Note Date:  02/01/2023  Patient ID:  Erik Perez, DOB 04/30/1946, MRN [id number] PCP:  Welborn, Ryan, DO  Cardiologist:  Dr. Thukkani Electrophysiologist: Dr. Camnitz  ***refresh   Chief Complaint: *** pre-ablation visit  History of Present Illness: Erik Perez is a 77 y.o. male with history of CAD (CABG 2000, PCI 2002), HTN, OSA (wCPAP), HLD,  AFib, CKD (IV)  Saw Dr. Camnitz 10/02/22, recently had COVID and his GB out, tired, but felt 2/2 to these. Pt preferred to avoid longterm amiodarone and planned for ablation  Saw Dr. Thikkani 11/25/22, lopressor changed to Toprol, referred to [facility] for BP   *** symptoms *** eliquis, bleeding, dose, labs, compliance *** procedure, day, after *** amio labs *** scheduled 5/15, no CT scheduled yet?  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 Taxus stent 2002    Current Outpatient Medications  Medication Sig Dispense Refill   acetaminophen (TYLENOL) 325 MG tablet Take 2 tablets (650 mg total) by mouth every 6 (six) hours as needed for mild pain (or temp > 100).     allopurinol (ZYLOPRIM) 100 MG tablet Take 100 mg by mouth 2 (two) times daily.     amiodarone (PACERONE) 200 MG tablet Take 200 mg by mouth daily. Through Dec. 15th and then goes to once daily      amLODipine (NORVASC) 10 MG tablet Take 1 tablet (10 mg total) by mouth daily. 90 tablet 3   apixaban (ELIQUIS) 5 MG TABS tablet Take 1 tablet (5 mg total) by mouth 2 (two) times daily. 60 tablet 3   atorvastatin (LIPITOR) 40 MG tablet Take 40 mg by mouth daily.     calcium gluconate 500 MG tablet Take 1 tablet by mouth 3 (three) times daily.     Cholecalciferol (VITAMIN D) 2000 UNITS tablet Take 2,000 Units by mouth 2 (two) times daily.      Cyanocobalamin (B-12 IJ) Take one (1) injection into the muscle once a month.     diltiazem (CARDIZEM) 30 MG tablet Take 1 tablet (30 mg total) by mouth 2 (two) times daily as needed (for heart rate greater than 110).     fexofenadine (ALLEGRA) 180 MG tablet Take 180 mg by mouth daily.     furosemide (LASIX) 20 MG tablet Take 20 mg oral daily for next 5 days, then as needed for edema or fluid retention 30 tablet 0   lisinopril (ZESTRIL) 40 MG tablet Take 40 mg by mouth daily.     metoprolol succinate (TOPROL XL) 25 MG 24 hr tablet Take 1 tablet (25 mg total) by mouth at bedtime. 90 tablet 3   Multiple Vitamin (MULTIVITAMIN) tablet Take 1 tablet by mouth daily.     nitroGLYCERIN (NITROSTAT) 0.4 MG SL tablet   Place 1 tablet (0.4 mg total) under the tongue every 5 (five) minutes as needed for chest pain. 25 tablet 1   Omega-3 Fatty Acids (FISH OIL) 1200 MG CAPS Take 1,200 mg by mouth 2 (two) times daily.     No current facility-administered medications for this visit.    Allergies:   Colchicine, Diclofenac sodium, Gabapentin, and Niaspan [niacin er]   Social History:  The patient  reports that he quit smoking about 39 years ago. His smoking use included cigarettes. He started smoking about 62 years ago. He has a 7.59 pack-year smoking history. He has never used smokeless tobacco. He reports current alcohol use. He reports that he does not use drugs.   Family History:  The patient's family history includes Heart disease in his brother, father, and  mother.  ROS:  Please see the history of present illness.    All other systems are reviewed and otherwise negative.   PHYSICAL EXAM:  VS:  There were no vitals taken for this visit. BMI: There is no height or weight on file to calculate BMI. Well nourished, well developed, in no acute distress HEENT: normocephalic, atraumatic Neck: no JVD, carotid bruits or masses Cardiac:  *** RRR; no significant murmurs, no rubs, or gallops Lungs:  *** CTA b/l, no wheezing, rhonchi or rales Abd: soft, nontender MS: no deformity or *** atrophy Ext: *** no edema Skin: warm and dry, no rash Neuro:  No gross deficits appreciated Psych: euthymic mood, full affect   EKG:  Done today and reviewed by myself shows  ***  04/11/22 TTE  1. Left ventricular ejection fraction, by estimation, is 60 to 65%. Left  ventricular ejection fraction by 3D volume is 60 %. The left ventricle has  normal function. The left ventricle has no regional wall motion  abnormalities. Left ventricular diastolic   parameters were normal.   2. Right ventricular systolic function is normal. The right ventricular  size is normal. Tricuspid regurgitation signal is inadequate for assessing  PA pressure.   3. The mitral valve is normal in structure. No evidence of mitral valve  regurgitation. No evidence of mitral stenosis.   4. The aortic valve is tricuspid. Aortic valve regurgitation is not  visualized. Aortic valve sclerosis/calcification is present, without any  evidence of aortic stenosis.   5. The inferior vena cava is normal in size with greater than 50%  respiratory variability, suggesting right atrial pressure of 3 mmHg.    Cardiac monitor 09/29/2022 personally reviewed   Basic underlying rhythm is NSR   < 1% AF burden over duration of study. Two episodes occurred.   The AF episode with the longest duration was 7 hours on 08/20/2022. 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 Findings are equivocal. The study is low risk.   No ST deviation was noted.   LV perfusion is abnormal. There is no evidence of ischemia. There is evidence of infarction. Defect 1: There is a medium defect with moderate reduction in uptake present in the apical to mid anterior location(s) that is fixed. There is abnormal wall motion in the defect area. Consistent with infarction.   Left ventricular function is abnormal. There was a single regional abnormality. Nuclear stress EF: 57 %. The left ventricular ejection fraction is normal (55-65%). End diastolic cavity size is normal. End systolic cavity size is normal.  Recent Labs: 04/08/2022: NT-Pro   BNP 174 07/10/2022: TSH 1.671 09/24/2022: B Natriuretic Peptide 463.9 09/26/2022: Magnesium 2.2 09/29/2022: BUN 48; Creatinine, Ser 3.52; Hemoglobin 8.5; Platelets 195; Potassium 3.6; Sodium 139 11/25/2022: ALT 86  11/25/2022: Chol/HDL Ratio 2.5; Cholesterol, Total 111; HDL 44; LDL Chol Calc (NIH) 50; Triglycerides 84   CrCl cannot be calculated (Patient's most recent lab result is older than the maximum 21 days allowed.).   Wt Readings from Last 3 Encounters:  11/25/22 207 lb 6.4 oz (94.1 kg)  10/27/22 205 lb (93 kg)  10/17/22 205 lb 6.4 oz (93.2 kg)     Other studies reviewed: Additional studies/records reviewed today include: summarized above  ASSESSMENT AND PLAN:  Paroxysmal AFib CHA2DS2Vasc is 4, on eliquis, *** appropriately dosed Amiodarone ***   CAD ***  HTN ***   Disposition: F/u with ***  Current medicines are reviewed at length with the patient today.  The patient did not have any concerns regarding medicines.  Signed, Herndon Grill, PA-C 02/01/2023 6:34 PM     [facility] [address] [place]  [address] [phone] (office)  [phone] (fax)   

## 2023-02-05 ENCOUNTER — Ambulatory Visit: Payer: Medicare PPO | Attending: Student | Admitting: Physician Assistant

## 2023-02-05 ENCOUNTER — Encounter: Payer: Self-pay | Admitting: Physician Assistant

## 2023-02-05 VITALS — BP 140/62 | HR 63 | Ht 71.0 in | Wt 213.2 lb

## 2023-02-05 DIAGNOSIS — I2510 Atherosclerotic heart disease of native coronary artery without angina pectoris: Secondary | ICD-10-CM

## 2023-02-05 DIAGNOSIS — I480 Paroxysmal atrial fibrillation: Secondary | ICD-10-CM | POA: Diagnosis not present

## 2023-02-05 DIAGNOSIS — I10 Essential (primary) hypertension: Secondary | ICD-10-CM | POA: Diagnosis not present

## 2023-02-05 DIAGNOSIS — Z79899 Other long term (current) drug therapy: Secondary | ICD-10-CM | POA: Diagnosis not present

## 2023-02-05 DIAGNOSIS — Z01818 Encounter for other preprocedural examination: Secondary | ICD-10-CM | POA: Diagnosis not present

## 2023-02-05 LAB — CBC

## 2023-02-05 LAB — COMPREHENSIVE METABOLIC PANEL
Alkaline Phosphatase: 56 IU/L (ref 44–121)
BUN/Creatinine Ratio: 16 (ref 10–24)
BUN: 41 mg/dL — ABNORMAL HIGH (ref 8–27)
Chloride: 105 mmol/L (ref 96–106)
Globulin, Total: 2.5 g/dL (ref 1.5–4.5)
Glucose: 99 mg/dL (ref 70–99)
Sodium: 138 mmol/L (ref 134–144)

## 2023-02-05 NOTE — Patient Instructions (Addendum)
Medication Instructions:   Your physician recommends that you continue on your current medications as directed. Please refer to the Current Medication list given to you today.   *If you need a refill on your cardiac medications before your next appointment, please call your pharmacy*   Lab Work:  CMET CBC TSH    If you have labs (blood work) drawn today and your tests are completely normal, you will receive your results only by: MyChart Message (if you have MyChart) OR A paper copy in the mail If you have any lab test that is abnormal or we need to change your treatment, we will call you to review the results.   Testing/Procedures: SEE LETTER FOR PROCEDURE     Follow-Up: At [facility], you and your health needs are our priority.  As part of our continuing mission to provide you with exceptional heart care, we have created designated Provider Care Teams.  These Care Teams include your primary Cardiologist (physician) and Advanced Practice Providers (APPs -  Physician Assistants and Nurse Practitioners) who all work together to provide you with the care you need, when you need it.  We recommend signing up for the patient portal called "MyChart".  Sign up information is provided on this After Visit Summary.  MyChart is used to connect with patients for Virtual Visits (Telemedicine).  Patients are able to view lab/test results, encounter notes, upcoming appointments, etc.  Non-urgent messages can be sent to your provider as well.   To learn more about what you can do with MyChart, go to [email or web address].    Your next appointment:   AS SCHEDULED   Provider:   Loman Brooklyn, MD    Other Instructions

## 2023-02-06 DIAGNOSIS — D225 Melanocytic nevi of trunk: Secondary | ICD-10-CM | POA: Diagnosis not present

## 2023-02-06 DIAGNOSIS — L918 Other hypertrophic disorders of the skin: Secondary | ICD-10-CM | POA: Diagnosis not present

## 2023-02-06 DIAGNOSIS — D485 Neoplasm of uncertain behavior of skin: Secondary | ICD-10-CM | POA: Diagnosis not present

## 2023-02-06 DIAGNOSIS — L821 Other seborrheic keratosis: Secondary | ICD-10-CM | POA: Diagnosis not present

## 2023-02-06 DIAGNOSIS — D2271 Melanocytic nevi of right lower limb, including hip: Secondary | ICD-10-CM | POA: Diagnosis not present

## 2023-02-06 DIAGNOSIS — D692 Other nonthrombocytopenic purpura: Secondary | ICD-10-CM | POA: Diagnosis not present

## 2023-02-06 DIAGNOSIS — L817 Pigmented purpuric dermatosis: Secondary | ICD-10-CM | POA: Diagnosis not present

## 2023-02-06 LAB — COMPREHENSIVE METABOLIC PANEL
ALT: 42 IU/L (ref 0–44)
AST: 27 IU/L (ref 0–40)
Albumin/Globulin Ratio: 1.6 (ref 1.2–2.2)
Albumin: 4 g/dL (ref 3.8–4.8)
Bilirubin Total: 0.7 mg/dL (ref 0.0–1.2)
CO2: 22 mmol/L (ref 20–29)
Calcium: 9.2 mg/dL (ref 8.6–10.2)
Creatinine, Ser: 2.53 mg/dL — ABNORMAL HIGH (ref 0.76–1.27)
Potassium: 5.2 mmol/L (ref 3.5–5.2)
Total Protein: 6.5 g/dL (ref 6.0–8.5)
eGFR: 25 mL/min/{1.73_m2} — ABNORMAL LOW (ref >59)

## 2023-02-06 LAB — CBC
Hematocrit: 37.2 % — ABNORMAL LOW (ref 37.5–51.0)
Hemoglobin: 12.3 g/dL — ABNORMAL LOW (ref 13.0–17.7)
MCH: 34.1 pg — ABNORMAL HIGH (ref 26.6–33.0)
MCHC: 33.1 g/dL (ref 31.5–35.7)
MCV: 103 fL — ABNORMAL HIGH (ref 79–97)
RBC: 3.61 x10E6/uL — ABNORMAL LOW (ref 4.14–5.80)
RDW: 13.1 % (ref 11.6–15.4)

## 2023-02-06 LAB — TSH: TSH: 2.57 u[IU]/mL (ref 0.450–4.500)

## 2023-02-09 DIAGNOSIS — C61 Malignant neoplasm of prostate: Secondary | ICD-10-CM | POA: Diagnosis not present

## 2023-02-10 NOTE — Pre-Procedure Instructions (Signed)
Attempted to call patient regarding procedure instructions for tomorrow.  Left voicemail on the following items: Arrival time 0900 Nothing to eat or drink after midnight No meds AM of procedure Responsible person to drive you home and stay with you for 24 hrs  Have you missed any doses of anti-coagulant Eliquis- should be taken twice a day, if you have missed any doses please let office know right away.  Don't take Eliquis morning of procedure.

## 2023-02-11 ENCOUNTER — Ambulatory Visit (HOSPITAL_COMMUNITY): Payer: Medicare PPO | Admitting: Certified Registered Nurse Anesthetist

## 2023-02-11 ENCOUNTER — Ambulatory Visit (HOSPITAL_BASED_OUTPATIENT_CLINIC_OR_DEPARTMENT_OTHER): Payer: Medicare PPO | Admitting: Certified Registered Nurse Anesthetist

## 2023-02-11 ENCOUNTER — Other Ambulatory Visit: Payer: Self-pay

## 2023-02-11 ENCOUNTER — Ambulatory Visit (HOSPITAL_COMMUNITY)
Admission: RE | Admit: 2023-02-11 | Discharge: 2023-02-11 | Disposition: A | Discharge status: Home / Self Care | Payer: Medicare PPO | Attending: Cardiology | Admitting: Cardiology

## 2023-02-11 ENCOUNTER — Ambulatory Visit (HOSPITAL_COMMUNITY)
Admission: RE | Disposition: A | Discharge status: Home / Self Care | Payer: Self-pay | Source: Home / Self Care | Attending: Cardiology

## 2023-02-11 DIAGNOSIS — I129 Hypertensive chronic kidney disease with stage 1 through stage 4 chronic kidney disease, or unspecified chronic kidney disease: Secondary | ICD-10-CM | POA: Diagnosis not present

## 2023-02-11 DIAGNOSIS — I4891 Unspecified atrial fibrillation: Secondary | ICD-10-CM

## 2023-02-11 DIAGNOSIS — I480 Paroxysmal atrial fibrillation: Secondary | ICD-10-CM

## 2023-02-11 DIAGNOSIS — Z7901 Long term (current) use of anticoagulants: Secondary | ICD-10-CM | POA: Insufficient documentation

## 2023-02-11 DIAGNOSIS — Z951 Presence of aortocoronary bypass graft: Secondary | ICD-10-CM | POA: Diagnosis not present

## 2023-02-11 DIAGNOSIS — Z8249 Family history of ischemic heart disease and other diseases of the circulatory system: Secondary | ICD-10-CM | POA: Insufficient documentation

## 2023-02-11 DIAGNOSIS — N184 Chronic kidney disease, stage 4 (severe): Secondary | ICD-10-CM | POA: Insufficient documentation

## 2023-02-11 DIAGNOSIS — I10 Essential (primary) hypertension: Secondary | ICD-10-CM | POA: Diagnosis not present

## 2023-02-11 DIAGNOSIS — I4819 Other persistent atrial fibrillation: Secondary | ICD-10-CM | POA: Insufficient documentation

## 2023-02-11 DIAGNOSIS — G4733 Obstructive sleep apnea (adult) (pediatric): Secondary | ICD-10-CM | POA: Diagnosis not present

## 2023-02-11 DIAGNOSIS — I2510 Atherosclerotic heart disease of native coronary artery without angina pectoris: Secondary | ICD-10-CM

## 2023-02-11 DIAGNOSIS — Z87891 Personal history of nicotine dependence: Secondary | ICD-10-CM | POA: Diagnosis not present

## 2023-02-11 HISTORY — PX: ATRIAL FIBRILLATION ABLATION: EP1191

## 2023-02-11 LAB — POCT ACTIVATED CLOTTING TIME
Activated Clotting Time: 331 seconds
Activated Clotting Time: 304 seconds

## 2023-02-11 SURGERY — ATRIAL FIBRILLATION ABLATION
Anesthesia: General

## 2023-02-11 MED ORDER — DOBUTAMINE INFUSION FOR EP/ECHO/NUC (1000 MCG/ML)
INTRAVENOUS | Status: AC
  Filled 2023-02-11: qty 250

## 2023-02-11 MED ORDER — EPHEDRINE SULFATE-NACL 50-0.9 MG/10ML-% IV SOSY
PREFILLED_SYRINGE | INTRAVENOUS | Status: DC | PRN
  Administered 2023-02-11: 5 mg via INTRAVENOUS

## 2023-02-11 MED ORDER — DEXAMETHASONE SODIUM PHOSPHATE 10 MG/ML IJ SOLN
INTRAMUSCULAR | Status: DC | PRN
  Administered 2023-02-11: 10 mg via INTRAVENOUS

## 2023-02-11 MED ORDER — ONDANSETRON HCL 4 MG/2ML IJ SOLN
INTRAMUSCULAR | Status: DC | PRN
  Administered 2023-02-11: 4 mg via INTRAVENOUS

## 2023-02-11 MED ORDER — HEPARIN SODIUM (PORCINE) 1000 UNIT/ML IJ SOLN
INTRAMUSCULAR | Status: AC
  Filled 2023-02-11: qty 10

## 2023-02-11 MED ORDER — FENTANYL CITRATE (PF) 100 MCG/2ML IJ SOLN
INTRAMUSCULAR | Status: AC
  Filled 2023-02-11: qty 2

## 2023-02-11 MED ORDER — HEPARIN (PORCINE) IN NACL 1000-0.9 UT/500ML-% IV SOLN
INTRAVENOUS | Status: DC | PRN
  Administered 2023-02-11 (×4): 500 mL

## 2023-02-11 MED ORDER — SODIUM CHLORIDE 0.9 % IV SOLN: 250.0000 mL | INTRAVENOUS | Status: DC | PRN

## 2023-02-11 MED ORDER — HEPARIN SODIUM (PORCINE) 1000 UNIT/ML IJ SOLN
INTRAMUSCULAR | Status: DC | PRN
  Administered 2023-02-11: 14000 [IU] via INTRAVENOUS
  Administered 2023-02-11: 1000 [IU] via INTRAVENOUS

## 2023-02-11 MED ORDER — FENTANYL CITRATE (PF) 250 MCG/5ML IJ SOLN
INTRAMUSCULAR | Status: DC | PRN
  Administered 2023-02-11: 100 ug via INTRAVENOUS

## 2023-02-11 MED ORDER — ACETAMINOPHEN 325 MG PO TABS: 650.0000 mg | ORAL_TABLET | ORAL | Status: DC | PRN

## 2023-02-11 MED ORDER — SUGAMMADEX SODIUM 200 MG/2ML IV SOLN
INTRAVENOUS | Status: DC | PRN
  Administered 2023-02-11: 200 mg via INTRAVENOUS

## 2023-02-11 MED ORDER — PHENYLEPHRINE HCL-NACL 20-0.9 MG/250ML-% IV SOLN
INTRAVENOUS | Status: DC | PRN
  Administered 2023-02-11: 30 ug/min via INTRAVENOUS

## 2023-02-11 MED ORDER — ONDANSETRON HCL 4 MG/2ML IJ SOLN: 4.0000 mg | Freq: Four times a day (QID) | INTRAMUSCULAR | Status: DC | PRN

## 2023-02-11 MED ORDER — PROTAMINE SULFATE 10 MG/ML IV SOLN
INTRAVENOUS | Status: DC | PRN
  Administered 2023-02-11: 40 mg via INTRAVENOUS

## 2023-02-11 MED ORDER — PROPOFOL 10 MG/ML IV BOLUS
INTRAVENOUS | Status: DC | PRN
  Administered 2023-02-11: 170 mg via INTRAVENOUS

## 2023-02-11 MED ORDER — SODIUM CHLORIDE 0.9% FLUSH: 3.0000 mL | INTRAVENOUS | Status: DC | PRN

## 2023-02-11 MED ORDER — DOBUTAMINE INFUSION FOR EP/ECHO/NUC (1000 MCG/ML)
INTRAVENOUS | Status: DC | PRN
  Administered 2023-02-11: 20 ug/kg/min via INTRAVENOUS

## 2023-02-11 MED ORDER — LIDOCAINE 2% (20 MG/ML) 5 ML SYRINGE
INTRAMUSCULAR | Status: DC | PRN
  Administered 2023-02-11: 60 mg via INTRAVENOUS

## 2023-02-11 MED ORDER — ROCURONIUM BROMIDE 10 MG/ML (PF) SYRINGE
PREFILLED_SYRINGE | INTRAVENOUS | Status: DC | PRN
  Administered 2023-02-11: 60 mg via INTRAVENOUS

## 2023-02-11 MED ORDER — SODIUM CHLORIDE 0.9% FLUSH: 3.0000 mL | Freq: Two times a day (BID) | INTRAVENOUS | Status: DC

## 2023-02-11 MED ORDER — SODIUM CHLORIDE 0.9 % IV SOLN: INTRAVENOUS | Status: DC

## 2023-02-11 MED ORDER — HEPARIN SODIUM (PORCINE) 1000 UNIT/ML IJ SOLN
INTRAMUSCULAR | Status: DC | PRN
  Administered 2023-02-11: 1000 [IU] via INTRAVENOUS

## 2023-02-11 SURGICAL SUPPLY — 22 items
BAG SNAP BAND KOVER 36X36 (MISCELLANEOUS) IMPLANT
BLANKET WARM UNDERBOD FULL ACC (MISCELLANEOUS) ×1 IMPLANT
CATH 8FR REPROCESSED SOUNDSTAR (CATHETERS) ×1 IMPLANT
CATH 8FR SOUNDSTAR REPROCESSED (CATHETERS) IMPLANT
CATH ABLAT QDOT MICRO BI TC DF (CATHETERS) IMPLANT
CATH OCTARAY 2.0 F 3-3-3-3-3 (CATHETERS) IMPLANT
CATH PIGTAIL STEERABLE D1 8.7 (WIRE) IMPLANT
CATH S-M CIRCA TEMP PROBE (CATHETERS) IMPLANT
CATH WEBSTER BI DIR CS D-F CRV (CATHETERS) IMPLANT
CLOSURE MYNX CONTROL 6F/7F (Vascular Products) IMPLANT
CLOSURE PERCLOSE PROSTYLE (VASCULAR PRODUCTS) IMPLANT
COVER SWIFTLINK CONNECTOR (BAG) ×1 IMPLANT
PACK EP LATEX FREE (CUSTOM PROCEDURE TRAY) ×1
PACK EP LF (CUSTOM PROCEDURE TRAY) ×1 IMPLANT
PAD DEFIB RADIO PHYSIO CONN (PAD) ×1 IMPLANT
PATCH CARTO3 (PAD) IMPLANT
SHEATH CARTO VIZIGO SM CVD (SHEATH) IMPLANT
SHEATH PINNACLE 7F 10CM (SHEATH) IMPLANT
SHEATH PINNACLE 8F 10CM (SHEATH) IMPLANT
SHEATH PINNACLE 9F 10CM (SHEATH) IMPLANT
SHEATH PROBE COVER 6X72 (BAG) IMPLANT
TUBING SMART ABLATE COOLFLOW (TUBING) IMPLANT

## 2023-02-11 NOTE — Transfer of Care (Signed)
Immediate Anesthesia Transfer of Care Note  Patient: Erik Perez  Procedure(s) Performed: ATRIAL FIBRILLATION ABLATION  Patient Location: Cath Lab  Anesthesia Type:General  Level of Consciousness: drowsy and patient cooperative  Airway & Oxygen Therapy: Patient Spontanous Breathing and Patient connected to nasal cannula oxygen  Post-op Assessment: Report given to RN, Post -op Vital signs reviewed and stable, and Patient moving all extremities X 4  Post vital signs: Reviewed and stable  Last Vitals:  Vitals Value Taken Time  BP 138/67 02/11/23 1345  Temp 36.8 C 02/11/23 1339  Pulse 67 02/11/23 1353  Resp 8 02/11/23 1353  SpO2 96 % 02/11/23 1353  Vitals shown include unvalidated device data.  Last Pain:  Vitals:   02/11/23 1339  TempSrc: Temporal  PainSc:          Complications: There were no known notable events for this encounter.

## 2023-02-11 NOTE — Anesthesia Preprocedure Evaluation (Addendum)
Anesthesia Evaluation  Patient identified by MRN, date of birth, ID band Patient awake    Reviewed: Allergy & Precautions, NPO status , Patient's Chart, lab work & pertinent test results  Airway Mallampati: III  TM Distance: >3 FB Neck ROM: Limited    Dental no notable dental hx.    Pulmonary sleep apnea and Continuous Positive Airway Pressure Ventilation , former smoker   Pulmonary exam normal        Cardiovascular hypertension, Pt. on medications and Pt. on home beta blockers + CAD and + CABG   Rhythm:Regular Rate:Normal  ECHO 2023:  1. Left ventricular ejection fraction, by estimation, is 60 to 65%. Left  ventricular ejection fraction by 3D volume is 60 %. The left ventricle has  normal function. The left ventricle has no regional wall motion  abnormalities. Left ventricular diastolic   parameters were normal.   2. Right ventricular systolic function is normal. The right ventricular  size is normal. Tricuspid regurgitation signal is inadequate for assessing  PA pressure.   3. The mitral valve is normal in structure. No evidence of mitral valve  regurgitation. No evidence of mitral stenosis.   4. The aortic valve is tricuspid. Aortic valve regurgitation is not  visualized. Aortic valve sclerosis/calcification is present, without any  evidence of aortic stenosis.   5. The inferior vena cava is normal in size with greater than 50%  respiratory variability, suggesting right atrial pressure of 3 mmHg.      Neuro/Psych negative neurological ROS  negative psych ROS   GI/Hepatic negative GI ROS, Neg liver ROS,,,  Endo/Other  negative endocrine ROS    Renal/GU   negative genitourinary   Musculoskeletal   Abdominal Normal abdominal exam  (+)   Peds  Hematology negative hematology ROS (+) Lab Results      Component                Value               Date                      WBC                      5.2                  02/05/2023                HGB                      12.3 (L)            02/05/2023                HCT                      37.2 (L)            02/05/2023                MCV                      103 (H)             02/05/2023                PLT                      152  02/05/2023             Lab Results      Component                Value               Date                      NA                       138                 02/05/2023                K                        5.2                 02/05/2023                CO2                      22                  02/05/2023                GLUCOSE                  99                  02/05/2023                BUN                      41 (H)              02/05/2023                CREATININE               2.53 (H)            02/05/2023                CALCIUM                  9.2                 02/05/2023                EGFR                     25 (L)              02/05/2023                GFRNONAA                 17 (L)              09/29/2022              Anesthesia Other Findings   Reproductive/Obstetrics                             Anesthesia Physical Anesthesia Plan  ASA: 3  Anesthesia Plan: General   Post-op Pain Management:    Induction: Intravenous  PONV Risk Score and  Plan: 2 and Ondansetron, Dexamethasone, Midazolam and Treatment may vary due to age or medical condition  Airway Management Planned: Mask, Oral ETT and Video Laryngoscope Planned  Additional Equipment: None  Intra-op Plan:   Post-operative Plan: Extubation in OR  Informed Consent: I have reviewed the patients History and Physical, chart, labs and discussed the procedure including the risks, benefits and alternatives for the proposed anesthesia with the patient or authorized representative who has indicated his/her understanding and acceptance.     Dental advisory given  Plan Discussed with: CRNA  Anesthesia  Plan Comments:        Anesthesia Quick Evaluation

## 2023-02-11 NOTE — Progress Notes (Signed)
Patient began to ooze from R Groin site, without a hematoma. Bleeding stopped without pressure being held. Dressing changed. Camnitz, MD stated to extend bedrest for one hour. Orders followed. Will continue to monitor.

## 2023-02-11 NOTE — Interval H&P Note (Signed)
History and Physical Interval Note:  02/11/2023 10:52 AM  Erik Perez  has presented today for surgery, with the diagnosis of afib.  The various methods of treatment have been discussed with the patient and family. After consideration of risks, benefits and other options for treatment, the patient has consented to  Procedure(s): ATRIAL FIBRILLATION ABLATION (N/A) as a surgical intervention.  The patient's history has been reviewed, patient examined, no change in status, stable for surgery.  I have reviewed the patient's chart and labs.  Questions were answered to the patient's satisfaction.     Clayton Jarmon [employer]

## 2023-02-11 NOTE — Progress Notes (Signed)
Patient is in sinus rhythm, so will cancel TEE per Dr Elberta Fortis

## 2023-02-11 NOTE — Anesthesia Procedure Notes (Signed)
Procedure Name: Intubation Date/Time: 02/11/2023 11:37 AM  Performed by: Lonia Mad, CRNAPre-anesthesia Checklist: Patient identified, Emergency Drugs available, Suction available and Patient being monitored Patient Re-evaluated:Patient Re-evaluated prior to induction Oxygen Delivery Method: Circle system utilized Preoxygenation: Pre-oxygenation with 100% oxygen Induction Type: IV induction Ventilation: Mask ventilation without difficulty and Two handed mask ventilation required Laryngoscope Size: Mac and 4 Grade View: Grade II Tube type: Oral Tube size: 7.5 mm Number of attempts: 1 Airway Equipment and Method: Stylet and Oral airway Placement Confirmation: ETT inserted through vocal cords under direct vision, positive ETCO2 and breath sounds checked- equal and bilateral Secured at: 22 cm Tube secured with: Tape Dental Injury: Teeth and Oropharynx as per pre-operative assessment

## 2023-02-11 NOTE — Discharge Instructions (Signed)
Cardiac Ablation, Care After  This sheet gives you information about how to care for yourself after your procedure. Your health care provider may also give you more specific instructions. If you have problems or questions, contact your health care provider. What can I expect after the procedure? After the procedure, it is common to have: Bruising around your puncture site. Tenderness around your puncture site. Skipped heartbeats. If you had an atrial fibrillation ablation, you may have atrial fibrillation during the first several months after your procedure.  Tiredness (fatigue).  Follow these instructions at home: Puncture site care  Follow instructions from your health care provider about how to take care of your puncture site. Make sure you: If present, leave stitches (sutures), skin glue, or adhesive strips in place. These skin closures may need to stay in place for up to 2 weeks. If adhesive strip edges start to loosen and curl up, you may trim the loose edges. Do not remove adhesive strips completely unless your health care provider tells you to do that. If a large square bandage is present, this may be removed 24 hours after surgery.  Check your puncture site every day for signs of infection. Check for: Redness, swelling, or pain. Fluid or blood. If your puncture site starts to bleed, lie down on your back, apply firm pressure to the area, and contact your health care provider. Warmth. Pus or a bad smell. A pea or small marble sized lump at the site is normal and can take up to three months to resolve.  Driving Do not drive for at least 4 days after your procedure or however long your health care provider recommends. (Do not resume driving if you have previously been instructed not to drive for other health reasons.) Do not drive or use heavy machinery while taking prescription pain medicine. Activity Avoid activities that take a lot of effort for at least 7 days after your  procedure. Do not lift anything that is heavier than 5 lb (4.5 kg) for one week.  No sexual activity for 1 week.  Return to your normal activities as told by your health care provider. Ask your health care provider what activities are safe for you. General instructions Take over-the-counter and prescription medicines only as told by your health care provider. Do not use any products that contain nicotine or tobacco, such as cigarettes and e-cigarettes. If you need help quitting, ask your health care provider. You may shower after 24 hours, but Do not take baths, swim, or use a hot tub for 1 week.  Do not drink alcohol for 24 hours after your procedure. Keep all follow-up visits as told by your health care provider. This is important. Contact a health care provider if: You have redness, mild swelling, or pain around your puncture site. You have fluid or blood coming from your puncture site that stops after applying firm pressure to the area. Your puncture site feels warm to the touch. You have pus or a bad smell coming from your puncture site. You have a fever. You have chest pain or discomfort that spreads to your neck, jaw, or arm. You have chest pain that is worse with lying on your back or taking a deep breath. You are sweating a lot. You feel nauseous. You have a fast or irregular heartbeat. You have shortness of breath. You are dizzy or light-headed and feel the need to lie down. You have pain or numbness in the arm or leg closest to your puncture   site. Get help right away if: Your puncture site suddenly swells. Your puncture site is bleeding and the bleeding does not stop after applying firm pressure to the area. These symptoms may represent a serious problem that is an emergency. Do not wait to see if the symptoms will go away. Get medical help right away. Call your local emergency services (911 in the [place] Do not drive yourself to the hospital. Summary After the procedure, it  is normal to have bruising and tenderness at the puncture site in your groin, neck, or forearm. Check your puncture site every day for signs of infection. Get help right away if your puncture site is bleeding and the bleeding does not stop after applying firm pressure to the area. This is a medical emergency. This information is not intended to replace advice given to you by your health care provider. Make sure you discuss any questions you have with your health care provider.   You have an appointment set up with the [facility].  Multiple studies have shown that being followed by a dedicated atrial fibrillation clinic in addition to the standard care you receive from your other physicians improves health. We believe that enrollment in the atrial fibrillation clinic will allow us to better care for you.   The phone number to the [facility] is [phone]. The clinic is staffed Monday through Friday from 8:30am to 5pm.  Directions: The clinic is located in the [facility], 6TH FLOOR Enter the hospital at the MAIN ENTRANCE "A", use North Tower Elevators to the 6th floor.  Registration desk to the right of elevators on 6th floor  If you have any trouble locating the clinic, please don't hesitate to call [phone].   

## 2023-02-12 ENCOUNTER — Encounter (HOSPITAL_COMMUNITY): Payer: Self-pay | Admitting: Cardiology

## 2023-02-12 MED FILL — Fentanyl Citrate Preservative Free (PF) Inj 100 MCG/2ML: INTRAMUSCULAR | Qty: 2 | Status: AC

## 2023-02-12 NOTE — Anesthesia Postprocedure Evaluation (Signed)
Anesthesia Post Note  Patient: Erik Perez  Procedure(s) Performed: ATRIAL FIBRILLATION ABLATION     Patient location during evaluation: PACU Anesthesia Type: General Level of consciousness: awake and alert Pain management: pain level controlled Vital Signs Assessment: post-procedure vital signs reviewed and stable Respiratory status: spontaneous breathing, nonlabored ventilation, respiratory function stable and patient connected to nasal cannula oxygen Cardiovascular status: blood pressure returned to baseline and stable Postop Assessment: no apparent nausea or vomiting Anesthetic complications: no   There were no known notable events for this encounter.  Last Vitals:  Vitals:   02/11/23 1645 02/11/23 1700  BP: 139/67 139/61  Pulse: 71 69  Resp: 15 15  Temp:    SpO2: 97% 95%    Last Pain:  Vitals:   02/12/23 1005  TempSrc:   PainSc: 0-No pain                 Earl Lites P Ashawnti Tangen

## 2023-02-15 ENCOUNTER — Encounter: Payer: Self-pay | Admitting: Cardiology

## 2023-02-16 ENCOUNTER — Ambulatory Visit: Payer: Medicare PPO | Attending: Internal Medicine | Admitting: Pharmacist

## 2023-02-16 VITALS — BP 140/54 | HR 60

## 2023-02-16 DIAGNOSIS — I10 Essential (primary) hypertension: Secondary | ICD-10-CM | POA: Diagnosis not present

## 2023-02-16 MED ORDER — CARVEDILOL 3.125 MG PO TABS: 3.1250 mg | ORAL_TABLET | Freq: Two times a day (BID) | ORAL | 3 refills | Status: DC

## 2023-02-16 MED ORDER — AMIODARONE HCL 200 MG PO TABS: 200.0000 mg | ORAL_TABLET | Freq: Every morning | ORAL | 0 refills | Status: DC

## 2023-02-16 NOTE — Progress Notes (Signed)
Patient ID: [name]                 DOB: Nov 13, 1945                      MRN: [id number]      HPI: Erik Perez is a 77 y.o. male referred by Dr. Lynnette Perez to HTN clinic. PMH is significant for HTN, CAD s/p CABG, PAF and CKD stage 4.  Seen by Dr. Lynnette Perez at the end of February.  Patient was concerned with increasing blood pressure and he was referred to hypertension clinic.  Patient seen initially in hypertension clinic on 01/01/2023.  At that time amlodipine 5 mg daily was started and doxazosin was stopped.  Confirmed with nephrologist, Dr. Zetta Perez that he was okay with starting amlodipine.  Blood pressure readings were in the 150s to 160s /70s to 80s therefore amlodipine was increased to 10 mg daily.   At last visit 4/29, blood pressure was 142/60. Patient reported feeling better and walking more. No medication changes were made. He underwent afib ablation 5/15.   Patient presents today accompanied by his wife. He denies any dizziness other than a few days around the time of his ablation. No headaches. A small amount of swelling but has not been wearing his compression stocks the last few days. Home blood pressures readings range from 120-140's but mainly in the low 130's/60's. Has not been able to go for a walk for the last week due to ablation, but plans to resume tomorrow.   Other home cuff:150/65 Office:160/58 Office: 154/60 Home cuff:142/61 Office: manual 152/58 Clinic Automatic 142/60  Current HTN meds: Amlodipine 10 mg daily, metoprolol succinate 25mg  daily, lisinopril 40mg  daily Previously tried: amlodipine BP goal: <130/80  Family History:  Family History  Problem Relation Age of Onset   Heart disease Mother    Heart disease Father    Heart disease Brother     Social History: very little ETOH, former smoker  Diet:   Exercise: walking daily 20-30 min   Home BP readings:  122-143/59-65  Wt Readings from Last 3 Encounters:  02/11/23 207 lb (93.9  kg)  02/05/23 213 lb 3.2 oz (96.7 kg)  11/25/22 207 lb 6.4 oz (94.1 kg)   BP Readings from Last 3 Encounters:  02/16/23 (!) 140/54  02/11/23 139/61  02/05/23 (!) 140/62   Pulse Readings from Last 3 Encounters:  02/16/23 60  02/11/23 69  02/05/23 63    Renal function: Estimated Creatinine Clearance: 28.1 mL/min (A) (by C-G formula based on SCr of 2.53 mg/dL (H)).  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 (Patient not taking: Reported on 02/10/2023)     acetaminophen (TYLENOL) 500 MG tablet Take 500-1,000 mg by mouth every 6 (six) hours as needed (pain.).     allopurinol (ZYLOPRIM) 100 MG tablet Take 100 mg by mouth 2 (two) times daily.     amLODipine (NORVASC)  10 MG tablet Take 1 tablet (10 mg total) by mouth daily. 90 tablet 3   apixaban (ELIQUIS) 5 MG TABS tablet Take 1 tablet (5 mg total) by mouth 2 (two) times daily. 60 tablet 3   atorvastatin (LIPITOR) 40 MG tablet Take 40 mg by mouth every evening. (1900)     calcium gluconate 500 MG tablet Take 1-2 tablets by mouth See admin instructions. Take 2 tablets in the morning & take 1 tablet at night.     Cholecalciferol (VITAMIN D) 2000 UNITS tablet Take 2,000 Units by mouth 2 (two) times daily.      cyanocobalamin (VITAMIN B12) 1000 MCG/ML injection Inject 1,000 mcg into the muscle every 30 (thirty) days.     diltiazem (CARDIZEM) 30 MG tablet Take 1 tablet (30 mg total) by mouth 2 (two) times daily as needed (for heart rate greater than 110).     fexofenadine  (ALLEGRA) 180 MG tablet Take 180 mg by mouth in the morning.     furosemide (LASIX) 20 MG tablet Take 20 mg oral daily for next 5 days, then as needed for edema or fluid retention 30 tablet 0   lisinopril (ZESTRIL) 40 MG tablet Take 40 mg by mouth daily.     Multiple Vitamin (MULTIVITAMIN) tablet Take 1 tablet by mouth in the morning.     nitroGLYCERIN (NITROSTAT) 0.4 MG SL tablet Place 1 tablet (0.4 mg total) under the tongue every 5 (five) minutes as needed for chest pain. 25 tablet 1   NON FORMULARY Place 1 Application in ear(s) 2 (two) times a week. Chloramphenicol 50 mg /amphotericin B 5 mg (Tuesdays & Fridays)     Omega-3 Fatty Acids (FISH OIL) 1200 MG CAPS Take 1,200 mg by mouth 2 (two) times daily.     No current facility-administered medications on file prior to visit.    Allergies  Allergen Reactions   Colchicine Other (See Comments)    Unknown reaction.   Diclofenac Sodium     Chest Pain   Gabapentin     Increased sedation    Niaspan [Niacin Er]     Involuntary facial flushing    Blood pressure (!) 140/54, pulse 60, SpO2 95 %.   Assessment/Plan:  1. Hypertension -  Essential hypertension, benign Assessment: Blood pressure above goal of <130/80 in clinic and at home, but better controlled and more stable than prior Patient recovering well from afib ablation Exercising regularly Discussed hydralazie vs swapping metoprolol for carvedilol for alpha blockadge  Plan: STOP metoprolol START carvedilol 3.125mg  BID Continue amlodipine 10mg  daily and lisinopril 40mg  daily Follow up in 4 weeks    Thank you  Erik Perez, Pharm.D, BCPS, CPP [facility] [address] [place], [place] [address]  Phone: [phone]; Fax: [phone]

## 2023-02-16 NOTE — Assessment & Plan Note (Signed)
Assessment: Blood pressure above goal of <130/80 in clinic and at home, but better controlled and more stable than prior Patient recovering well from afib ablation Exercising regularly Discussed hydralazie vs swapping metoprolol for carvedilol for alpha blockadge  Plan: STOP metoprolol START carvedilol 3.125mg  BID Continue amlodipine 10mg  daily and lisinopril 40mg  daily Follow up in 4 weeks

## 2023-02-16 NOTE — Patient Instructions (Addendum)
Summary of today's discussion  STOP taking metoprolol  2. START carvedilol 3.125mg  twice a day  3. Continue amlodipine 10mg  daily and lisinopril 40mg  daily  4. Continue checking blood pressure daily  5.  Resuming walking tomorrow   Your blood pressure goal is <130/80  To check your pressure at home you will need to:  1. Sit up in a chair, with feet flat on the floor and back supported. Do not cross your ankles or legs. 2. Rest your left arm so that the cuff is about heart level. If the cuff goes on your upper arm,  then just relax the arm on the table, arm of the chair or your lap. If you have a wrist cuff, we  suggest relaxing your wrist against your chest (think of it as Pledging the Flag with the  wrong arm).  3. Place the cuff snugly around your arm, about 1 inch above the crook of your elbow. The  cords should be inside the groove of your elbow.  4. Sit quietly, with the cuff in place, for about 5 minutes. After that 5 minutes press the power  button to start a reading. 5. Do not talk or move while the reading is taking place.  6. Record your readings on a sheet of paper. Although most cuffs have a memory, it is often  easier to see a pattern developing when the numbers are all in front of you.  7. You can repeat the reading after 1-3 minutes if it is recommended  Make sure your bladder is empty and you have not had caffeine or tobacco within the last 30 min  Always bring your blood pressure log with you to your appointments. If you have not brought your monitor in to be double checked for accuracy, please bring it to your next appointment.  You can find a list of validated (accurate) blood pressure cuffs at [email or web address]   Important lifestyle changes to control high blood pressure  Intervention  Effect on the BP  Lose extra pounds and watch your waistline Weight loss is one of the most effective lifestyle changes for controlling blood pressure. If you're overweight or  obese, losing even a small amount of weight can help reduce blood pressure. Blood pressure might go down by about 1 millimeter of mercury (mm Hg) with each kilogram (about 2.2 pounds) of weight lost.  Exercise regularly As a general goal, aim for at least 30 minutes of moderate physical activity every day. Regular physical activity can lower high blood pressure by about 5 to 8 mm Hg.  Eat a healthy diet Eating a diet rich in whole grains, fruits, vegetables, and low-fat dairy products and low in saturated fat and cholesterol. A healthy diet can lower high blood pressure by up to 11 mm Hg.  Reduce salt (sodium) in your diet Even a small reduction of sodium in the diet can improve heart health and reduce high blood pressure by about 5 to 6 mm Hg.  Limit alcohol One drink equals 12 ounces of beer, 5 ounces of wine, or 1.5 ounces of 80-proof liquor.  Limiting alcohol to less than one drink a day for women or two drinks a day for men can help lower blood pressure by about 4 mm Hg.   Please call me at [phone] with any questions.

## 2023-02-18 DIAGNOSIS — E538 Deficiency of other specified B group vitamins: Secondary | ICD-10-CM | POA: Diagnosis not present

## 2023-02-19 DIAGNOSIS — N5201 Erectile dysfunction due to arterial insufficiency: Secondary | ICD-10-CM | POA: Diagnosis not present

## 2023-02-19 DIAGNOSIS — N281 Cyst of kidney, acquired: Secondary | ICD-10-CM | POA: Diagnosis not present

## 2023-02-19 DIAGNOSIS — C61 Malignant neoplasm of prostate: Secondary | ICD-10-CM | POA: Diagnosis not present

## 2023-02-24 DIAGNOSIS — D631 Anemia in chronic kidney disease: Secondary | ICD-10-CM | POA: Diagnosis not present

## 2023-02-24 DIAGNOSIS — I129 Hypertensive chronic kidney disease with stage 1 through stage 4 chronic kidney disease, or unspecified chronic kidney disease: Secondary | ICD-10-CM | POA: Diagnosis not present

## 2023-02-24 DIAGNOSIS — N2581 Secondary hyperparathyroidism of renal origin: Secondary | ICD-10-CM | POA: Diagnosis not present

## 2023-02-24 DIAGNOSIS — N184 Chronic kidney disease, stage 4 (severe): Secondary | ICD-10-CM | POA: Diagnosis not present

## 2023-03-03 ENCOUNTER — Encounter: Payer: Self-pay | Admitting: Pharmacist

## 2023-03-11 ENCOUNTER — Ambulatory Visit (HOSPITAL_COMMUNITY)
Admission: RE | Admit: 2023-03-11 | Discharge: 2023-03-11 | Disposition: A | Discharge status: Home / Self Care | Payer: Medicare PPO | Source: Ambulatory Visit | Attending: Internal Medicine | Admitting: Internal Medicine

## 2023-03-11 ENCOUNTER — Encounter (HOSPITAL_COMMUNITY): Payer: Self-pay | Admitting: Internal Medicine

## 2023-03-11 VITALS — BP 134/62 | HR 63 | Ht 71.0 in | Wt 207.8 lb

## 2023-03-11 DIAGNOSIS — D6869 Other thrombophilia: Secondary | ICD-10-CM | POA: Insufficient documentation

## 2023-03-11 DIAGNOSIS — I2510 Atherosclerotic heart disease of native coronary artery without angina pectoris: Secondary | ICD-10-CM | POA: Insufficient documentation

## 2023-03-11 DIAGNOSIS — Z8546 Personal history of malignant neoplasm of prostate: Secondary | ICD-10-CM | POA: Insufficient documentation

## 2023-03-11 DIAGNOSIS — Z7901 Long term (current) use of anticoagulants: Secondary | ICD-10-CM | POA: Insufficient documentation

## 2023-03-11 DIAGNOSIS — Z6828 Body mass index (BMI) 28.0-28.9, adult: Secondary | ICD-10-CM | POA: Diagnosis not present

## 2023-03-11 DIAGNOSIS — I4819 Other persistent atrial fibrillation: Secondary | ICD-10-CM | POA: Diagnosis not present

## 2023-03-11 DIAGNOSIS — I10 Essential (primary) hypertension: Secondary | ICD-10-CM | POA: Diagnosis not present

## 2023-03-11 DIAGNOSIS — Z951 Presence of aortocoronary bypass graft: Secondary | ICD-10-CM | POA: Diagnosis not present

## 2023-03-11 DIAGNOSIS — Z87891 Personal history of nicotine dependence: Secondary | ICD-10-CM | POA: Insufficient documentation

## 2023-03-11 DIAGNOSIS — Z8249 Family history of ischemic heart disease and other diseases of the circulatory system: Secondary | ICD-10-CM | POA: Insufficient documentation

## 2023-03-11 DIAGNOSIS — G4733 Obstructive sleep apnea (adult) (pediatric): Secondary | ICD-10-CM | POA: Insufficient documentation

## 2023-03-11 DIAGNOSIS — E669 Obesity, unspecified: Secondary | ICD-10-CM | POA: Diagnosis not present

## 2023-03-11 NOTE — Progress Notes (Signed)
Primary Care Physician: Jackelyn Poling, DO Primary Cardiologist: Dr Katrinka Blazing Primary Electrophysiologist: none Referring Physician: [facility]   Erik Perez is a 77 y.o. male with a history of CAD s/p CABG, HTN, OSA, prostate CA, HLD, carotid artery disease, atrial fibrillation who presents for consultation in the [facility].  The patient was initially diagnosed with atrial fibrillation 07/10/22 after presenting to the ED with symptoms of tachypalpitations and chest pain. ECG showed new onset afib. His home metoprolol was increased for rate control and he was started on Eliquis for a CHADS2VASC score of 4. Patient reports compliance with his CPAP and denies significant alcohol use. There were no specific triggers for his afib that he could identify. He is back in SR today.   F/u visit form ED 08/20/22. He presented to the ED with onset of afib with chest pain. He was compliant with BB and eliquis. H is currently wearing a heart monitor and is scheduled for a stress test as outpt 09/05/22. He did have a successful cardioversion and cardiology recommend starting him on po amiodarone.AAD choices are limited 2/2 CKD. He is compliant with CPAP, no alcohol use.   In the afib clinic, he is in SR and is continuing on amiodarone 400 mg bid and will go to 400 mg a day per instruction tomorrow. I am changing this to 1/2 tab of 400 mg bid for 2 more  weeks then go to 200 mg daily.  He has f/u with Robin Searing, NP, 12/7. We discussed he may be an ablation candidate so he will not have to be on amiodarone long term and will request appointment.   On follow up 03/11/23, he is currently in NSR. He is s/p Afib ablation by Dr. Elberta Fortis on 02/11/23. He denies any chest pain or shortness of breath or trouble swallowing. He has not had any episodes of Afib since ablation. Leg sites healed without issue. Currently on amiodarone 200 mg daily. No missed doses of Eliquis.   Today, he  denies symptoms of palpitations, orthopnea, PND, lower extremity edema, dizziness, presyncope, syncope, bleeding, or neurologic sequela. The patient is tolerating medications without difficulties and is otherwise without complaint today.    Atrial Fibrillation Risk Factors:  he does have symptoms or diagnosis of sleep apnea. he is compliant with CPAP therapy. he does not have a history of rheumatic fever. he does not have a history of alcohol use.   he has a BMI of Body mass index is 28.98 kg/m.Marland Kitchen Filed Weights   03/11/23 1129  Weight: 94.3 kg     Family History  Problem Relation Age of Onset   Heart disease Mother    Heart disease Father    Heart disease Brother      Atrial Fibrillation Management history:  Previous antiarrhythmic drugs: none Previous cardioversions: none Previous ablations: none CHADS2VASC score: 4 Anticoagulation history: Eliquis   Past Medical History:  Diagnosis Date   Carotid artery obstruction    Bilateral moderate carotid obstruction 2013 doppler   Coronary artery disease    Hyperlipidemia    Hypertension    OSA (obstructive sleep apnea)    Split 02-08-07 AHI total 32/hr; REM 60/hr O2 sat min NREM 88% REM 88%   Past Surgical History:  Procedure Laterality Date   ATRIAL FIBRILLATION ABLATION N/A 02/11/2023   Procedure: ATRIAL FIBRILLATION ABLATION;  Surgeon: Regan Lemming, MD;  Location: [facility] INVASIVE CV LAB;  Service: Cardiovascular;  Laterality: N/A;  CERVICAL DISC SURGERY  2009   CHOLECYSTECTOMY N/A 09/23/2022   Procedure: LAPAROSCOPIC CHOLECYSTECTOMY;  Surgeon: Stechschulte, Hyman Hopes, MD;  Location: [facility] OR;  Service: General;  Laterality: N/A;   CORONARY ARTERY BYPASS GRAFT     [place], to LAD, RIMA to RCA 2000,Cfx. Taxus stent 2002    Current Outpatient Medications  Medication Sig Dispense Refill   acetaminophen (TYLENOL) 500 MG tablet Take 500-1,000 mg by mouth every 6 (six) hours as needed (pain.).     allopurinol (ZYLOPRIM) 100  MG tablet Take 100 mg by mouth 2 (two) times daily.     amiodarone (PACERONE) 200 MG tablet Take 1 tablet (200 mg total) by mouth in the morning. 90 tablet 0   amLODipine (NORVASC) 10 MG tablet Take 1 tablet (10 mg total) by mouth daily. 90 tablet 3   apixaban (ELIQUIS) 5 MG TABS tablet Take 1 tablet (5 mg total) by mouth 2 (two) times daily. 60 tablet 3   atorvastatin (LIPITOR) 40 MG tablet Take 40 mg by mouth every evening. (1900)     calcium gluconate 500 MG tablet Take 2 tablets in the morning & take 1 tablet at night.     carvedilol (COREG) 3.125 MG tablet Take 1 tablet (3.125 mg total) by mouth 2 (two) times daily. 180 tablet 3   Cholecalciferol (VITAMIN D) 2000 UNITS tablet Take 2,000 Units by mouth 2 (two) times daily.      cyanocobalamin (VITAMIN B12) 1000 MCG/ML injection Inject 1,000 mcg into the muscle every 30 (thirty) days.     diltiazem (CARDIZEM) 30 MG tablet Take 1 tablet (30 mg total) by mouth 2 (two) times daily as needed (for heart rate greater than 110).     fexofenadine (ALLEGRA) 180 MG tablet Take 180 mg by mouth in the morning.     furosemide (LASIX) 20 MG tablet Take 20 mg oral daily for next 5 days, then as needed for edema or fluid retention 30 tablet 0   lisinopril (ZESTRIL) 40 MG tablet Take 40 mg by mouth in the morning.     Multiple Vitamin (MULTIVITAMIN) tablet Take 1 tablet by mouth in the morning.     nitroGLYCERIN (NITROSTAT) 0.4 MG SL tablet Place 1 tablet (0.4 mg total) under the tongue every 5 (five) minutes as needed for chest pain. 25 tablet 1   NON FORMULARY Place 1 Application in ear(s) 2 (two) times a week. Chloramphenicol 50 mg /amphotericin B 5 mg (Tuesdays & Fridays)     Omega-3 Fatty Acids (FISH OIL) 1200 MG CAPS Take 1,200 mg by mouth 2 (two) times daily.     No current facility-administered medications for this encounter.    Allergies  Allergen Reactions   Colchicine Other (See Comments)    Unknown reaction.   Diclofenac Sodium     Chest Pain    Gabapentin     Increased sedation    Niaspan [Niacin Er]     Involuntary facial flushing    Social History   Socioeconomic History   Marital status: Married    Spouse name: Not on file   Number of children: Not on file   Years of education: Not on file   Highest education level: Not on file  Occupational History   Not on file  Tobacco Use   Smoking status: Former    Packs/day: 0.33    Years: 23.00    Additional pack years: 0.00    Total pack years: 7.59    Types: Cigarettes  Start date: 13    Quit date: 1985    Years since quitting: 39.4   Smokeless tobacco: Never   Tobacco comments:    Former smoker 07/17/22  Vaping Use   Vaping Use: Never used  Substance and Sexual Activity   Alcohol use: Yes    Comment: social   Drug use: No   Sexual activity: Not on file  Other Topics Concern   Not on file  Social History Narrative   Not on file   Social Determinants of Health   Financial Resource Strain: Not on file  Food Insecurity: No Food Insecurity (09/21/2022)   Hunger Vital Sign    Worried About Running Out of Food in the Last Year: Never true    Ran Out of Food in the Last Year: Never true  Transportation Needs: No Transportation Needs (09/21/2022)   PRAPARE - Administrator, Civil Service (Medical): No    Lack of Transportation (Non-Medical): No  Physical Activity: Not on file  Stress: Not on file  Social Connections: Not on file  Intimate Partner Violence: Not At Risk (09/21/2022)   Humiliation, Afraid, Rape, and Kick questionnaire    Fear of Current or Ex-Partner: No    Emotionally Abused: No    Physically Abused: No    Sexually Abused: No     ROS- All systems are reviewed and negative except as per the HPI above.  Physical Exam: Vitals:   03/11/23 1129  BP: 134/62  Pulse: 63  Weight: 94.3 kg  Height: 5\' 11"  (1.803 m)    GEN- The patient is well appearing, alert and oriented x 3 today.   Head- normocephalic,  atraumatic Eyes-  Sclera clear, conjunctiva pink Ears- hearing intact Lungs- Clear to ausculation bilaterally, normal work of breathing Heart- Regular rate and rhythm, no murmurs, rubs or gallops, PMI not laterally displaced Extremities- no clubbing, cyanosis, or edema MS- no significant deformity or atrophy Skin- no rash or lesion Psych- euthymic mood, full affect Neuro- strength and sensation are intact   Wt Readings from Last 3 Encounters:  03/11/23 94.3 kg  02/11/23 93.9 kg  02/05/23 96.7 kg    EKG today demonstrates  NSR HR 63 PR 110 ms QRS 104 ms QT/Qtc 452/462 ms  Echo 04/11/22 demonstrated   1. Left ventricular ejection fraction, by estimation, is 60 to 65%. Left  ventricular ejection fraction by 3D volume is 60 %. The left ventricle has normal function. The left ventricle has no regional wall motion  abnormalities. Left ventricular diastolic parameters were normal.   2. Right ventricular systolic function is normal. The right ventricular  size is normal. Tricuspid regurgitation signal is inadequate for assessing PA pressure.   3. The mitral valve is normal in structure. No evidence of mitral valve  regurgitation. No evidence of mitral stenosis.   4. The aortic valve is tricuspid. Aortic valve regurgitation is not  visualized. Aortic valve sclerosis/calcification is present, without any  evidence of aortic stenosis.   5. The inferior vena cava is normal in size with greater than 50%  respiratory variability, suggesting right atrial pressure of 3 mmHg.   Epic records are reviewed at length today  CHA2DS2-VASc Score = 4  The patient's score is based upon: CHF History: 0 HTN History: 1 Diabetes History: 0 Stroke History: 0 Vascular Disease History: 1 Age Score: 2 Gender Score: 0      ASSESSMENT AND PLAN: 1. Persistent Atrial Fibrillation (ICD10:  I48.0) The patient's CHA2DS2-VASc score is  4, indicating a 4.8% annual risk of stroke.    Pt is in NSR s/p Afib  ablation by Dr. Elberta Fortis on 02/11/23.  Continue current medications without change. He has some intermittent lightheadedness which he is not sure attributable to recent HTN med change or amiodarone. We can consider decreasing amiodarone to 100 mg daily after his visit next week but hopefully can avoid if possible.   2. Secondary Hypercoagulable State (ICD10:  D68.69) The patient is at significant risk for stroke/thromboembolism based upon his CHA2DS2-VASc Score of 4.  Continue Apixaban (Eliquis).  No missed doses.  3. Obesity Body mass index is 28.98 kg/m. Lifestyle modification was discussed at length including regular exercise and weight reduction.  4. Obstructive sleep apnea Patient reports compliance with CPAP therapy.  5. CAD S/p CABG  6. HTN Stable, no changes today.   Follow up with Dr. Elberta Fortis as scheduled.  Lake Bells, PA-C Afib Clinic [facility] [address] [place], [place] [address] [phone]

## 2023-03-13 ENCOUNTER — Encounter: Payer: Self-pay | Admitting: Internal Medicine

## 2023-03-14 ENCOUNTER — Other Ambulatory Visit (HOSPITAL_COMMUNITY): Payer: Self-pay | Admitting: Physician Assistant

## 2023-03-16 ENCOUNTER — Ambulatory Visit: Payer: Medicare PPO | Attending: Internal Medicine | Admitting: Pharmacist

## 2023-03-16 ENCOUNTER — Encounter: Payer: Self-pay | Admitting: Pharmacist

## 2023-03-16 VITALS — BP 140/58 | HR 72

## 2023-03-16 DIAGNOSIS — I10 Essential (primary) hypertension: Secondary | ICD-10-CM | POA: Diagnosis not present

## 2023-03-16 DIAGNOSIS — Z8669 Personal history of other diseases of the nervous system and sense organs: Secondary | ICD-10-CM | POA: Diagnosis not present

## 2023-03-16 DIAGNOSIS — H903 Sensorineural hearing loss, bilateral: Secondary | ICD-10-CM | POA: Diagnosis not present

## 2023-03-16 MED ORDER — CARVEDILOL 6.25 MG PO TABS: 6.2500 mg | ORAL_TABLET | Freq: Two times a day (BID) | ORAL | 3 refills | Status: DC

## 2023-03-16 NOTE — Progress Notes (Signed)
Patient ID: [name]                 DOB: 1946/05/03                      MRN: [id number]      HPI: Erik Perez is a 77 y.o. male referred by Dr. Lynnette Perez to HTN clinic. PMH is significant for HTN, CAD s/p CABG, PAF and CKD stage 4.  Seen by Dr. Lynnette Perez at the end of February.  Patient was concerned with increasing blood pressure and he was referred to hypertension clinic.  Patient seen initially in hypertension clinic on 01/01/2023.  At that time amlodipine 5 mg daily was started and doxazosin was stopped.  Confirmed with nephrologist, Dr. Zetta Perez that he was okay with starting amlodipine.  Blood pressure readings were in the 150s to 160s /70s to 80s therefore amlodipine was increased to 10 mg daily.   At visit 4/29, blood pressure was 142/60. Patient reported feeling better and walking more. No medication changes were made. He underwent afib ablation 5/15.   Patient was last seen 02/16/23. Blood pressure in clinic was 140/54. Home readings were mainly in the low 130's/60's. Metoprolol was stopped and replaced with carvedilol 3.125mg  BID. Home cuff previously found to be accurate.  Patient presents today to clinic accompanied by his wife. He denies any dizziness, lightheadedness. He says that he feels like he leans sometimes and he brings his walker on longer walks because sometimes his legs are weak. Home readings are improved. Mainly high 120's-mid 130's/60-70's.  Current HTN meds: Amlodipine 10 mg daily, carvedilol 3.125mg  twice a day, lisinopril 40mg  daily Previously tried: amlodipine BP goal: <130/80  Family History:  Family History  Problem Relation Age of Onset   Heart disease Mother    Heart disease Father    Heart disease Brother     Social History: very little ETOH, former smoker  Diet:   Exercise: walking daily 20-30 min   Home BP readings:  High 120's-mid 130's/60-70's HR 64-79  Wt Readings from Last 3 Encounters:  03/11/23 207 lb 12.8 oz (94.3  kg)  02/11/23 207 lb (93.9 kg)  02/05/23 213 lb 3.2 oz (96.7 kg)   BP Readings from Last 3 Encounters:  03/16/23 (!) 140/58  03/11/23 134/62  02/16/23 (!) 140/54   Pulse Readings from Last 3 Encounters:  03/16/23 72  03/11/23 63  02/16/23 60    Renal function: CrCl cannot be calculated (Patient's most recent lab result is older than the maximum 21 days allowed.).  Past Medical History:  Diagnosis Date   Carotid artery obstruction    Bilateral moderate carotid obstruction 2013 doppler   Coronary artery disease    Hyperlipidemia    Hypertension    OSA (obstructive sleep apnea)    Split 02-08-07 AHI total 32/hr; REM 60/hr O2 sat min NREM 88% REM 88%    Current Outpatient Medications on File Prior to Visit  Medication Sig Dispense Refill   acetaminophen (TYLENOL) 500 MG tablet Take 500-1,000 mg by mouth every 6 (six) hours as needed (pain.).     allopurinol (ZYLOPRIM) 100 MG tablet Take 100 mg by mouth 2 (two) times daily.     amiodarone (PACERONE) 200 MG tablet Take 1 tablet (200 mg total) by mouth in the morning. 90 tablet 0   amLODipine (NORVASC) 10 MG tablet Take 1 tablet (10 mg total) by mouth daily. 90 tablet 3   apixaban (ELIQUIS) 5  MG TABS tablet TAKE 1 TABLET BY MOUTH TWICE A DAY 60 tablet 7   atorvastatin (LIPITOR) 40 MG tablet Take 40 mg by mouth every evening. (1900)     calcium gluconate 500 MG tablet Take 2 tablets in the morning & take 1 tablet at night.     Cholecalciferol (VITAMIN D) 2000 UNITS tablet Take 2,000 Units by mouth 2 (two) times daily.      cyanocobalamin (VITAMIN B12) 1000 MCG/ML injection Inject 1,000 mcg into the muscle every 30 (thirty) days.     diltiazem (CARDIZEM) 30 MG tablet Take 1 tablet (30 mg total) by mouth 2 (two) times daily as needed (for heart rate greater than 110).     fexofenadine (ALLEGRA) 180 MG tablet Take 180 mg by mouth in the morning.     furosemide (LASIX) 20 MG tablet Take 20 mg oral daily for next 5 days, then as needed  for edema or fluid retention 30 tablet 0   lisinopril (ZESTRIL) 40 MG tablet Take 40 mg by mouth in the morning.     Multiple Vitamin (MULTIVITAMIN) tablet Take 1 tablet by mouth in the morning.     nitroGLYCERIN (NITROSTAT) 0.4 MG SL tablet Place 1 tablet (0.4 mg total) under the tongue every 5 (five) minutes as needed for chest pain. 25 tablet 1   NON FORMULARY Place 1 Application in ear(s) 2 (two) times a week. Chloramphenicol 50 mg /amphotericin B 5 mg (Tuesdays & Fridays)     Omega-3 Fatty Acids (FISH OIL) 1200 MG CAPS Take 1,200 mg by mouth 2 (two) times daily.     No current facility-administered medications on file prior to visit.    Allergies  Allergen Reactions   Colchicine Other (See Comments)    Unknown reaction.   Diclofenac Sodium     Chest Pain   Gabapentin     Increased sedation    Niaspan [Niacin Er]     Involuntary facial flushing    Blood pressure (!) 140/58, pulse 72.   Assessment/Plan:  1. Hypertension -  Essential hypertension, benign Assessment: Blood pressure is improved and close to goal of <130/80 A little higher in clinic than at home HR mainly 70's Tolerating new medication (carvedilol) well  Plan: Increase carvedilol to 6.25mg  BID Continue amlodipine 10mg  daily and lisinopril 40mg  daily Follow up in 4 weeks   Thank you,  Olene Floss, Pharm.D, BCACP, BCPS, CPP [facility] A Division of [facility] [address], [place], [place] [address]  Phone: [phone]; Fax: [phone]

## 2023-03-16 NOTE — Assessment & Plan Note (Signed)
Assessment: Blood pressure is improved and close to goal of <130/80 A little higher in clinic than at home HR mainly 70's Tolerating new medication (carvedilol) well  Plan: Increase carvedilol to 6.25mg  BID Continue amlodipine 10mg  daily and lisinopril 40mg  daily Follow up in 4 weeks

## 2023-03-16 NOTE — Patient Instructions (Addendum)
Please increase carvedilol to 6.25mg  twice a day Continue amlodipine 10mg  daily and lisinopril 40mg  daily  Continue to check blood pressure at home Please call me with any issues [phone]

## 2023-03-18 DIAGNOSIS — E538 Deficiency of other specified B group vitamins: Secondary | ICD-10-CM | POA: Diagnosis not present

## 2023-03-28 DIAGNOSIS — M25562 Pain in left knee: Secondary | ICD-10-CM | POA: Diagnosis not present

## 2023-04-03 ENCOUNTER — Ambulatory Visit: Payer: Medicare PPO | Admitting: Internal Medicine

## 2023-04-09 ENCOUNTER — Ambulatory Visit: Payer: Medicare PPO | Admitting: Internal Medicine

## 2023-04-09 DIAGNOSIS — C61 Malignant neoplasm of prostate: Secondary | ICD-10-CM | POA: Diagnosis not present

## 2023-04-09 DIAGNOSIS — D6869 Other thrombophilia: Secondary | ICD-10-CM | POA: Diagnosis not present

## 2023-04-09 DIAGNOSIS — D510 Vitamin B12 deficiency anemia due to intrinsic factor deficiency: Secondary | ICD-10-CM | POA: Diagnosis not present

## 2023-04-09 DIAGNOSIS — D696 Thrombocytopenia, unspecified: Secondary | ICD-10-CM | POA: Diagnosis not present

## 2023-04-09 DIAGNOSIS — N184 Chronic kidney disease, stage 4 (severe): Secondary | ICD-10-CM | POA: Diagnosis not present

## 2023-04-09 DIAGNOSIS — I4891 Unspecified atrial fibrillation: Secondary | ICD-10-CM | POA: Diagnosis not present

## 2023-04-09 DIAGNOSIS — Z6828 Body mass index (BMI) 28.0-28.9, adult: Secondary | ICD-10-CM | POA: Diagnosis not present

## 2023-04-09 DIAGNOSIS — E7800 Pure hypercholesterolemia, unspecified: Secondary | ICD-10-CM | POA: Diagnosis not present

## 2023-04-13 ENCOUNTER — Ambulatory Visit: Payer: Medicare PPO | Attending: Cardiovascular Disease | Admitting: Pharmacist

## 2023-04-13 VITALS — BP 130/50 | HR 64

## 2023-04-13 DIAGNOSIS — I10 Essential (primary) hypertension: Secondary | ICD-10-CM | POA: Diagnosis not present

## 2023-04-13 NOTE — Progress Notes (Signed)
Patient ID: [name]                 DOB: Mar 23, 1946                      MRN: [id number]      HPI: Erik Perez is a 77 y.o. male referred by Dr. Lynnette Perez to HTN clinic. PMH is significant for HTN, CAD s/p CABG, PAF and CKD stage 4.  Seen by Dr. Lynnette Perez at the end of February.  Patient was concerned with increasing blood pressure and he was referred to hypertension clinic.  Patient seen initially in hypertension clinic on 01/01/2023.  At that time amlodipine 5 mg daily was started and doxazosin was stopped.  Confirmed with nephrologist, Dr. Zetta Perez that he was okay with starting amlodipine.  Blood pressure readings were in the 150s to 160s /70s to 80s therefore amlodipine was increased to 10 mg daily.   At visit 4/29, blood pressure was 142/60. Patient reported feeling better and walking more. No medication changes were made. He underwent afib ablation 5/15.   Patient was seen 02/16/23. Blood pressure in clinic was 140/54. Home readings were mainly in the low 130's/60's. Metoprolol was stopped and replaced with carvedilol 3.125mg  BID. Carvedilol was increased to 6.25mg  BID at follow up visit 6/17. Home cuff previously found to be accurate.  Patient presents today to clinic accompanied by his wife. He denies any dizziness, lightheadedness. Feels like his legs are stronger and using the walker less frequently. Some LEE on exam, but patient states it is not bothersome. Weight has been stable. He brings in home blood pressure readings. They are mostly in the 120's/60-70's.   Current HTN meds: Amlodipine 10 mg daily, carvedilol 6.25mg  twice a day, lisinopril 40mg  daily Previously tried: amlodipine BP goal: <130/80  Family History:  Family History  Problem Relation Age of Onset   Heart disease Mother    Heart disease Father    Heart disease Brother     Social History: very little ETOH, former smoker  Exercise: walking daily 20-30 min   Home BP readings:  120's/60-70's  HR 60-70's  Wt Readings from Last 3 Encounters:  03/11/23 207 lb 12.8 oz (94.3 kg)  02/11/23 207 lb (93.9 kg)  02/05/23 213 lb 3.2 oz (96.7 kg)   BP Readings from Last 3 Encounters:  04/13/23 (!) 130/50  03/16/23 (!) 140/58  03/11/23 134/62   Pulse Readings from Last 3 Encounters:  04/13/23 64  03/16/23 72  03/11/23 63    Renal function: CrCl cannot be calculated (Patient's most recent lab result is older than the maximum 21 days allowed.).  Past Medical History:  Diagnosis Date   Carotid artery obstruction    Bilateral moderate carotid obstruction 2013 doppler   Coronary artery disease    Hyperlipidemia    Hypertension    OSA (obstructive sleep apnea)    Split 02-08-07 AHI total 32/hr; REM 60/hr O2 sat min NREM 88% REM 88%    Current Outpatient Medications on File Prior to Visit  Medication Sig Dispense Refill   acetaminophen (TYLENOL) 500 MG tablet Take 500-1,000 mg by mouth every 6 (six) hours as needed (pain.).     allopurinol (ZYLOPRIM) 100 MG tablet Take 100 mg by mouth 2 (two) times daily.     amiodarone (PACERONE) 200 MG tablet Take 1 tablet (200 mg total) by mouth in the morning. 90 tablet 0   amLODipine (NORVASC) 10 MG tablet Take 1  tablet (10 mg total) by mouth daily. 90 tablet 3   apixaban (ELIQUIS) 5 MG TABS tablet TAKE 1 TABLET BY MOUTH TWICE A DAY 60 tablet 7   atorvastatin (LIPITOR) 40 MG tablet Take 40 mg by mouth every evening. (1900)     calcium gluconate 500 MG tablet Take 2 tablets in the morning & take 1 tablet at night.     carvedilol (COREG) 6.25 MG tablet Take 1 tablet (6.25 mg total) by mouth 2 (two) times daily. 180 tablet 3   Cholecalciferol (VITAMIN D) 2000 UNITS tablet Take 2,000 Units by mouth 2 (two) times daily.      cyanocobalamin (VITAMIN B12) 1000 MCG/ML injection Inject 1,000 mcg into the muscle every 30 (thirty) days.     diltiazem (CARDIZEM) 30 MG tablet Take 1 tablet (30 mg total) by mouth 2 (two) times daily as needed (for heart rate  greater than 110).     fexofenadine (ALLEGRA) 180 MG tablet Take 180 mg by mouth in the morning.     furosemide (LASIX) 20 MG tablet Take 20 mg oral daily for next 5 days, then as needed for edema or fluid retention 30 tablet 0   lisinopril (ZESTRIL) 40 MG tablet Take 40 mg by mouth in the morning.     Multiple Vitamin (MULTIVITAMIN) tablet Take 1 tablet by mouth in the morning.     nitroGLYCERIN (NITROSTAT) 0.4 MG SL tablet Place 1 tablet (0.4 mg total) under the tongue every 5 (five) minutes as needed for chest pain. 25 tablet 1   NON FORMULARY Place 1 Application in ear(s) 2 (two) times a week. Chloramphenicol 50 mg /amphotericin B 5 mg (Tuesdays & Fridays)     Omega-3 Fatty Acids (FISH OIL) 1200 MG CAPS Take 1,200 mg by mouth 2 (two) times daily.     No current facility-administered medications on file prior to visit.    Allergies  Allergen Reactions   Colchicine Other (See Comments)    Unknown reaction.   Diclofenac Sodium     Chest Pain   Gabapentin     Increased sedation    Niaspan [Niacin Er]     Involuntary facial flushing    Blood pressure (!) 130/50, pulse 64.   Assessment/Plan:  1. Hypertension -  Essential hypertension, benign Assessment: Blood pressure is at goal in clinic BP consistently at goal at home Denies any s/sx of low bp  Plan: Continue amlodipine 10 mg daily, carvedilol 6.25mg  twice a day, lisinopril 40mg  daily Follow up as needed    Thank you,  Erik Perez, Pharm.D, BCACP, BCPS, CPP [facility] of [facility] [address] [place], [place] [address]  Phone: [phone]; Fax: [phone]

## 2023-04-13 NOTE — Assessment & Plan Note (Signed)
Assessment: Blood pressure is at goal in clinic BP consistently at goal at home Denies any s/sx of low bp  Plan: Continue amlodipine 10 mg daily, carvedilol 6.25mg  twice a day, lisinopril 40mg  daily Follow up as needed

## 2023-04-13 NOTE — Patient Instructions (Addendum)
Continue amlodipine 10 mg daily, carvedilol 6.25mg  twice a day and lisinopril 40mg  daily  Please call me at [phone]

## 2023-04-15 ENCOUNTER — Encounter (INDEPENDENT_AMBULATORY_CARE_PROVIDER_SITE_OTHER): Payer: Medicare PPO | Admitting: Ophthalmology

## 2023-04-15 DIAGNOSIS — E538 Deficiency of other specified B group vitamins: Secondary | ICD-10-CM | POA: Diagnosis not present

## 2023-05-02 ENCOUNTER — Other Ambulatory Visit: Payer: Self-pay | Admitting: Cardiology

## 2023-05-07 ENCOUNTER — Encounter (INDEPENDENT_AMBULATORY_CARE_PROVIDER_SITE_OTHER): Payer: Medicare PPO | Admitting: Ophthalmology

## 2023-05-07 DIAGNOSIS — H35371 Puckering of macula, right eye: Secondary | ICD-10-CM

## 2023-05-07 DIAGNOSIS — H43813 Vitreous degeneration, bilateral: Secondary | ICD-10-CM | POA: Diagnosis not present

## 2023-05-07 DIAGNOSIS — H353122 Nonexudative age-related macular degeneration, left eye, intermediate dry stage: Secondary | ICD-10-CM

## 2023-05-07 DIAGNOSIS — H348312 Tributary (branch) retinal vein occlusion, right eye, stable: Secondary | ICD-10-CM | POA: Diagnosis not present

## 2023-05-07 DIAGNOSIS — H35033 Hypertensive retinopathy, bilateral: Secondary | ICD-10-CM

## 2023-05-07 DIAGNOSIS — I10 Essential (primary) hypertension: Secondary | ICD-10-CM

## 2023-05-13 DIAGNOSIS — E538 Deficiency of other specified B group vitamins: Secondary | ICD-10-CM | POA: Diagnosis not present

## 2023-05-18 DIAGNOSIS — H6123 Impacted cerumen, bilateral: Secondary | ICD-10-CM | POA: Diagnosis not present

## 2023-05-22 NOTE — Progress Notes (Unsigned)
Office Visit    Patient Name: Erik Perez Date of Encounter: 05/22/2023  Primary Care Provider:  Jackelyn Poling, DO Primary Cardiologist:  Erik Noe, MD (Inactive) Primary Electrophysiologist: Erik Jorja Loa, MD   Past Medical History    Past Medical History:  Diagnosis Date   Carotid artery obstruction    Bilateral moderate carotid obstruction 2013 doppler   Coronary artery disease    Hyperlipidemia    Hypertension    OSA (obstructive sleep apnea)    Split 02-08-07 AHI total 32/hr; REM 60/hr O2 sat min NREM 88% REM 88%   Past Surgical History:  Procedure Laterality Date   ATRIAL FIBRILLATION ABLATION N/A 02/11/2023   Procedure: ATRIAL FIBRILLATION ABLATION;  Surgeon: Erik Lemming, MD;  Location: [facility];  Service: Cardiovascular;  Laterality: N/A;   CERVICAL DISC SURGERY  2009   CHOLECYSTECTOMY N/A 09/23/2022   Procedure: LAPAROSCOPIC CHOLECYSTECTOMY;  Surgeon: Erik Ore, MD;  Location: [facility] OR;  Service: General;  Laterality: N/A;   CORONARY ARTERY BYPASS GRAFT     [place], to LAD, RIMA to RCA 2000,Cfx. Taxus stent 2002    Allergies  Allergies  Allergen Reactions   Colchicine Other (See Comments)    Unknown reaction.   Diclofenac Sodium     Chest Pain   Gabapentin     Increased sedation    Niaspan [Niacin Er]     Involuntary facial flushing     History of Present Illness    Erik Perez is a 77 y.o. male with PMH of new onset atrial fibrillation (on Eliquis) CAD s/p CABG (LIMA LAD; RIMA RCA, 2009) and Taxus stent in the native circumflex, hypertension, OSA on CPAP, prostate CA, AAA, hyperlipidemia, and bilateral carotid disease who presents today for 14-month follow-up.  Erik Perez has been followed by Erik Perez since 2015 for management of coronary artery disease and recently establish care with Erik Perez.  Visit patient reported doing well with improvement to lightheadedness.  Blood pressures were  remaining elevated and heart rate was controlled.  He was started on Toprol-XL 25 mg at bedtime was advised to use as needed Lasix sparingly due kidney function.  He was referred to the lipid clinic and was seen by [facility] on 4//24.  Patient was started on amlodipine 5 mg and doxazosin was discontinued.  He underwent AF ablation on 02/11/2023 that was successful.  He was seen in follow-up on 03/11/2023 in AF clinic and denied any episodes of AF and plan to decrease amiodarone to 100 mg daily.  He was seen most recently on 04/13/2023 by Pharm.D. patient remained on amlodipine 10 mg, carvedilol 6.25 mg and lisinopril.  Since last being seen in the office patient reports he has been doing well with no new cardiac complaints since previous visit.  He reports maintaining sinus rhythm episodes of tachycardia or dizziness since his most recent follow-up.  His blood pressure today is controlled at 134/66 and heart rate is 69 bpm.  He reports compliance with his current medications and denies any adverse reactions.  He reports also moderating his alcohol intake and is looking forward to his follow-up visit to see if amiodarone can be discontinued by Erik Perez.  He was euvolemic on examination today but does have chronic bilateral +2 pitting edema which is relieved with as needed Lasix.  Patient denies chest pain, palpitations, dyspnea, PND, orthopnea, nausea, vomiting, dizziness, syncope, edema, weight gain, or early satiety.   Home Medications  Current Outpatient Medications  Medication Sig Dispense Refill   acetaminophen (TYLENOL) 500 MG tablet Take 500-1,000 mg by mouth every 6 (six) hours as needed (pain.).     allopurinol (ZYLOPRIM) 100 MG tablet Take 100 mg by mouth 2 (two) times daily.     amiodarone (PACERONE) 200 MG tablet TAKE 1 TABLET (200 MG) BY MOUTH IN THE MORNING 90 tablet 2   amLODipine (NORVASC) 10 MG tablet Take 1 tablet (10 mg total) by mouth daily. 90 tablet 3   apixaban (ELIQUIS) 5 MG TABS  tablet TAKE 1 TABLET BY MOUTH TWICE A DAY 60 tablet 7   atorvastatin (LIPITOR) 40 MG tablet Take 40 mg by mouth every evening. (1900)     calcium gluconate 500 MG tablet Take 2 tablets in the morning & take 1 tablet at night.     carvedilol (COREG) 6.25 MG tablet Take 1 tablet (6.25 mg total) by mouth 2 (two) times daily. 180 tablet 3   Cholecalciferol (VITAMIN D) 2000 UNITS tablet Take 2,000 Units by mouth 2 (two) times daily.      cyanocobalamin (VITAMIN B12) 1000 MCG/ML injection Inject 1,000 mcg into the muscle every 30 (thirty) days.     diltiazem (CARDIZEM) 30 MG tablet Take 1 tablet (30 mg total) by mouth 2 (two) times daily as needed (for heart rate greater than 110).     fexofenadine (ALLEGRA) 180 MG tablet Take 180 mg by mouth in the morning.     furosemide (LASIX) 20 MG tablet Take 20 mg oral daily for next 5 days, then as needed for edema or fluid retention 30 tablet 0   lisinopril (ZESTRIL) 40 MG tablet Take 40 mg by mouth in the morning.     Multiple Vitamin (MULTIVITAMIN) tablet Take 1 tablet by mouth in the morning.     nitroGLYCERIN (NITROSTAT) 0.4 MG SL tablet Place 1 tablet (0.4 mg total) under the tongue every 5 (five) minutes as needed for chest pain. 25 tablet 1   NON FORMULARY Place 1 Application in ear(s) 2 (two) times a week. Chloramphenicol 50 mg /amphotericin B 5 mg (Tuesdays & Fridays)     Omega-3 Fatty Acids (FISH OIL) 1200 MG CAPS Take 1,200 mg by mouth 2 (two) times daily.     No current facility-administered medications for this visit.     Review of Systems  Please see the history of present illness.    (+) +1 bilateral pitting edema  All other systems reviewed and are otherwise negative except as noted above.  Physical Exam    Wt Readings from Last 3 Encounters:  03/11/23 207 lb 12.8 oz (94.3 kg)  02/11/23 207 lb (93.9 kg)  02/05/23 213 lb 3.2 oz (96.7 kg)   WJ:XBJYN were no vitals filed for this visit.,There is no height or weight on file to calculate  BMI.  Constitutional:      Appearance: Healthy appearance. Not in distress.  Neck:     Vascular: JVD normal.  Pulmonary:     Effort: Pulmonary effort is normal.     Breath sounds: No wheezing. No rales. Diminished in the bases Cardiovascular:     Normal rate. Regular rhythm. Normal S1. Normal S2.      Murmurs: There is no murmur.  Edema:    Peripheral edema absent.  Abdominal:     Palpations: Abdomen is soft non tender. There is no hepatomegaly.  Skin:    General: Skin is warm and dry.  Neurological:     General:  No focal deficit present.     Mental Status: Alert and oriented to person, place and time.     Cranial Nerves: Cranial nerves are intact.  EKG/LABS/ Recent Cardiac Studies    ECG personally reviewed by me today -none completed today   Risk Assessment/Calculations:    CHA2DS2-VASc Score = 4   This indicates a 4.8% annual risk of stroke. The patient's score is based upon: CHF History: 0 HTN History: 1 Diabetes History: 0 Stroke History: 0 Vascular Disease History: 1 Age Score: 2 Gender Score: 0           Lab Results  Component Value Date   WBC 5.2 02/05/2023   HGB 12.3 (L) 02/05/2023   HCT 37.2 (L) 02/05/2023   MCV 103 (H) 02/05/2023   PLT 152 02/05/2023   Lab Results  Component Value Date   CREATININE 2.53 (H) 02/05/2023   BUN 41 (H) 02/05/2023   NA 138 02/05/2023   K 5.2 02/05/2023   CL 105 02/05/2023   CO2 22 02/05/2023   Lab Results  Component Value Date   ALT 42 02/05/2023   AST 27 02/05/2023   ALKPHOS 56 02/05/2023   BILITOT 0.7 02/05/2023   Lab Results  Component Value Date   CHOL 111 11/25/2022   HDL 44 11/25/2022   LDLCALC 50 11/25/2022   TRIG 84 11/25/2022   CHOLHDL 2.5 11/25/2022    No results found for: "HGBA1C"   Assessment & Plan    1. Coronary artery disease: -s/p CABG 2000 with PCI andT axus stent in the native circumflex in 2009. -Today patient reports no chest pain or anginal equivalents -Continue GDMT with  Lipitor 40 mg, 6.25 mg twice daily, fish oil 1200 mg daily and as needed Nitrostat 0.4 mg  2.Paroxysmal atrial fibrillation:  -s/p AF ablation on 02/11/2023 with plan to hopefully discontinue amiodarone in the future. -Continue amiodarone 200 mg daily carvedilol 6.25 mg twice daily -Continue as needed Cardizem 30 mg -Patient's most recent creatinine was 2.3 and hemoglobin was 10.9 -Continue Eliquis 5 mg twice daily CHA2DS2-VASc Score = 4 [CHF History: 0, HTN History: 1, Diabetes History: 0, Stroke History: 0, Vascular Disease History: 1, Age Score: 2, Gender Score: 0].  Therefore, the patient's annual risk of stroke is 4.8 %.      3.Essential hypertension: -Blood pressure today is well-controlled at 134/66 -He is currently followed by Pharm.D. -Continue carvedilol 6.25 mg twice daily, Norvasc 10 mg daily, lisinopril 40 mg daily  4. Hyperlipidemia: -Patient's last LDL cholesterol was controlled at 50 -Continue Lipitor 40 mg daily  5.  CKD stage IV: -Currently followed by [facility]   Disposition: Follow-up with Erik Noe, MD (Inactive) or APP in 6 months     Medication Adjustments/Labs and Tests Ordered: Current medicines are reviewed at length with the patient today.  Concerns regarding medicines are outlined above.   Signed, Napoleon Form, Leodis Rains, NP 05/22/2023, 11:53 AM [facility]

## 2023-05-25 ENCOUNTER — Ambulatory Visit: Payer: Medicare PPO | Attending: Internal Medicine | Admitting: Nurse Practitioner

## 2023-05-25 ENCOUNTER — Encounter: Payer: Self-pay | Admitting: Nurse Practitioner

## 2023-05-25 VITALS — BP 134/66 | HR 69 | Ht 71.0 in | Wt 218.0 lb

## 2023-05-25 DIAGNOSIS — E782 Mixed hyperlipidemia: Secondary | ICD-10-CM

## 2023-05-25 DIAGNOSIS — I10 Essential (primary) hypertension: Secondary | ICD-10-CM

## 2023-05-25 DIAGNOSIS — I25708 Atherosclerosis of coronary artery bypass graft(s), unspecified, with other forms of angina pectoris: Secondary | ICD-10-CM

## 2023-05-25 DIAGNOSIS — I480 Paroxysmal atrial fibrillation: Secondary | ICD-10-CM | POA: Diagnosis not present

## 2023-05-25 DIAGNOSIS — N184 Chronic kidney disease, stage 4 (severe): Secondary | ICD-10-CM

## 2023-05-25 NOTE — Patient Instructions (Signed)
Medication Instructions:  Your physician recommends that you continue on your current medications as directed. Please refer to the Current Medication list given to you today. *If you need a refill on your cardiac medications before your next appointment, please call your pharmacy*   Lab Work: None ordered   Testing/Procedures: None ordered   Follow-Up: At [facility], you and your health needs are our priority.  As part of our continuing mission to provide you with exceptional heart care, we have created designated Provider Care Teams.  These Care Teams include your primary Cardiologist (physician) and Advanced Practice Providers (APPs -  Physician Assistants and Nurse Practitioners) who all work together to provide you with the care you need, when you need it.  We recommend signing up for the patient portal called "MyChart".  Sign up information is provided on this After Visit Summary.  MyChart is used to connect with patients for Virtual Visits (Telemedicine).  Patients are able to view lab/test results, encounter notes, upcoming appointments, etc.  Non-urgent messages can be sent to your provider as well.   To learn more about what you can do with MyChart, go to [email or web address].    Your next appointment:   6 month(s)  Provider:   Robin Searing, NP  Other Instructions

## 2023-05-29 ENCOUNTER — Ambulatory Visit: Payer: Medicare PPO | Attending: Cardiology | Admitting: Cardiology

## 2023-05-29 ENCOUNTER — Encounter: Payer: Self-pay | Admitting: Cardiology

## 2023-05-29 VITALS — BP 150/64 | HR 79 | Ht 71.0 in | Wt 214.8 lb

## 2023-05-29 DIAGNOSIS — I10 Essential (primary) hypertension: Secondary | ICD-10-CM | POA: Diagnosis not present

## 2023-05-29 DIAGNOSIS — I480 Paroxysmal atrial fibrillation: Secondary | ICD-10-CM

## 2023-05-29 DIAGNOSIS — I2510 Atherosclerotic heart disease of native coronary artery without angina pectoris: Secondary | ICD-10-CM

## 2023-05-29 DIAGNOSIS — D6869 Other thrombophilia: Secondary | ICD-10-CM

## 2023-05-29 NOTE — Progress Notes (Signed)
Electrophysiology Office Note:   Date:  05/29/2023  ID:  Erik Perez, DOB 1945/11/23, MRN [id number]  Primary Cardiologist: Lesleigh Noe, MD (Inactive) Electrophysiologist: Darden Flemister Jorja Loa, MD      History of Present Illness:   Erik Perez is a 77 y.o. male with h/o atrial fibrillation seen today for routine electrophysiology followup.  Since last being seen in our clinic the patient reports doing well.  Since his ablation, he has noted no further episodes of atrial fibrillation.  He has been able to do his daily activities without restriction.  He has questions about alcohol, though he states that he does not drink very often.  he denies chest pain, palpitations, dyspnea, PND, orthopnea, nausea, vomiting, dizziness, syncope, edema, weight gain, or early satiety.      Has a history significant for coronary artery disease post CABG, hypertension, OSA, prostate cancer, hyperlipidemia, carotid artery disease, atrial fibrillation.  He was diagnosed with atrial fibrillation 07/10/2022 after presenting to emergency room with palpitations and chest pain.  He had a second ER visit 08/20/2022 with atrial fibrillation.  He has been started on amiodarone.  Since starting amiodarone he has done well with minimal episodes of atrial fibrillation.  Unfortunately he did develop COVID and had a cholecystectomy this month.  He is feeling tired and fatigued, but he feels that it was due to the above issues.  He is post atrial fibrillation ablation 02/11/2023.      Review of systems complete and found to be negative unless listed in HPI.   EP Information / Studies Reviewed:    EKG is ordered today. Personal review as below.  EKG Interpretation Date/Time:  Friday May 29 2023 15:17:27 EDT Ventricular Rate:  79 PR Interval:  146 QRS Duration:  106 QT Interval:  420 QTC Calculation: 481 R Axis:   92  Text Interpretation: Normal sinus rhythm Rightward axis Anteroseptal infarct  (cited on or before 11-Feb-2023) When compared with ECG of 11-Feb-2023 13:23, No significant change was found Confirmed by Craig Wisnewski ([id number]) on 05/29/2023 3:26:04 PM     Risk Assessment/Calculations:    CHA2DS2-VASc Score = 4   This indicates a 4.8% annual risk of stroke. The patient's score is based upon: CHF History: 0 HTN History: 1 Diabetes History: 0 Stroke History: 0 Vascular Disease History: 1 Age Score: 2 Gender Score: 0     Physical Exam:   VS:  BP (!) 150/64   Pulse 79   Ht 5\' 11"  (1.803 m)   Wt 214 lb 12.8 oz (97.4 kg)   SpO2 99%   BMI 29.96 kg/m    Wt Readings from Last 3 Encounters:  05/29/23 214 lb 12.8 oz (97.4 kg)  05/25/23 218 lb (98.9 kg)  03/11/23 207 lb 12.8 oz (94.3 kg)     GEN: Well nourished, well developed in no acute distress NECK: No JVD; No carotid bruits CARDIAC: Regular rate and rhythm, no murmurs, rubs, gallops RESPIRATORY:  Clear to auscultation without rales, wheezing or rhonchi  ABDOMEN: Soft, non-tender, non-distended EXTREMITIES:  No edema; No deformity   ASSESSMENT AND PLAN:    1.  Paroxysmal atrial fibrillation: Currently on amiodarone.  Status post ablation 02/11/2023.  He is in sinus rhythm.  He has been doing well.  Coleta Grosshans stop amiodarone today.  2.  Secondary hypercoagulable state: Currently on Eliquis for atrial fibrillation  3.  Obesity: Lifestyle modification encouraged  4.  Obstructive sleep apnea: CPAP compliance encouraged  5.  Coronary artery disease:  No current chest pain  6.  Hypertension: Elevated today.  Usually well-controlled.  No changes.  Follow up with Afib Clinic in 6 months  Signed, Roy Tokarz Jorja Loa, MD

## 2023-05-29 NOTE — Patient Instructions (Signed)
Medication Instructions:  Your physician has recommended you make the following change in your medication: STOP Amiodarone  *If you need a refill on your cardiac medications before your next appointment, please call your pharmacy*   Lab Work: None ordered   Testing/Procedures: None ordered   Follow-Up: At [facility], you and your health needs are our priority.  As part of our continuing mission to provide you with exceptional heart care, we have created designated Provider Care Teams.  These Care Teams include your primary Cardiologist (physician) and Advanced Practice Providers (APPs -  Physician Assistants and Nurse Practitioners) who all work together to provide you with the care you need, when you need it.  Your next appointment:   6 month(s)  The format for your next appointment:   In Person  Provider:   You will follow up in the Atrial Fibrillation Clinic located at [facility]. Your provider will be: Clint R. Fenton, PA-C  Or Landry Mellow, PA  Thank you for choosing [facility]!!   Dory Horn, RN [phone]

## 2023-06-10 DIAGNOSIS — Z23 Encounter for immunization: Secondary | ICD-10-CM | POA: Diagnosis not present

## 2023-06-10 DIAGNOSIS — E538 Deficiency of other specified B group vitamins: Secondary | ICD-10-CM | POA: Diagnosis not present

## 2023-06-24 DIAGNOSIS — N2581 Secondary hyperparathyroidism of renal origin: Secondary | ICD-10-CM | POA: Diagnosis not present

## 2023-06-24 DIAGNOSIS — D631 Anemia in chronic kidney disease: Secondary | ICD-10-CM | POA: Diagnosis not present

## 2023-06-24 DIAGNOSIS — N184 Chronic kidney disease, stage 4 (severe): Secondary | ICD-10-CM | POA: Diagnosis not present

## 2023-06-24 DIAGNOSIS — I129 Hypertensive chronic kidney disease with stage 1 through stage 4 chronic kidney disease, or unspecified chronic kidney disease: Secondary | ICD-10-CM | POA: Diagnosis not present

## 2023-07-08 DIAGNOSIS — E538 Deficiency of other specified B group vitamins: Secondary | ICD-10-CM | POA: Diagnosis not present

## 2023-07-22 DIAGNOSIS — Z23 Encounter for immunization: Secondary | ICD-10-CM | POA: Diagnosis not present

## 2023-08-05 DIAGNOSIS — E538 Deficiency of other specified B group vitamins: Secondary | ICD-10-CM | POA: Diagnosis not present

## 2023-08-13 DIAGNOSIS — C61 Malignant neoplasm of prostate: Secondary | ICD-10-CM | POA: Diagnosis not present

## 2023-08-18 DIAGNOSIS — Z8669 Personal history of other diseases of the nervous system and sense organs: Secondary | ICD-10-CM | POA: Diagnosis not present

## 2023-08-18 DIAGNOSIS — H6123 Impacted cerumen, bilateral: Secondary | ICD-10-CM | POA: Diagnosis not present

## 2023-08-18 DIAGNOSIS — H903 Sensorineural hearing loss, bilateral: Secondary | ICD-10-CM | POA: Diagnosis not present

## 2023-08-20 ENCOUNTER — Telehealth: Payer: Self-pay | Admitting: *Deleted

## 2023-08-20 ENCOUNTER — Encounter: Payer: Self-pay | Admitting: Internal Medicine

## 2023-08-20 DIAGNOSIS — C61 Malignant neoplasm of prostate: Secondary | ICD-10-CM | POA: Diagnosis not present

## 2023-08-20 DIAGNOSIS — N5201 Erectile dysfunction due to arterial insufficiency: Secondary | ICD-10-CM | POA: Diagnosis not present

## 2023-08-20 DIAGNOSIS — N281 Cyst of kidney, acquired: Secondary | ICD-10-CM | POA: Diagnosis not present

## 2023-08-20 DIAGNOSIS — G4733 Obstructive sleep apnea (adult) (pediatric): Secondary | ICD-10-CM | POA: Diagnosis not present

## 2023-08-20 NOTE — Telephone Encounter (Signed)
Error

## 2023-08-20 NOTE — Telephone Encounter (Signed)
   Pre-operative Risk Assessment    Patient Name: Erik Perez  DOB: [birth year] MRN: [id number]  DATE OF LAST VISIT: 05/29/23 DR. CAMNITZ DATE OF NEXT VISIT: NONE    Request for Surgical Clearance    Procedure:   TRANSRECTAL ULTRASOUND PROSTATE Bx (TRUS)  Date of Surgery:  Clearance 02/15/24                                 Surgeon:  DR. [facility] Surgeon's Group or Practice Name:  [facility] Phone number:  [phone] Fax number:  [phone]   Type of Clearance Requested:   - Medical  - Pharmacy:  Hold Apixaban (Eliquis)     Type of Anesthesia:  Not Indicated   Additional requests/questions:    Elpidio Anis   08/20/2023, 4:09 PM

## 2023-09-02 DIAGNOSIS — I4891 Unspecified atrial fibrillation: Secondary | ICD-10-CM | POA: Diagnosis not present

## 2023-09-02 DIAGNOSIS — E7800 Pure hypercholesterolemia, unspecified: Secondary | ICD-10-CM | POA: Diagnosis not present

## 2023-09-02 DIAGNOSIS — E538 Deficiency of other specified B group vitamins: Secondary | ICD-10-CM | POA: Diagnosis not present

## 2023-09-02 DIAGNOSIS — I2510 Atherosclerotic heart disease of native coronary artery without angina pectoris: Secondary | ICD-10-CM | POA: Diagnosis not present

## 2023-09-02 DIAGNOSIS — Z0000 Encounter for general adult medical examination without abnormal findings: Secondary | ICD-10-CM | POA: Diagnosis not present

## 2023-09-02 DIAGNOSIS — I10 Essential (primary) hypertension: Secondary | ICD-10-CM | POA: Diagnosis not present

## 2023-09-02 DIAGNOSIS — N184 Chronic kidney disease, stage 4 (severe): Secondary | ICD-10-CM | POA: Diagnosis not present

## 2023-09-07 NOTE — Telephone Encounter (Signed)
Pt has appt 11/24/23 with Robin Searing, NP, which should fall into the pre op guidelines. I will confirm this with the pre op APP if ok to defer clearance to NP at appt 11/24/23.

## 2023-09-07 NOTE — Telephone Encounter (Signed)
Per preop APP today Carlos Levering, NP ok to defer clearance to Robin Searing, NP 11/24/23 appt.

## 2023-09-19 DIAGNOSIS — G4733 Obstructive sleep apnea (adult) (pediatric): Secondary | ICD-10-CM | POA: Diagnosis not present

## 2023-10-07 DIAGNOSIS — E538 Deficiency of other specified B group vitamins: Secondary | ICD-10-CM | POA: Diagnosis not present

## 2023-10-19 DIAGNOSIS — N184 Chronic kidney disease, stage 4 (severe): Secondary | ICD-10-CM | POA: Diagnosis not present

## 2023-10-20 DIAGNOSIS — G4733 Obstructive sleep apnea (adult) (pediatric): Secondary | ICD-10-CM | POA: Diagnosis not present

## 2023-10-26 DIAGNOSIS — I129 Hypertensive chronic kidney disease with stage 1 through stage 4 chronic kidney disease, or unspecified chronic kidney disease: Secondary | ICD-10-CM | POA: Diagnosis not present

## 2023-10-26 DIAGNOSIS — N189 Chronic kidney disease, unspecified: Secondary | ICD-10-CM | POA: Diagnosis not present

## 2023-10-26 DIAGNOSIS — N184 Chronic kidney disease, stage 4 (severe): Secondary | ICD-10-CM | POA: Diagnosis not present

## 2023-10-26 DIAGNOSIS — D631 Anemia in chronic kidney disease: Secondary | ICD-10-CM | POA: Diagnosis not present

## 2023-10-26 DIAGNOSIS — N2581 Secondary hyperparathyroidism of renal origin: Secondary | ICD-10-CM | POA: Diagnosis not present

## 2023-11-01 ENCOUNTER — Other Ambulatory Visit (HOSPITAL_COMMUNITY): Payer: Self-pay | Admitting: Physician Assistant

## 2023-11-03 NOTE — Telephone Encounter (Signed)
Prescription refill request for Eliquis received. Indication:afib Last office visit:8/24 Scr:2.53  5/24 Age: 78 Weight:97.4  kg  Prescription refilled

## 2023-11-04 DIAGNOSIS — E538 Deficiency of other specified B group vitamins: Secondary | ICD-10-CM | POA: Diagnosis not present

## 2023-11-04 NOTE — Progress Notes (Signed)
 Cardiology Office Note:   Date:  11/05/2023  ID:  Erik Perez, DOB 1945-12-13, MRN [id number] PCP:  Erik Motto, DO  [facility] Providers Cardiologist:  Erik Haws, MD Referring MD: Erik Motto, DO  Chief Complaint/Reason for Referral: Cardiology follow-up ASSESSMENT:    1. S/P CABG x 2   2. Essential hypertension, benign   3. Hyperlipidemia LDL goal <70   4. Elevated lipoprotein(a)   5. Aortic atherosclerosis (HCC)   6. Persistent atrial fibrillation (HCC)   7. Secondary hypercoagulable state (HCC)   8. CKD (chronic kidney disease) stage 4, GFR 15-29 ml/min (HCC)   9. BMI 30.0-30.9,adult     PLAN:   In order of problems listed above: Coronary artery disease: Continue Eliquis  and atorvastatin . Hypertension: Blood pressure is well-controlled today. Hyperlipidemia: Check lipid panel and LFTs today. Elevated LP(a): Will target LDL less than 55 given elevated LP(a). Aortic atherosclerosis: Continue Eliquis  and atorvastatin . Persistent atrial fibrillation: Followed by EP.  Off amiodarone .  Successful PVI in May 2024.  EKG in August demonstrated normal sinus rhythm.  In normal sinus rhythm today. Secondary hypercoagulable state: Continue Eliquis . CKD stage IV: Continue lisinopril for renal protection.  Start Jardiance  for renal protection. Elevated BMI: Diet and exercise modification.  Check hemoglobin A1c to screen for diabetes.  If consistent with diabetes will alert PCP and refer to pharmacy for recommendations regarding GLP-1 receptor agonist therapy.            Dispo:  Return in about 6 months (around 05/04/2024).      Medication Adjustments/Labs and Tests Ordered: Current medicines are reviewed at length with the patient today.  Concerns regarding medicines are outlined above.  The following changes have been made:     Labs/tests ordered: Orders Placed This Encounter  Procedures   Lipid panel   Hepatic function panel   Hemoglobin A1c     Medication Changes: Meds ordered this encounter  Medications   empagliflozin  (JARDIANCE ) 10 MG TABS tablet    Sig: Take 1 tablet (10 mg total) by mouth daily before breakfast.    Dispense:  30 tablet    Refill:  11   empagliflozin  (JARDIANCE ) 10 MG TABS tablet    Sig: Take 1 tablet (10 mg total) by mouth daily before breakfast.    Dispense:  28 tablet    Refill:  0    Lot Number?:   [id number]    Expiration Date?:   09/28/2025    Current medicines are reviewed at length with the patient today.  The patient does not have concerns regarding medicines.  I spent 35 minutes reviewing all clinical data during and prior to this visit including all relevant imaging studies, laboratories, clinical information from other health systems and prior notes from both Cardiology and other specialties, interviewing the patient, conducting a complete physical examination, and coordinating care in order to formulate a comprehensive and personalized evaluation and treatment plan.   History of Present Illness:      FOCUSED PROBLEM LIST:   CAD CABG LIMA to LAD, RIMA to RCA 2000 PCI left circumflex 2001 Hypertension Hyperlipidemia LP(a) 122.9 Aortic atherosclerosis Chest CT 2023 Persistent AF CV 2 score of 4 On Eliquis  PVI May 2024 CKD stage IV BMI 30  2/24:  The patient is a 78 y.o. male with the indicated medical history here for medical follow-up.  The patient was started on Eliquis  for new onset atrial fibrillation in October.  He spontaneously converted a few weeks after this.  He developed  acute cholecystitis at the end of December and underwent a laparoscopic cholecystectomy without issues.  He was seen in the [facility] cardiology division for outpatient follow-up in January.  At that visit he was doing well.  His medical regimen was continued aside from decreasing metoprolol  to 50 mg a day due to a bout of dizziness.  He continued to have dizziness so his metoprolol  was decreased to 12.5 mg a  day.   The patient is here with his family.  He denies any exertional angina or dyspnea.  He tells me his lightheadedness is much improved.  His blood pressure log that I reviewed today demonstrates blood pressures that are primarily in the 120s to 160s.  His heart rate has been anywhere from the 70s to 100s.  He denies any severe bleeding or bruising while on Eliquis .  He has had no signs or symptoms of stroke.  He denies any peripheral edema, or paroxysmal nocturnal dyspnea.  He has not required any emergency room visits or hospitalizations since December.  Plan: Change Lopressor  to Toprol  25 mg every afternoon, check lipid panel, LFTs, LP(a);  Follow-up with the EP and Pharm.D. for blood pressure..  2/25: In the interim the patient saw pharmacy and his doxazosin  was stopped and amlodipine  started.  His LP(a) was elevated however his LDL was 50.  He was seen by EP and underwent PVI and May 2024.  He was seen in general cardiology clinic in August and was doing well.  His blood pressure was well-controlled.  No changes were made to his medical regimen.  He was seen by Dr. Inocencio in August and his amiodarone  was discontinued.  The patient is doing well.  He brought a blood pressure log in with him and his blood pressures are very well-controlled.  Unfortunately because of his health issues and regarding emergency gallbladder surgery he is fairly deconditioned and requires a walker at times to ambulate.  He feels like he is getting stronger.  He feels much better in regards to his atrial fibrillation.  He denies any symptomatic palpitations, presyncope, or syncope.  He has had no bleeding issues on Eliquis .  He is otherwise well and without significant complaints today.          Current Medications: Current Meds  Medication Sig   acetaminophen  (TYLENOL ) 500 MG tablet Take 500-1,000 mg by mouth every 6 (six) hours as needed (pain.).   allopurinol  (ZYLOPRIM ) 100 MG tablet Take 100 mg by mouth 2 (two)  times daily.   amLODipine  (NORVASC ) 10 MG tablet Take 1 tablet (10 mg total) by mouth daily.   atorvastatin  (LIPITOR) 40 MG tablet Take 40 mg by mouth every evening. (1900)   calcium  gluconate 500 MG tablet Take 2 tablets in the morning & take 1 tablet at night.   carvedilol  (COREG ) 6.25 MG tablet Take 1 tablet (6.25 mg total) by mouth 2 (two) times daily.   Cholecalciferol (VITAMIN D ) 2000 UNITS tablet Take 2,000 Units by mouth daily.   cyanocobalamin  (VITAMIN B12) 1000 MCG/ML injection Inject 1,000 mcg into the muscle every 30 (thirty) days.   diltiazem  (CARDIZEM ) 30 MG tablet Take 1 tablet (30 mg total) by mouth 2 (two) times daily as needed (for heart rate greater than 110).   ELIQUIS  5 MG TABS tablet TAKE 1 TABLET BY MOUTH TWICE A DAY   empagliflozin  (JARDIANCE ) 10 MG TABS tablet Take 1 tablet (10 mg total) by mouth daily before breakfast.   empagliflozin  (JARDIANCE ) 10 MG TABS tablet Take  1 tablet (10 mg total) by mouth daily before breakfast.   fexofenadine (ALLEGRA) 180 MG tablet Take 180 mg by mouth in the morning.   furosemide  (LASIX ) 20 MG tablet Take 20 mg oral daily for next 5 days, then as needed for edema or fluid retention   lisinopril (ZESTRIL) 40 MG tablet Take 40 mg by mouth in the morning.   Multiple Vitamin (MULTIVITAMIN) tablet Take 1 tablet by mouth in the morning.   nitroGLYCERIN  (NITROSTAT ) 0.4 MG SL tablet Place 1 tablet (0.4 mg total) under the tongue every 5 (five) minutes as needed for chest pain.   NON FORMULARY Place 1 Application in ear(s) 2 (two) times a week. Chloramphenicol 50 mg /amphotericin B 5 mg (Tuesdays & Fridays)   Omega-3 Fatty Acids (FISH OIL) 1200 MG CAPS Take 1,200 mg by mouth 2 (two) times daily.   sildenafil (VIAGRA) 100 MG tablet 1 tablet as needed Orally Once a day for 30 day(s)     Review of Systems:   Please see the history of present illness.    All other systems reviewed and are negative.     EKGs/Labs/Other Test Reviewed:   EKG: EKG  from August 2024 demonstrates sinus rhythm and anteroseptal infarction pattern  EKG Interpretation Date/Time:    Ventricular Rate:    PR Interval:    QRS Duration:    QT Interval:    QTC Calculation:   R Axis:      Text Interpretation:           Risk Assessment/Calculations:          Physical Exam:   VS:  BP 130/70   Pulse 88   Ht 5' 11 (1.803 m)   Wt 219 lb 6.4 oz (99.5 kg)   SpO2 96%   BMI 30.60 kg/m        Wt Readings from Last 3 Encounters:  11/05/23 219 lb 6.4 oz (99.5 kg)  05/29/23 214 lb 12.8 oz (97.4 kg)  05/25/23 218 lb (98.9 kg)      GENERAL:  No apparent distress, AOx3 HEENT:  No carotid bruits, +2 carotid impulses, no scleral icterus CAR: RRR no murmurs, gallops, rubs, or thrills RES:  Clear to auscultation bilaterally ABD:  Soft, nontender, nondistended, positive bowel sounds x 4 VASC:  +2 radial pulses, +2 carotid pulses NEURO:  CN 2-12 grossly intact; motor and sensory grossly intact PSYCH:  No active depression or anxiety EXT:  No edema, ecchymosis, or cyanosis  Signed, Ethaniel Garfield K Trusten Hume, MD  11/05/2023 4:02 PM    [facility] [address] [place], [place], [place]  [address] Phone: [phone]; Fax: [phone]   Note:  This document was prepared using Dragon voice recognition software and may include unintentional dictation errors.

## 2023-11-05 ENCOUNTER — Encounter: Payer: Self-pay | Admitting: Internal Medicine

## 2023-11-05 ENCOUNTER — Ambulatory Visit: Payer: Medicare PPO | Attending: Internal Medicine | Admitting: Internal Medicine

## 2023-11-05 VITALS — BP 130/70 | HR 88 | Ht 71.0 in | Wt 219.4 lb

## 2023-11-05 DIAGNOSIS — I10 Essential (primary) hypertension: Secondary | ICD-10-CM | POA: Diagnosis not present

## 2023-11-05 DIAGNOSIS — Z6830 Body mass index (BMI) 30.0-30.9, adult: Secondary | ICD-10-CM | POA: Diagnosis not present

## 2023-11-05 DIAGNOSIS — N184 Chronic kidney disease, stage 4 (severe): Secondary | ICD-10-CM | POA: Diagnosis not present

## 2023-11-05 DIAGNOSIS — Z951 Presence of aortocoronary bypass graft: Secondary | ICD-10-CM | POA: Diagnosis not present

## 2023-11-05 DIAGNOSIS — I4819 Other persistent atrial fibrillation: Secondary | ICD-10-CM | POA: Diagnosis not present

## 2023-11-05 DIAGNOSIS — E785 Hyperlipidemia, unspecified: Secondary | ICD-10-CM | POA: Diagnosis not present

## 2023-11-05 DIAGNOSIS — D6869 Other thrombophilia: Secondary | ICD-10-CM | POA: Diagnosis not present

## 2023-11-05 DIAGNOSIS — E7841 Elevated Lipoprotein(a): Secondary | ICD-10-CM

## 2023-11-05 DIAGNOSIS — I700 Atherosclerosis of aorta: Secondary | ICD-10-CM | POA: Diagnosis not present

## 2023-11-05 MED ORDER — EMPAGLIFLOZIN 10 MG PO TABS: 10.0000 mg | ORAL_TABLET | Freq: Every day | ORAL | 11 refills | Status: AC

## 2023-11-05 MED ORDER — EMPAGLIFLOZIN 10 MG PO TABS: 10.0000 mg | ORAL_TABLET | Freq: Every day | ORAL | 0 refills | Status: AC

## 2023-11-05 NOTE — Patient Instructions (Addendum)
 Medication Instructions:  Your physician has recommended you make the following change in your medication:   1) START empagliflozin  (Jardiance ) 10 mg daily  *If you need a refill on your cardiac medications before your next appointment, please call your pharmacy*  Lab Work: TODAY (go to 1st floor, [address]): Lipid panel, LFTs, Hgb A1c If you have labs (blood work) drawn today and your tests are completely normal, you will receive your results only by: MyChart Message (if you have MyChart) OR A paper copy in the mail If you have any lab test that is abnormal or we need to change your treatment, we will call you to review the results.  Testing/Procedures: None ordered today.  Follow-Up: At [facility], you and your health needs are our priority.  As part of our continuing mission to provide you with exceptional heart care, we have created designated Provider Care Teams.  These Care Teams include your primary Cardiologist (physician) and Advanced Practice Providers (APPs -  Physician Assistants and Nurse Practitioners) who all work together to provide you with the care you need, when you need it.  Your next appointment:   6 month(s)  The format for your next appointment:   In Person  Provider:   Glendia Ferrier, PA-C      Other Instructions   1st Floor: - Lobby - Registration  - Pharmacy  - Lab - Cafe  2nd Floor: - PV Lab - Diagnostic Testing (echo, CT, nuclear med)  3rd Floor: - Vacant  4th Floor: - TCTS (cardiothoracic surgery) - AFib Clinic - Structural Heart Clinic - Vascular Surgery  - Vascular Ultrasound  5th Floor: - HeartCare Cardiology (general and EP) - Clinical Pharmacy for coumadin, hypertension, lipid, weight-loss medications, and med management appointments    Valet parking services will be available as well.

## 2023-11-06 ENCOUNTER — Encounter: Payer: Self-pay | Admitting: Internal Medicine

## 2023-11-06 ENCOUNTER — Other Ambulatory Visit: Payer: Self-pay

## 2023-11-06 DIAGNOSIS — E785 Hyperlipidemia, unspecified: Secondary | ICD-10-CM

## 2023-11-06 LAB — HEPATIC FUNCTION PANEL
ALT: 33 [IU]/L (ref 0–44)
AST: 30 [IU]/L (ref 0–40)
Albumin: 4.2 g/dL (ref 3.8–4.8)
Alkaline Phosphatase: 65 [IU]/L (ref 44–121)
Bilirubin Total: 0.9 mg/dL (ref 0.0–1.2)
Bilirubin, Direct: 0.26 mg/dL (ref 0.00–0.40)
Total Protein: 6.2 g/dL (ref 6.0–8.5)

## 2023-11-06 LAB — HEMOGLOBIN A1C
Est. average glucose Bld gHb Est-mCnc: 114 mg/dL
Hgb A1c MFr Bld: 5.6 % (ref 4.8–5.6)

## 2023-11-06 LAB — LIPID PANEL
Chol/HDL Ratio: 3.6 {ratio} (ref 0.0–5.0)
Cholesterol, Total: 137 mg/dL (ref 100–199)
HDL: 38 mg/dL — ABNORMAL LOW (ref >39)
LDL Chol Calc (NIH): 76 mg/dL (ref 0–99)
Triglycerides: 126 mg/dL (ref 0–149)
VLDL Cholesterol Cal: 23 mg/dL (ref 5–40)

## 2023-11-06 MED ORDER — ATORVASTATIN CALCIUM 40 MG PO TABS: 80.0000 mg | ORAL_TABLET | Freq: Every day | ORAL | Status: DC

## 2023-11-20 DIAGNOSIS — G4733 Obstructive sleep apnea (adult) (pediatric): Secondary | ICD-10-CM | POA: Diagnosis not present

## 2023-11-23 DIAGNOSIS — H903 Sensorineural hearing loss, bilateral: Secondary | ICD-10-CM | POA: Diagnosis not present

## 2023-11-23 DIAGNOSIS — Z8669 Personal history of other diseases of the nervous system and sense organs: Secondary | ICD-10-CM | POA: Diagnosis not present

## 2023-11-23 DIAGNOSIS — H6123 Impacted cerumen, bilateral: Secondary | ICD-10-CM | POA: Diagnosis not present

## 2023-11-24 ENCOUNTER — Ambulatory Visit: Payer: Medicare PPO | Admitting: Nurse Practitioner

## 2023-12-01 DIAGNOSIS — G4733 Obstructive sleep apnea (adult) (pediatric): Secondary | ICD-10-CM | POA: Diagnosis not present

## 2023-12-02 DIAGNOSIS — E538 Deficiency of other specified B group vitamins: Secondary | ICD-10-CM | POA: Diagnosis not present

## 2023-12-18 DIAGNOSIS — G4733 Obstructive sleep apnea (adult) (pediatric): Secondary | ICD-10-CM | POA: Diagnosis not present

## 2023-12-30 DIAGNOSIS — E538 Deficiency of other specified B group vitamins: Secondary | ICD-10-CM | POA: Diagnosis not present

## 2024-01-02 ENCOUNTER — Encounter: Payer: Self-pay | Admitting: Internal Medicine

## 2024-01-04 ENCOUNTER — Other Ambulatory Visit: Payer: Self-pay

## 2024-01-04 MED ORDER — ATORVASTATIN CALCIUM 80 MG PO TABS: 80.0000 mg | ORAL_TABLET | Freq: Every day | ORAL | 3 refills | Status: AC

## 2024-01-07 DIAGNOSIS — E785 Hyperlipidemia, unspecified: Secondary | ICD-10-CM | POA: Diagnosis not present

## 2024-01-08 ENCOUNTER — Encounter: Payer: Self-pay | Admitting: Internal Medicine

## 2024-01-08 LAB — LIPID PANEL
Chol/HDL Ratio: 3.6 ratio (ref 0.0–5.0)
Cholesterol, Total: 109 mg/dL (ref 100–199)
HDL: 30 mg/dL — ABNORMAL LOW (ref >39)
LDL Chol Calc (NIH): 56 mg/dL (ref 0–99)
Triglycerides: 125 mg/dL (ref 0–149)
VLDL Cholesterol Cal: 23 mg/dL (ref 5–40)

## 2024-01-08 LAB — HEPATIC FUNCTION PANEL
ALT: 32 IU/L (ref 0–44)
AST: 26 IU/L (ref 0–40)
Albumin: 4.2 g/dL (ref 3.8–4.8)
Alkaline Phosphatase: 60 IU/L (ref 44–121)
Bilirubin Total: 1.1 mg/dL (ref 0.0–1.2)
Bilirubin, Direct: 0.25 mg/dL (ref 0.00–0.40)
Total Protein: 6.2 g/dL (ref 6.0–8.5)

## 2024-01-18 DIAGNOSIS — G4733 Obstructive sleep apnea (adult) (pediatric): Secondary | ICD-10-CM | POA: Diagnosis not present

## 2024-01-26 ENCOUNTER — Other Ambulatory Visit: Payer: Self-pay | Admitting: Internal Medicine

## 2024-01-27 DIAGNOSIS — E538 Deficiency of other specified B group vitamins: Secondary | ICD-10-CM | POA: Diagnosis not present

## 2024-01-28 ENCOUNTER — Encounter: Payer: Self-pay | Admitting: Internal Medicine

## 2024-01-28 MED ORDER — AMLODIPINE BESYLATE 10 MG PO TABS: 10.0000 mg | ORAL_TABLET | Freq: Every day | ORAL | 3 refills | Status: AC

## 2024-02-03 ENCOUNTER — Telehealth: Payer: Self-pay | Admitting: Internal Medicine

## 2024-02-03 NOTE — Telephone Encounter (Signed)
 I am going to forward to preop APP for review, the original request came in 08/19/24 for the 02/15/24 date of surgery. Pt saw Dr. Lorie Rook for preop clearance 11/05/23.    Surgeon is asking if the pt has been cleared.

## 2024-02-03 NOTE — Telephone Encounter (Signed)
 Follow Up:      Arrelle is calling to check on the status of pt's clearance  Please fax to [phone].

## 2024-02-04 ENCOUNTER — Telehealth: Payer: Self-pay

## 2024-02-04 ENCOUNTER — Encounter: Payer: Self-pay | Admitting: Cardiology

## 2024-02-04 NOTE — Telephone Encounter (Signed)
 Med rec and consent done.    Patient Consent for Virtual Visit        Erik Perez has provided verbal consent on 02/04/2024 for a virtual visit (video or telephone).   CONSENT FOR VIRTUAL VISIT FOR:  Erik Perez  By participating in this virtual visit I agree to the following:  I hereby voluntarily request, consent and authorize [facility] and its employed or contracted physicians, physician assistants, nurse practitioners or other licensed health care professionals (the Practitioner), to provide me with telemedicine health care services (the "Services") as deemed necessary by the treating Practitioner. I acknowledge and consent to receive the Services by the Practitioner via telemedicine. I understand that the telemedicine visit will involve communicating with the Practitioner through live audiovisual communication technology and the disclosure of certain medical information by electronic transmission. I acknowledge that I have been given the opportunity to request an in-person assessment or other available alternative prior to the telemedicine visit and am voluntarily participating in the telemedicine visit.  I understand that I have the right to withhold or withdraw my consent to the use of telemedicine in the course of my care at any time, without affecting my right to future care or treatment, and that the Practitioner or I may terminate the telemedicine visit at any time. I understand that I have the right to inspect all information obtained and/or recorded in the course of the telemedicine visit and may receive copies of available information for a reasonable fee.  I understand that some of the potential risks of receiving the Services via telemedicine include:  Delay or interruption in medical evaluation due to technological equipment failure or disruption; Information transmitted may not be sufficient (e.g. poor resolution of images) to allow for appropriate  medical decision making by the Practitioner; and/or  In rare instances, security protocols could fail, causing a breach of personal health information.  Furthermore, I acknowledge that it is my responsibility to provide information about my medical history, conditions and care that is complete and accurate to the best of my ability. I acknowledge that Practitioner's advice, recommendations, and/or decision may be based on factors not within their control, such as incomplete or inaccurate data provided by me or distortions of diagnostic images or specimens that may result from electronic transmissions. I understand that the practice of medicine is not an exact science and that Practitioner makes no warranties or guarantees regarding treatment outcomes. I acknowledge that a copy of this consent can be made available to me via my patient portal [facility] MyChart), or I can request a printed copy by calling the office of [facility].    I understand that my insurance will be billed for this visit.   I have read or had this consent read to me. I understand the contents of this consent, which adequately explains the benefits and risks of the Services being provided via telemedicine.  I have been provided ample opportunity to ask questions regarding this consent and the Services and have had my questions answered to my satisfaction. I give my informed consent for the services to be provided through the use of telemedicine in my medical care

## 2024-02-04 NOTE — Telephone Encounter (Signed)
 Patient with diagnosis of A Fib on Eliquis  for anticoagulation.    Procedure: TRANSRECTAL ULTRASOUND PROSTATE Bx (TRUS)  Date of procedure: 02/15/24   CHA2DS2-VASc Score = 4  This indicates a 4.8% annual risk of stroke. The patient's score is based upon: CHF History: 0 HTN History: 1 Diabetes History: 0 Stroke History: 0 Vascular Disease History: 1 Age Score: 2 Gender Score: 0    CrCl 36 ml/min Platelet count 160K   Per office protocol, patient can hold Eliquis  for 3 days prior to procedure.   .  **This guidance is not considered finalized until pre-operative APP has relayed final recommendations.**

## 2024-02-04 NOTE — Telephone Encounter (Signed)
   Name: Erik Perez  DOB: 02-Feb-1946  MRN: [id number]  Primary Cardiologist: Arun K Thukkani, MD   Preoperative team, please contact this patient and set up a phone call appointment for further preoperative risk assessment. Please obtain consent and complete medication review. Thank you for your help.  I confirm that guidance regarding antiplatelet and oral anticoagulation therapy has been completed and, if necessary, noted below.  Per Pharm D, patient may hold Eliquis  for 3 days prior to procedure.    I also confirmed the patient resides in the state of [place] . As per [facility] Medical Board telemedicine laws, the patient must reside in the state in which the provider is licensed.   Morey Ar, NP 02/04/2024, 1:07 PM [facility]

## 2024-02-04 NOTE — Telephone Encounter (Signed)
 Please advise holding Eliquis  prior to prostate biopsy on 5/19.  Thank you!  DW

## 2024-02-04 NOTE — Telephone Encounter (Signed)
 The pre-op evaluation was never performed. He will need to be scheduled for a virtual visit. Please schedule for Monday or Tuesday.   Thank you!  Lonell Rives. Sayla Golonka, DNP, NP-C  02/04/2024, 11:56 AM [facility] [address] Office [phone] Fax [phone]

## 2024-02-04 NOTE — Telephone Encounter (Signed)
 S/W pt and is now scheduled for TELE preop appt 02/09/24. Med rec and consent done.

## 2024-02-04 NOTE — Telephone Encounter (Signed)
 Morey Ar, NP routed conversation to [employer] hour ago (11:56 AM)   Morey Ar, NP1 hour ago (11:56 AM)    The pre-op evaluation was never performed. He will need to be scheduled for a virtual visit. Please schedule for Monday or Tuesday.    Thank you!   Lonell Rives. [place], DNP, NP-C   02/04/2024, 11:56 AM [facility] [address] Office [phone] Fax [phone]       Note   You routed conversation to [facility] Pre[facility]20 hours ago (5:18 PM)   You20 hours ago (5:18 PM)    I am going to forward to preop APP for review, the original request came in 08/19/24 for the 02/15/24 date of surgery. Pt saw Dr. Lorie Rook for preop clearance 11/05/23.      Surgeon is asking if the pt has been cleared.       Note   Gregory Leash M routed conversation to [facility] Preop Callback21 hours ago (4:23 PM)   Gregory Leash M21 hours ago (4:23 PM)   SW Follow Up:           Joanell Mowers is calling to check on the status of pt's clearance  Please fax to [phone].      Note   Arrelle- Dr Osborn Blaze [phone]  Gregory Leash M21 hours ago (4:19 PM)

## 2024-02-09 ENCOUNTER — Ambulatory Visit: Attending: Internal Medicine | Admitting: Student

## 2024-02-09 DIAGNOSIS — Z01810 Encounter for preprocedural cardiovascular examination: Secondary | ICD-10-CM

## 2024-02-09 NOTE — Progress Notes (Signed)
 Virtual Visit via Telephone Note   Because of Erik Perez co-morbid illnesses, he is at least at moderate risk for complications without adequate follow up.  This format is felt to be most appropriate for this patient at this time.  The patient did not have access to video technology/had technical difficulties with video requiring transitioning to audio format only (telephone).  All issues noted in this document were discussed and addressed.  No physical exam could be performed with this format.  Please refer to the patient's chart for his consent to telehealth for [facility].  Evaluation Performed:  Preoperative cardiovascular risk assessment _____________   Date:  02/09/2024   Patient ID:  Erik Perez, DOB 1946-03-06, MRN [id number] Patient Location:  Home Provider location:   Office  Primary Care Provider:  Mordechai April, DO Primary Cardiologist:  Arun K Thukkani, MD  Chief Complaint / Patient Profile   78 y.o. y/o male with a h/o CAD s/p CABG x 2 2000 and PCI to LCx 2001, PAF s/p PVI May 2024 on anticoagulation, carotid artery disease, AAA, hypertension, hyperlipidemia, OSA on CPAP, CKD stage IV, prostate cancer who is pending transrectal ultrasound prostate biopsy by Dr. Secundino Dach on 02/15/2024 and presents today for telephonic preoperative cardiovascular risk assessment.  History of Present Illness    Erik Perez is a 78 y.o. male who presents via audio/video conferencing for a telehealth visit today.  Pt was last seen in cardiology clinic on 11/05/2023 by Dr. Lorie Rook.  At that time Erik Perez was stable from a cardiac standpoint.  The patient is now pending procedure as outlined above. Since his last visit, he is doing well. Patient denies shortness of breath, orthopnea or PND. He reports dyspnea with heavy exertion, none with routine activities. He has chronic lower extremity edema that he manages with compression socks and leg massagers. No  chest pain, pressure, or tightness. He has occasional palpitations particularly first thing in the morning. He spot checks his rhythm with a [employer]. He stays active by walking when the weather permits. He is independent with ADLs and able to perform light to moderate household activities.   Past Medical History    Past Medical History:  Diagnosis Date   Carotid artery obstruction    Bilateral moderate carotid obstruction 2013 doppler   Coronary artery disease    Hyperlipidemia    Hypertension    OSA (obstructive sleep apnea)    Split 02-08-07 AHI total 32/hr; REM 60/hr O2 sat min NREM 88% REM 88%   Past Surgical History:  Procedure Laterality Date   ATRIAL FIBRILLATION ABLATION N/A 02/11/2023   Procedure: ATRIAL FIBRILLATION ABLATION;  Surgeon: Lei Pump, MD;  Location: [facility];  Service: Cardiovascular;  Laterality: N/A;   CERVICAL DISC SURGERY  2009   CHOLECYSTECTOMY N/A 09/23/2022   Procedure: LAPAROSCOPIC CHOLECYSTECTOMY;  Surgeon: Junie Olds, MD;  Location: [facility] OR;  Service: General;  Laterality: N/A;   CORONARY ARTERY BYPASS GRAFT     [place], to LAD, RIMA to RCA 2000,Cfx. Taxus stent 2002    Allergies  Allergies  Allergen Reactions   Colchicine Other (See Comments)    Unknown reaction.   Diclofenac Sodium     Chest Pain   Gabapentin     Increased sedation    Niaspan [Niacin Er (Antihyperlipidemic)]     Involuntary facial flushing    Home Medications    Prior to Admission medications   Medication Sig Start Date  End Date Taking? Authorizing Provider  acetaminophen  (TYLENOL ) 500 MG tablet Take 500-1,000 mg by mouth every 6 (six) hours as needed (pain.).    [provider]  allopurinol  (ZYLOPRIM ) 100 MG tablet Take 100 mg by mouth 2 (two) times daily.    [provider]  amLODipine  (NORVASC ) 10 MG tablet Take 1 tablet (10 mg total) by mouth daily. 01/28/24   Thukkani, Arun K, MD  atorvastatin  (LIPITOR) 80 MG tablet  Take 1 tablet (80 mg total) by mouth daily. 01/04/24   Thukkani, Arun K, MD  calcium  gluconate 500 MG tablet Take 2 tablets in the morning & take 1 tablet at night.    [provider]  carvedilol  (COREG ) 6.25 MG tablet Take 1 tablet (6.25 mg total) by mouth 2 (two) times daily. 03/16/23   Thukkani, Arun K, MD  Cholecalciferol (VITAMIN D ) 2000 UNITS tablet Take 2,000 Units by mouth daily.    [provider]  cyanocobalamin  (VITAMIN B12) 1000 MCG/ML injection Inject 1,000 mcg into the muscle every 30 (thirty) days.    [provider]  diltiazem  (CARDIZEM ) 30 MG tablet Take 1 tablet (30 mg total) by mouth 2 (two) times daily as needed (for heart rate greater than 110). 10/30/22   Gerald Kitty., NP  ELIQUIS  5 MG TABS tablet TAKE 1 TABLET BY MOUTH TWICE A DAY 11/03/23   Fenton, Clint R, PA  empagliflozin  (JARDIANCE ) 10 MG TABS tablet Take 1 tablet (10 mg total) by mouth daily before breakfast. 11/05/23   Thukkani, Arun K, MD  empagliflozin  (JARDIANCE ) 10 MG TABS tablet Take 1 tablet (10 mg total) by mouth daily before breakfast. 11/05/23   Thukkani, Arun K, MD  fexofenadine (ALLEGRA) 180 MG tablet Take 180 mg by mouth in the morning.    [provider]  furosemide  (LASIX ) 20 MG tablet Take 20 mg oral daily for next 5 days, then as needed for edema or fluid retention 09/29/22   Elgergawy, Ardia Kraft, MD  lisinopril (ZESTRIL) 40 MG tablet Take 40 mg by mouth in the morning. 11/26/22   [provider]  Multiple Vitamin (MULTIVITAMIN) tablet Take 1 tablet by mouth in the morning.    [provider]  nitroGLYCERIN  (NITROSTAT ) 0.4 MG SL tablet Place 1 tablet (0.4 mg total) under the tongue every 5 (five) minutes as needed for chest pain. 07/18/22   Arty Binning, MD  NON FORMULARY Place 1 Application in ear(s) 2 (two) times a week. Chloramphenicol 50 mg /amphotericin B 5 mg (Tuesdays & Fridays)    [provider]  Omega-3 Fatty Acids (FISH OIL) 1200 MG CAPS  Take 1,200 mg by mouth 2 (two) times daily.    [provider]  sildenafil (VIAGRA) 100 MG tablet 1 tablet as needed Orally Once a day for 30 day(s) 10/20/23   [provider]    Physical Exam    Vital Signs:  Erik Perez does not have vital signs available for review today.  Given telephonic nature of communication, physical exam is limited. AAOx3. NAD. Normal affect.  Speech and respirations are unlabored.   Assessment & Plan    Primary Cardiologist: Arun K Thukkani, MD  Preoperative cardiovascular risk assessment.  Transrectal ultrasound prostate biopsy by Dr. Secundino Dach on 02/15/2024.  Chart reviewed as part of pre-operative protocol coverage. According to the RCRI, patient has a 0.9% risk of MACE. Patient reports activity equivalent to >4.0 METS (walking for exercise weather permitting, light to moderate household chores).  Given past medical history and time since last visit, based on ACC/AHA guidelines, CADEL OVERMAN would be at acceptable risk for the planned procedure without further cardiovascular testing.   Patient was advised that if he develops new symptoms prior to surgery to contact our office to arrange a follow-up appointment.  he verbalized understanding.  Per Pharm D, patient may hold Eliquis  for 3 days prior to procedure.    I will route this recommendation to the requesting party via Epic fax function.  Please call with questions.  Time:   Today, I have spent 14 minutes with the patient with telehealth technology discussing medical history, symptoms, and management plan.     Morey Ar, NP  02/09/2024, 7:29 AM

## 2024-02-15 DIAGNOSIS — C61 Malignant neoplasm of prostate: Secondary | ICD-10-CM | POA: Diagnosis not present

## 2024-02-17 DIAGNOSIS — G4733 Obstructive sleep apnea (adult) (pediatric): Secondary | ICD-10-CM | POA: Diagnosis not present

## 2024-02-17 DIAGNOSIS — N184 Chronic kidney disease, stage 4 (severe): Secondary | ICD-10-CM | POA: Diagnosis not present

## 2024-02-23 DIAGNOSIS — N5201 Erectile dysfunction due to arterial insufficiency: Secondary | ICD-10-CM | POA: Diagnosis not present

## 2024-02-23 DIAGNOSIS — N183 Chronic kidney disease, stage 3 unspecified: Secondary | ICD-10-CM | POA: Diagnosis not present

## 2024-02-23 DIAGNOSIS — C61 Malignant neoplasm of prostate: Secondary | ICD-10-CM | POA: Diagnosis not present

## 2024-02-23 DIAGNOSIS — H6123 Impacted cerumen, bilateral: Secondary | ICD-10-CM | POA: Diagnosis not present

## 2024-02-23 DIAGNOSIS — N281 Cyst of kidney, acquired: Secondary | ICD-10-CM | POA: Diagnosis not present

## 2024-02-24 DIAGNOSIS — E538 Deficiency of other specified B group vitamins: Secondary | ICD-10-CM | POA: Diagnosis not present

## 2024-02-25 ENCOUNTER — Other Ambulatory Visit: Payer: Self-pay | Admitting: Internal Medicine

## 2024-02-25 DIAGNOSIS — N184 Chronic kidney disease, stage 4 (severe): Secondary | ICD-10-CM | POA: Diagnosis not present

## 2024-02-25 DIAGNOSIS — D631 Anemia in chronic kidney disease: Secondary | ICD-10-CM | POA: Diagnosis not present

## 2024-02-25 DIAGNOSIS — I129 Hypertensive chronic kidney disease with stage 1 through stage 4 chronic kidney disease, or unspecified chronic kidney disease: Secondary | ICD-10-CM | POA: Diagnosis not present

## 2024-02-25 DIAGNOSIS — N2581 Secondary hyperparathyroidism of renal origin: Secondary | ICD-10-CM | POA: Diagnosis not present

## 2024-03-08 DIAGNOSIS — C61 Malignant neoplasm of prostate: Secondary | ICD-10-CM | POA: Diagnosis not present

## 2024-03-08 DIAGNOSIS — I4891 Unspecified atrial fibrillation: Secondary | ICD-10-CM | POA: Diagnosis not present

## 2024-03-08 DIAGNOSIS — R7303 Prediabetes: Secondary | ICD-10-CM | POA: Diagnosis not present

## 2024-03-08 DIAGNOSIS — E538 Deficiency of other specified B group vitamins: Secondary | ICD-10-CM | POA: Diagnosis not present

## 2024-03-08 DIAGNOSIS — N184 Chronic kidney disease, stage 4 (severe): Secondary | ICD-10-CM | POA: Diagnosis not present

## 2024-03-08 DIAGNOSIS — E7800 Pure hypercholesterolemia, unspecified: Secondary | ICD-10-CM | POA: Diagnosis not present

## 2024-03-19 DIAGNOSIS — G4733 Obstructive sleep apnea (adult) (pediatric): Secondary | ICD-10-CM | POA: Diagnosis not present

## 2024-04-12 DIAGNOSIS — L918 Other hypertrophic disorders of the skin: Secondary | ICD-10-CM | POA: Diagnosis not present

## 2024-04-12 DIAGNOSIS — I872 Venous insufficiency (chronic) (peripheral): Secondary | ICD-10-CM | POA: Diagnosis not present

## 2024-04-12 DIAGNOSIS — I8312 Varicose veins of left lower extremity with inflammation: Secondary | ICD-10-CM | POA: Diagnosis not present

## 2024-04-12 DIAGNOSIS — L245 Irritant contact dermatitis due to other chemical products: Secondary | ICD-10-CM | POA: Diagnosis not present

## 2024-04-12 DIAGNOSIS — D2271 Melanocytic nevi of right lower limb, including hip: Secondary | ICD-10-CM | POA: Diagnosis not present

## 2024-04-12 DIAGNOSIS — L820 Inflamed seborrheic keratosis: Secondary | ICD-10-CM | POA: Diagnosis not present

## 2024-04-12 DIAGNOSIS — D485 Neoplasm of uncertain behavior of skin: Secondary | ICD-10-CM | POA: Diagnosis not present

## 2024-04-12 DIAGNOSIS — I8311 Varicose veins of right lower extremity with inflammation: Secondary | ICD-10-CM | POA: Diagnosis not present

## 2024-04-12 DIAGNOSIS — D0461 Carcinoma in situ of skin of right upper limb, including shoulder: Secondary | ICD-10-CM | POA: Diagnosis not present

## 2024-04-12 DIAGNOSIS — L738 Other specified follicular disorders: Secondary | ICD-10-CM | POA: Diagnosis not present

## 2024-04-12 DIAGNOSIS — L821 Other seborrheic keratosis: Secondary | ICD-10-CM | POA: Diagnosis not present

## 2024-04-18 DIAGNOSIS — G4733 Obstructive sleep apnea (adult) (pediatric): Secondary | ICD-10-CM | POA: Diagnosis not present

## 2024-04-26 DIAGNOSIS — D631 Anemia in chronic kidney disease: Secondary | ICD-10-CM | POA: Diagnosis not present

## 2024-04-26 DIAGNOSIS — I129 Hypertensive chronic kidney disease with stage 1 through stage 4 chronic kidney disease, or unspecified chronic kidney disease: Secondary | ICD-10-CM | POA: Diagnosis not present

## 2024-04-26 DIAGNOSIS — N2581 Secondary hyperparathyroidism of renal origin: Secondary | ICD-10-CM | POA: Diagnosis not present

## 2024-04-26 DIAGNOSIS — N189 Chronic kidney disease, unspecified: Secondary | ICD-10-CM | POA: Diagnosis not present

## 2024-04-26 DIAGNOSIS — N184 Chronic kidney disease, stage 4 (severe): Secondary | ICD-10-CM | POA: Diagnosis not present

## 2024-04-27 DIAGNOSIS — D0461 Carcinoma in situ of skin of right upper limb, including shoulder: Secondary | ICD-10-CM | POA: Diagnosis not present

## 2024-05-06 ENCOUNTER — Encounter (INDEPENDENT_AMBULATORY_CARE_PROVIDER_SITE_OTHER): Payer: Medicare PPO | Admitting: Ophthalmology

## 2024-05-06 DIAGNOSIS — H353122 Nonexudative age-related macular degeneration, left eye, intermediate dry stage: Secondary | ICD-10-CM | POA: Diagnosis not present

## 2024-05-06 DIAGNOSIS — H43813 Vitreous degeneration, bilateral: Secondary | ICD-10-CM

## 2024-05-06 DIAGNOSIS — H348312 Tributary (branch) retinal vein occlusion, right eye, stable: Secondary | ICD-10-CM

## 2024-05-06 DIAGNOSIS — H35033 Hypertensive retinopathy, bilateral: Secondary | ICD-10-CM | POA: Diagnosis not present

## 2024-05-06 DIAGNOSIS — I10 Essential (primary) hypertension: Secondary | ICD-10-CM | POA: Diagnosis not present

## 2024-05-19 DIAGNOSIS — G4733 Obstructive sleep apnea (adult) (pediatric): Secondary | ICD-10-CM | POA: Diagnosis not present

## 2024-05-25 DIAGNOSIS — H52223 Regular astigmatism, bilateral: Secondary | ICD-10-CM | POA: Diagnosis not present

## 2024-05-25 DIAGNOSIS — H6123 Impacted cerumen, bilateral: Secondary | ICD-10-CM | POA: Diagnosis not present

## 2024-05-25 DIAGNOSIS — H353132 Nonexudative age-related macular degeneration, bilateral, intermediate dry stage: Secondary | ICD-10-CM | POA: Diagnosis not present

## 2024-05-25 DIAGNOSIS — H524 Presbyopia: Secondary | ICD-10-CM | POA: Diagnosis not present

## 2024-05-25 DIAGNOSIS — H5201 Hypermetropia, right eye: Secondary | ICD-10-CM | POA: Diagnosis not present

## 2024-05-25 DIAGNOSIS — H35033 Hypertensive retinopathy, bilateral: Secondary | ICD-10-CM | POA: Diagnosis not present

## 2024-05-25 DIAGNOSIS — H26493 Other secondary cataract, bilateral: Secondary | ICD-10-CM | POA: Diagnosis not present

## 2024-06-08 DIAGNOSIS — Z23 Encounter for immunization: Secondary | ICD-10-CM | POA: Diagnosis not present

## 2024-06-08 DIAGNOSIS — E538 Deficiency of other specified B group vitamins: Secondary | ICD-10-CM | POA: Diagnosis not present

## 2024-06-19 DIAGNOSIS — G4733 Obstructive sleep apnea (adult) (pediatric): Secondary | ICD-10-CM | POA: Diagnosis not present

## 2024-07-02 ENCOUNTER — Other Ambulatory Visit (HOSPITAL_COMMUNITY): Payer: Self-pay | Admitting: Physician Assistant

## 2024-07-19 DIAGNOSIS — G4733 Obstructive sleep apnea (adult) (pediatric): Secondary | ICD-10-CM | POA: Diagnosis not present

## 2024-07-19 DIAGNOSIS — N184 Chronic kidney disease, stage 4 (severe): Secondary | ICD-10-CM | POA: Diagnosis not present

## 2024-07-20 ENCOUNTER — Ambulatory Visit (HOSPITAL_COMMUNITY)
Admission: RE | Admit: 2024-07-20 | Discharge: 2024-07-20 | Disposition: A | Discharge status: Home / Self Care | Source: Ambulatory Visit | Attending: Internal Medicine | Admitting: Internal Medicine

## 2024-07-20 ENCOUNTER — Encounter (HOSPITAL_COMMUNITY): Payer: Self-pay | Admitting: Internal Medicine

## 2024-07-20 VITALS — BP 148/68 | HR 57 | Ht 71.0 in | Wt 222.6 lb

## 2024-07-20 DIAGNOSIS — D6869 Other thrombophilia: Secondary | ICD-10-CM | POA: Diagnosis not present

## 2024-07-20 DIAGNOSIS — I4819 Other persistent atrial fibrillation: Secondary | ICD-10-CM | POA: Diagnosis not present

## 2024-07-20 MED ORDER — APIXABAN 5 MG PO TABS: 5.0000 mg | ORAL_TABLET | Freq: Two times a day (BID) | ORAL | 11 refills | Status: AC

## 2024-07-20 NOTE — Addendum Note (Signed)
 Encounter addended by: Janel Nancy SAUNDERS, RN on: 07/20/2024 11:00 AM  Actions taken: Pharmacy for encounter modified, Order list changed

## 2024-07-20 NOTE — Progress Notes (Signed)
 Primary Care Physician: [name], DO Primary Cardiologist: Dr Claudene Primary Electrophysiologist: none Referring Physician: [facility]   [name] is a 78 y.o. male with a history of CAD s/p CABG, HTN, OSA, prostate CA, HLD, carotid artery disease, atrial fibrillation who presents for consultation in the [facility].  The patient was initially diagnosed with atrial fibrillation 07/10/22 after presenting to the ED with symptoms of tachypalpitations and chest pain. ECG showed new onset afib. His home metoprolol  was increased for rate control and he was started on Eliquis  for a CHADS2VASC score of 4. Patient reports compliance with his CPAP and denies significant alcohol use. There were no specific triggers for his afib that he could identify. He is back in SR today.   F/u visit form ED 08/20/22. He presented to the ED with onset of afib with chest pain. He was compliant with BB and eliquis . H is currently wearing a heart monitor and is scheduled for a stress test as outpt 09/05/22. He did have a successful cardioversion and cardiology recommend starting him on po amiodarone .AAD choices are limited 2/2 CKD. He is compliant with CPAP, no alcohol use.   In the afib clinic, he is in SR and is continuing on amiodarone  400 mg bid and will go to 400 mg a day per instruction tomorrow. I am changing this to 1/2 tab of 400 mg bid for 2 more  weeks then go to 200 mg daily.  He has f/u with Jackee Alberts, NP, 12/7. We discussed he may be an ablation candidate so he will not have to be on amiodarone  long term and will request appointment.   On follow up 03/11/23, he is currently in NSR. He is s/p Afib ablation by Dr. Inocencio on 02/11/23. He denies any chest pain or shortness of breath or trouble swallowing. He has not had any episodes of Afib since ablation. Leg sites healed without issue. Currently on amiodarone  200 mg daily. No missed doses of Eliquis .   On follow up  07/20/24, patient is currently in NSR. He has had overall no Afib burden since last office visit. No bleeding issues on Eliquis .   Today, he denies symptoms of palpitations, orthopnea, PND, lower extremity edema, dizziness, presyncope, syncope, bleeding, or neurologic sequela. The patient is tolerating medications without difficulties and is otherwise without complaint today.    Atrial Fibrillation Risk Factors:  he does have symptoms or diagnosis of sleep apnea. he is compliant with CPAP therapy. he does not have a history of rheumatic fever. he does not have a history of alcohol use.   he has a BMI of Body mass index is 31.05 kg/m.SABRA Filed Weights   07/20/24 1027  Weight: 101 kg     Family History  Problem Relation Age of Onset   Heart disease Mother    Heart disease Father    Heart disease Brother      Atrial Fibrillation Management history:  Previous antiarrhythmic drugs: none Previous cardioversions: none Previous ablations: none CHADS2VASC score: 4 Anticoagulation history: Eliquis    Past Medical History:  Diagnosis Date   Carotid artery obstruction    Bilateral moderate carotid obstruction 2013 doppler   Coronary artery disease    Hyperlipidemia    Hypertension    OSA (obstructive sleep apnea)    Split 02-08-07 AHI total 32/hr; REM 60/hr O2 sat min NREM 88% REM 88%   Past Surgical History:  Procedure Laterality Date   ATRIAL FIBRILLATION ABLATION N/A  02/11/2023   Procedure: ATRIAL FIBRILLATION ABLATION;  Surgeon: Inocencio Soyla Lunger, MD;  Location: [facility] CV [facility];  Service: Cardiovascular;  Laterality: N/A;   CERVICAL DISC SURGERY  2009   CHOLECYSTECTOMY N/A 09/23/2022   Procedure: LAPAROSCOPIC CHOLECYSTECTOMY;  Surgeon: Lyndel Deward PARAS, MD;  Location: [facility] OR;  Service: General;  Laterality: N/A;   CORONARY ARTERY BYPASS GRAFT     [place], to LAD, RIMA to RCA 2000,Cfx. Taxus stent 2002    Current Outpatient Medications  Medication Sig Dispense  Refill   acetaminophen  (TYLENOL ) 500 MG tablet Take 500-1,000 mg by mouth every 6 (six) hours as needed (pain.).     allopurinol  (ZYLOPRIM ) 100 MG tablet Take 100 mg by mouth 2 (two) times daily.     amLODipine  (NORVASC ) 10 MG tablet Take 1 tablet (10 mg total) by mouth daily. 90 tablet 3   apixaban  (ELIQUIS ) 5 MG TABS tablet Take 1 tablet (5 mg total) by mouth 2 (two) times daily. Need appt for refills 60 tablet 0   atorvastatin  (LIPITOR) 80 MG tablet Take 1 tablet (80 mg total) by mouth daily. 90 tablet 3   calcium  gluconate 500 MG tablet Take 2 tablets in the morning & take 1 tablet at night.     carvedilol  (COREG ) 6.25 MG tablet TAKE 1 TABLET BY MOUTH TWICE A DAY 180 tablet 2   Cholecalciferol (VITAMIN D ) 2000 UNITS tablet Take 2,000 Units by mouth daily.     cyanocobalamin  (VITAMIN B12) 1000 MCG/ML injection Inject 1,000 mcg into the muscle every 30 (thirty) days.     diltiazem  (CARDIZEM ) 30 MG tablet Take 1 tablet (30 mg total) by mouth 2 (two) times daily as needed (for heart rate greater than 110).     empagliflozin  (JARDIANCE ) 10 MG TABS tablet Take 1 tablet (10 mg total) by mouth daily before breakfast. 30 tablet 11   empagliflozin  (JARDIANCE ) 10 MG TABS tablet Take 1 tablet (10 mg total) by mouth daily before breakfast. 28 tablet 0   fexofenadine (ALLEGRA) 180 MG tablet Take 180 mg by mouth in the morning.     furosemide  (LASIX ) 20 MG tablet Take 20 mg oral daily for next 5 days, then as needed for edema or fluid retention 30 tablet 0   lisinopril (ZESTRIL) 40 MG tablet Take 40 mg by mouth in the morning.     Multiple Vitamin (MULTIVITAMIN) tablet Take 1 tablet by mouth in the morning.     nitroGLYCERIN  (NITROSTAT ) 0.4 MG SL tablet Place 1 tablet (0.4 mg total) under the tongue every 5 (five) minutes as needed for chest pain. 25 tablet 1   NON FORMULARY Place 1 Application in ear(s) 2 (two) times a week. Chloramphenicol 50 mg /amphotericin B 5 mg (Tuesdays & Fridays)     Omega-3 Fatty  Acids (FISH OIL) 1200 MG CAPS Take 1,200 mg by mouth 2 (two) times daily.     sildenafil (VIAGRA) 100 MG tablet 1 tablet as needed Orally Once a day for 30 day(s)     No current facility-administered medications for this encounter.    Allergies  Allergen Reactions   Colchicine Other (See Comments)    Unknown reaction.   Diclofenac Sodium     Chest Pain   Gabapentin     Increased sedation    Niaspan [Niacin Er (Antihyperlipidemic)]     Involuntary facial flushing   ROS- All systems are reviewed and negative except as per the HPI above.  Physical Exam: Vitals:   07/20/24 1027  BP: (!) 148/68  Pulse: (!) 57  Weight: 101 kg  Height: 5' 11 (1.803 m)    GEN- The patient is well appearing, alert and oriented x 3 today.   Neck - no JVD or carotid bruit noted Lungs- Clear to ausculation bilaterally, normal work of breathing Heart- Regular rate and rhythm, no murmurs, rubs or gallops, PMI not laterally displaced Extremities- no clubbing, cyanosis, or edema Skin - no rash or ecchymosis noted   Wt Readings from Last 3 Encounters:  07/20/24 101 kg  11/05/23 99.5 kg  05/29/23 97.4 kg    EKG today demonstrates  Vent. rate 57 BPM PR interval 112 ms QRS duration 90 ms QT/QTcB 376/365 ms P-R-T axes 42 87 110 Sinus bradycardia Septal infarct (cited on or before 11-Feb-2023) Abnormal ECG When compared with ECG of 29-May-2023 15:17, Previous ECG is present  Echo 04/11/22 demonstrated   1. Left ventricular ejection fraction, by estimation, is 60 to 65%. Left  ventricular ejection fraction by 3D volume is 60 %. The left ventricle has normal function. The left ventricle has no regional wall motion  abnormalities. Left ventricular diastolic parameters were normal.   2. Right ventricular systolic function is normal. The right ventricular  size is normal. Tricuspid regurgitation signal is inadequate for assessing PA pressure.   3. The mitral valve is normal in structure. No evidence  of mitral valve  regurgitation. No evidence of mitral stenosis.   4. The aortic valve is tricuspid. Aortic valve regurgitation is not  visualized. Aortic valve sclerosis/calcification is present, without any  evidence of aortic stenosis.   5. The inferior vena cava is normal in size with greater than 50%  respiratory variability, suggesting right atrial pressure of 3 mmHg.   Epic records are reviewed at length today  CHA2DS2-VASc Score = 4  The patient's score is based upon: CHF History: 0 HTN History: 1 Diabetes History: 0 Stroke History: 0 Vascular Disease History: 1 Age Score: 2 Gender Score: 0      ASSESSMENT AND PLAN: Persistent Atrial Fibrillation (ICD10:  I48.0) The patient's CHA2DS2-VASc score is 4, indicating a 4.8% annual risk of stroke.   S/p Afib ablation by Dr. Inocencio on 02/11/23.  Patient is currently in NSR. He has been doing well since ablation. Will not make any changes to current regimen at this tim.e  Secondary Hypercoagulable State (ICD10:  D68.69) The patient is at significant risk for stroke/thromboembolism based upon his CHA2DS2-VASc Score of 4.  Continue Apixaban  (Eliquis ).  No missed doses.   CAD S/p CABG  HTN Patient keeps meticulous BP recordings and shows me excellent BP readings at home. Continue to monitor.   Follow up 1 year Afib clinic.  Fairy Heinrich, [facility] [address] [place], [place] [address] [phone]

## 2024-07-25 DIAGNOSIS — I129 Hypertensive chronic kidney disease with stage 1 through stage 4 chronic kidney disease, or unspecified chronic kidney disease: Secondary | ICD-10-CM | POA: Diagnosis not present

## 2024-07-25 DIAGNOSIS — D631 Anemia in chronic kidney disease: Secondary | ICD-10-CM | POA: Diagnosis not present

## 2024-07-25 DIAGNOSIS — N184 Chronic kidney disease, stage 4 (severe): Secondary | ICD-10-CM | POA: Diagnosis not present

## 2024-07-25 DIAGNOSIS — N2581 Secondary hyperparathyroidism of renal origin: Secondary | ICD-10-CM | POA: Diagnosis not present

## 2024-08-08 ENCOUNTER — Encounter: Payer: Self-pay | Admitting: Cardiology

## 2024-08-09 DIAGNOSIS — M1711 Unilateral primary osteoarthritis, right knee: Secondary | ICD-10-CM | POA: Diagnosis not present

## 2024-08-09 DIAGNOSIS — M1712 Unilateral primary osteoarthritis, left knee: Secondary | ICD-10-CM | POA: Diagnosis not present

## 2024-08-09 DIAGNOSIS — M170 Bilateral primary osteoarthritis of knee: Secondary | ICD-10-CM | POA: Diagnosis not present

## 2024-08-10 MED ORDER — DILTIAZEM HCL 30 MG PO TABS: 30.0000 mg | ORAL_TABLET | Freq: Two times a day (BID) | ORAL | 3 refills | Status: AC | PRN

## 2024-08-15 DIAGNOSIS — C61 Malignant neoplasm of prostate: Secondary | ICD-10-CM | POA: Diagnosis not present

## 2024-08-22 DIAGNOSIS — N1831 Chronic kidney disease, stage 3a: Secondary | ICD-10-CM | POA: Diagnosis not present

## 2024-08-22 DIAGNOSIS — N5201 Erectile dysfunction due to arterial insufficiency: Secondary | ICD-10-CM | POA: Diagnosis not present

## 2024-08-22 DIAGNOSIS — N281 Cyst of kidney, acquired: Secondary | ICD-10-CM | POA: Diagnosis not present

## 2024-08-22 DIAGNOSIS — C61 Malignant neoplasm of prostate: Secondary | ICD-10-CM | POA: Diagnosis not present

## 2024-08-30 DIAGNOSIS — Z974 Presence of external hearing-aid: Secondary | ICD-10-CM | POA: Diagnosis not present

## 2024-08-30 DIAGNOSIS — M25561 Pain in right knee: Secondary | ICD-10-CM | POA: Diagnosis not present

## 2024-08-30 DIAGNOSIS — H6123 Impacted cerumen, bilateral: Secondary | ICD-10-CM | POA: Diagnosis not present

## 2024-10-18 ENCOUNTER — Encounter: Payer: Self-pay | Admitting: Internal Medicine

## 2024-11-16 ENCOUNTER — Ambulatory Visit: Admitting: Internal Medicine

## 2025-05-08 ENCOUNTER — Encounter (INDEPENDENT_AMBULATORY_CARE_PROVIDER_SITE_OTHER): Admitting: Ophthalmology
